# Patient Record
Sex: Female | Born: 1940
Health system: Southern US, Community
[De-identification: ages and names within clinical notes are randomized; demographics above are authoritative.]

## PROBLEM LIST (undated history)

## (undated) DIAGNOSIS — B182 Chronic viral hepatitis C: Secondary | ICD-10-CM

## (undated) DIAGNOSIS — I10 Essential (primary) hypertension: Secondary | ICD-10-CM

## (undated) DIAGNOSIS — Z9289 Personal history of other medical treatment: Secondary | ICD-10-CM

## (undated) DIAGNOSIS — J189 Pneumonia, unspecified organism: Secondary | ICD-10-CM

## (undated) DIAGNOSIS — R002 Palpitations: Secondary | ICD-10-CM

## (undated) DIAGNOSIS — E785 Hyperlipidemia, unspecified: Secondary | ICD-10-CM

## (undated) DIAGNOSIS — R519 Headache, unspecified: Secondary | ICD-10-CM

## (undated) DIAGNOSIS — R51 Headache: Secondary | ICD-10-CM

## (undated) DIAGNOSIS — M1990 Unspecified osteoarthritis, unspecified site: Secondary | ICD-10-CM

## (undated) DIAGNOSIS — E042 Nontoxic multinodular goiter: Secondary | ICD-10-CM

## (undated) HISTORY — PX: TUBAL LIGATION: SHX77

---

## 1988-03-24 DIAGNOSIS — Z9289 Personal history of other medical treatment: Secondary | ICD-10-CM

## 1988-03-24 HISTORY — DX: Personal history of other medical treatment: Z92.89

## 1997-12-06 ENCOUNTER — Encounter: Payer: Self-pay | Admitting: Gastroenterology

## 1997-12-06 ENCOUNTER — Ambulatory Visit (HOSPITAL_COMMUNITY): Admission: RE | Admit: 1997-12-06 | Discharge: 1997-12-06 | Payer: Self-pay | Admitting: Gastroenterology

## 1997-12-27 ENCOUNTER — Other Ambulatory Visit: Admission: RE | Admit: 1997-12-27 | Discharge: 1997-12-27 | Payer: Self-pay | Admitting: Obstetrics and Gynecology

## 1999-01-10 ENCOUNTER — Other Ambulatory Visit: Admission: RE | Admit: 1999-01-10 | Discharge: 1999-01-10 | Payer: Self-pay | Admitting: Obstetrics and Gynecology

## 1999-02-25 ENCOUNTER — Encounter: Admission: RE | Admit: 1999-02-25 | Discharge: 1999-02-25 | Payer: Self-pay | Admitting: Family Medicine

## 1999-04-15 ENCOUNTER — Encounter: Payer: Self-pay | Admitting: Sports Medicine

## 1999-04-15 ENCOUNTER — Encounter: Admission: RE | Admit: 1999-04-15 | Discharge: 1999-04-15 | Payer: Self-pay | Admitting: Sports Medicine

## 1999-06-14 ENCOUNTER — Encounter (INDEPENDENT_AMBULATORY_CARE_PROVIDER_SITE_OTHER): Payer: Self-pay

## 1999-06-14 ENCOUNTER — Other Ambulatory Visit: Admission: RE | Admit: 1999-06-14 | Discharge: 1999-06-14 | Payer: Self-pay | Admitting: Obstetrics and Gynecology

## 2000-02-06 ENCOUNTER — Other Ambulatory Visit: Admission: RE | Admit: 2000-02-06 | Discharge: 2000-02-06 | Payer: Self-pay | Admitting: Obstetrics and Gynecology

## 2000-10-13 ENCOUNTER — Ambulatory Visit (HOSPITAL_COMMUNITY): Admission: RE | Admit: 2000-10-13 | Discharge: 2000-10-13 | Payer: Self-pay | Admitting: Gastroenterology

## 2000-10-13 ENCOUNTER — Encounter: Payer: Self-pay | Admitting: Gastroenterology

## 2000-10-13 ENCOUNTER — Encounter (INDEPENDENT_AMBULATORY_CARE_PROVIDER_SITE_OTHER): Payer: Self-pay | Admitting: *Deleted

## 2000-10-16 ENCOUNTER — Ambulatory Visit (HOSPITAL_COMMUNITY): Admission: RE | Admit: 2000-10-16 | Discharge: 2000-10-16 | Payer: Self-pay | Admitting: Gastroenterology

## 2001-02-08 ENCOUNTER — Other Ambulatory Visit: Admission: RE | Admit: 2001-02-08 | Discharge: 2001-02-08 | Payer: Self-pay | Admitting: Obstetrics and Gynecology

## 2001-04-06 ENCOUNTER — Encounter: Payer: Self-pay | Admitting: Family Medicine

## 2001-04-06 ENCOUNTER — Encounter: Admission: RE | Admit: 2001-04-06 | Discharge: 2001-04-06 | Payer: Self-pay | Admitting: Family Medicine

## 2002-10-03 ENCOUNTER — Ambulatory Visit (HOSPITAL_BASED_OUTPATIENT_CLINIC_OR_DEPARTMENT_OTHER): Admission: RE | Admit: 2002-10-03 | Discharge: 2002-10-03 | Payer: Self-pay | Admitting: Surgery

## 2002-10-03 ENCOUNTER — Encounter (INDEPENDENT_AMBULATORY_CARE_PROVIDER_SITE_OTHER): Payer: Self-pay | Admitting: Specialist

## 2002-11-16 ENCOUNTER — Encounter (INDEPENDENT_AMBULATORY_CARE_PROVIDER_SITE_OTHER): Payer: Self-pay | Admitting: *Deleted

## 2002-11-16 ENCOUNTER — Ambulatory Visit (HOSPITAL_COMMUNITY): Admission: RE | Admit: 2002-11-16 | Discharge: 2002-11-16 | Payer: Self-pay | Admitting: Gastroenterology

## 2002-12-22 ENCOUNTER — Encounter: Payer: Self-pay | Admitting: Cardiology

## 2002-12-22 ENCOUNTER — Ambulatory Visit (HOSPITAL_COMMUNITY): Admission: RE | Admit: 2002-12-22 | Discharge: 2002-12-22 | Payer: Self-pay | Admitting: Cardiology

## 2003-07-21 ENCOUNTER — Encounter: Admission: RE | Admit: 2003-07-21 | Discharge: 2003-07-21 | Payer: Self-pay | Admitting: Infectious Diseases

## 2004-03-21 ENCOUNTER — Emergency Department (HOSPITAL_COMMUNITY): Admission: EM | Admit: 2004-03-21 | Discharge: 2004-03-21 | Payer: Self-pay | Admitting: Family Medicine

## 2005-01-28 ENCOUNTER — Ambulatory Visit: Payer: Self-pay | Admitting: Cardiology

## 2005-02-28 ENCOUNTER — Ambulatory Visit: Payer: Self-pay | Admitting: Gastroenterology

## 2005-05-12 ENCOUNTER — Ambulatory Visit (HOSPITAL_COMMUNITY): Admission: RE | Admit: 2005-05-12 | Discharge: 2005-05-12 | Payer: Self-pay | Admitting: "Endocrinology

## 2006-02-05 ENCOUNTER — Encounter: Admission: RE | Admit: 2006-02-05 | Discharge: 2006-02-05 | Payer: Self-pay | Admitting: Sports Medicine

## 2006-06-08 ENCOUNTER — Emergency Department (HOSPITAL_COMMUNITY): Admission: EM | Admit: 2006-06-08 | Discharge: 2006-06-09 | Payer: Self-pay | Admitting: Emergency Medicine

## 2006-07-27 ENCOUNTER — Ambulatory Visit: Payer: Self-pay | Admitting: Internal Medicine

## 2006-08-18 ENCOUNTER — Ambulatory Visit: Payer: Self-pay | Admitting: Internal Medicine

## 2006-08-18 ENCOUNTER — Encounter: Payer: Self-pay | Admitting: Internal Medicine

## 2006-08-18 ENCOUNTER — Ambulatory Visit: Payer: Self-pay

## 2007-02-09 ENCOUNTER — Ambulatory Visit: Payer: Self-pay | Admitting: Internal Medicine

## 2007-04-07 ENCOUNTER — Ambulatory Visit: Payer: Self-pay | Admitting: Internal Medicine

## 2007-04-07 LAB — CONVERTED CEMR LAB
ALT: 28 units/L (ref 0–35)
Alkaline Phosphatase: 75 units/L (ref 39–117)
Bilirubin, Direct: 0.2 mg/dL (ref 0.0–0.3)
CO2: 29 meq/L (ref 19–32)
Calcium: 9.1 mg/dL (ref 8.4–10.5)
Chloride: 105 meq/L (ref 96–112)
Cholesterol: 209 mg/dL (ref 0–200)
Creatinine, Ser: 0.6 mg/dL (ref 0.4–1.2)
Eosinophils Relative: 2.5 % (ref 0.0–5.0)
Free T4: 1.4 ng/dL (ref 0.6–1.6)
GFR calc Af Amer: 129 mL/min
Glucose, Bld: 95 mg/dL (ref 70–99)
HDL: 41.2 mg/dL (ref >39.0)
Hemoglobin: 12.9 g/dL (ref 12.0–15.0)
Lymphocytes Relative: 38.6 % (ref 12.0–46.0)
MCHC: 34.3 g/dL (ref 30.0–36.0)
MCV: 90.3 fL (ref 78.0–100.0)
Neutrophils Relative %: 51.6 % (ref 43.0–77.0)
Potassium: 3.9 meq/L (ref 3.5–5.1)
RBC: 4.15 M/uL (ref 3.87–5.11)
RDW: 12.3 % (ref 11.5–14.6)
Total CHOL/HDL Ratio: 5.1
VLDL: 11 mg/dL (ref 0–40)
WBC: 3.7 10*3/uL — ABNORMAL LOW (ref 4.5–10.5)

## 2008-03-02 ENCOUNTER — Ambulatory Visit: Payer: Self-pay | Admitting: Internal Medicine

## 2008-12-08 ENCOUNTER — Encounter: Admission: RE | Admit: 2008-12-08 | Discharge: 2008-12-08 | Payer: Self-pay | Admitting: Sports Medicine

## 2008-12-29 ENCOUNTER — Encounter: Admission: RE | Admit: 2008-12-29 | Discharge: 2008-12-29 | Payer: Self-pay | Admitting: Sports Medicine

## 2009-01-03 ENCOUNTER — Telehealth: Payer: Self-pay | Admitting: *Deleted

## 2009-01-03 ENCOUNTER — Encounter (INDEPENDENT_AMBULATORY_CARE_PROVIDER_SITE_OTHER): Payer: Self-pay | Admitting: *Deleted

## 2009-01-17 ENCOUNTER — Ambulatory Visit: Admission: RE | Admit: 2009-01-17 | Discharge: 2009-01-17 | Payer: Self-pay | Admitting: Family Medicine

## 2009-01-17 ENCOUNTER — Ambulatory Visit: Payer: Self-pay | Admitting: Vascular Surgery

## 2009-01-17 ENCOUNTER — Encounter (INDEPENDENT_AMBULATORY_CARE_PROVIDER_SITE_OTHER): Payer: Self-pay | Admitting: Family Medicine

## 2009-07-03 DIAGNOSIS — R059 Cough, unspecified: Secondary | ICD-10-CM | POA: Insufficient documentation

## 2009-07-03 DIAGNOSIS — R05 Cough: Secondary | ICD-10-CM | POA: Insufficient documentation

## 2009-07-09 ENCOUNTER — Ambulatory Visit: Payer: Self-pay | Admitting: Internal Medicine

## 2009-07-09 DIAGNOSIS — M5412 Radiculopathy, cervical region: Secondary | ICD-10-CM | POA: Insufficient documentation

## 2009-07-09 DIAGNOSIS — R002 Palpitations: Secondary | ICD-10-CM | POA: Insufficient documentation

## 2009-07-09 DIAGNOSIS — M1710 Unilateral primary osteoarthritis, unspecified knee: Secondary | ICD-10-CM | POA: Insufficient documentation

## 2009-07-09 DIAGNOSIS — E785 Hyperlipidemia, unspecified: Secondary | ICD-10-CM

## 2009-07-09 DIAGNOSIS — E042 Nontoxic multinodular goiter: Secondary | ICD-10-CM

## 2009-07-09 DIAGNOSIS — Z8619 Personal history of other infectious and parasitic diseases: Secondary | ICD-10-CM

## 2009-10-24 ENCOUNTER — Ambulatory Visit: Payer: Self-pay | Admitting: Internal Medicine

## 2009-12-05 ENCOUNTER — Encounter: Admission: RE | Admit: 2009-12-05 | Discharge: 2009-12-05 | Payer: Self-pay | Admitting: Sports Medicine

## 2010-04-23 NOTE — Assessment & Plan Note (Signed)
Summary: Office Visit: Acute Cough   Vital Signs:  Patient profile:   70 year old female O2 Sat:      96 % on Room air Temp:     99.0 degrees F (37.22 degrees C) oral Pulse rate:   88 / minute BP sitting:   131 / 81  (left arm) Cuff size:   large  Vitals Entered By: Cynda Familia Duncan Dull) (July 09, 2009 9:10 AM)  O2 Flow:  Room air  Referring Provider:  Ulyess Mort  CC:  Acute Cough.  History of Present Illness: [name] is a 70 y.o. woman who presents with a chief complaint of acute cough X 6 days.  The cough is generally non-productive although on occasion will produce a tannish sputum.  The cough preceeded a feeling of generalized malaise and muscle aches and has been associated with a subjective, but not objective fever.  She also denies any shakes, chills, shortness of breath, chest pain, night sweats, weight loss, significant dysnpea, wheezes, or heart burn.  She has had recent sinus headaches and does have a clear sinus drainage which she does not perceive running posteriorly.  She has had some sick contacts at work with regards to "colds", had a recent negative PPD (June 2010) and a tetenus booster with a pertussis component about 1 month ago.  She also notes a previous history of this coughing fits every few years in the past after a presumed viral infection which can take up to 6 weeks to spontaneously resolve.  This episode feels "wetter" and symptomatically more significant than the others and makes getting a good night sleep impossible.  Thus far, she has taken, sudefed, claritan, motrin, and as needed albuterol MDI with minimal relief in her symptoms.  About 1 week ago she had a significant pain in her knee which was treated with intra-articular steroids just prior to the development of the cough but has not received any perceived benefit outside of the knee from this injection.  She does not smoke, is not on an ACE inhibitor, and has not had any recent CXRs.  She is w/o  other complaints at this time.  Preventive Screening-Counseling & Management  Alcohol-Tobacco     Smoking Status: never  Problems Prior to Update: 1)  Cough  (ICD-786.2) 2)  Hepatitis C, Chronic  (ICD-070.54) 3)  Spondylosis, Cervical, With Radiculopathy  (ICD-723.4) 4)  Osteoarthritis, Knee, Left  (ICD-715.96) 5)  Palpitations, Recurrent  (ICD-785.1) 6)  Hyperlipidemia  (ICD-272.4) 7)  Nontoxic Multinodular Goiter  (ICD-241.1)  Medications Prior to Update: 1)  Detrol La 4 Mg  Cp24 (Tolterodine Tartrate) .... Take 1 Tablet By Mouth Once A Day  Allergies (verified): 1)  ! Macrobid 2)  ! Demerol 3)  ! Pcn  Past History:  Past medical history reviewed for relevance to current acute and chronic problems.  Social History: Smoking Status:  never  Physical Exam  General:  Well-developed,well-nourished,in no acute distress; alert,appropriate and cooperative throughout examination Head:  Normocephalic and atraumatic without obvious abnormalities. Mouth:  Oropharynx without lesions or exudates.  Teeth in good repair.  Uvula appears red, especially at tip. Neck:  No deformities, masses, or tenderness noted. Lungs:  No dullness to percussion.  Normal fremitus.  Right sided focal wheezing near scapula, bibasilar inspiratory crackles, no wheezes, rhonchi, or rales. Cervical Nodes:  No lymphadenopathy noted Psych:  Cognition and judgment appear intact. Alert and cooperative with normal attention span and concentration. No apparent delusions, illusions, hallucinations  Impression & Recommendations:  Problem # 1:  COUGH (ICD-786.2) Acute cough likely secondary to an upper airway cough syndrome from a viral URI.  Differential includes airway irritation from an acute viral bronchitis.  She may benefit from the anticholenergic effects of a first generation antihistamine, the anti-prostiglandin effects of an NSAID, and the bronchodilatory effects of a beta agonist.  There is no indication  for steroids, antibiotics, or a CXR at this time given the history, examination, and working diagnosis although this may change if her course becomes unusual.  She was started on chlorphenirimine 4 mg by mouth Q8H as needed and advised of the potential sleepiness that may occur with this medication.  With this in mind, she may want to take the first dose tonight before bed (which may help her sleep).  She was also asked to purchase Naproxen over the counter and take 500 mg by mouth Q12H as needed with food for both her cough and aches and pains as this NSAID has been found to benefit the cough associated with the common cold.  She was also advised to take Albuterol 2 puffs Q6H as needed for dysnpnea and cough.  Inhaler technique was reviewed and the use of a spacer was stressed.  If there is deterioration in her symptoms, the development of other symptoms including fevers, increased purulence, or no improvement in symptoms she was advised to contact me for re-review of her symptoms and possible additional evaluation.  Otherwise, if her symptoms improved she could follow-up with her primary care provider.  Complete Medication List: 1)  Detrol La 4 Mg Cp24 (Tolterodine tartrate) .... Take 1 tablet by mouth once a day 2)  Metoprolol Tartrate 50 Mg Tabs (Metoprolol tartrate) .... Take 1 tablet by mouth once a day 3)  Synthroid 75 Mcg Tabs (Levothyroxine sodium) 4)  Simvastatin 20 Mg Tabs (Simvastatin) .... Take 1 tablet by mouth once a day 5)  Chlorpheniramine Maleate 4 Mg Tabs (Chlorpheniramine maleate) .Marland Kitchen.. 1 tablet by mouth every eight hours as needed for cough 6)  Ventolin Hfa 108 (90 Base) Mcg/act Aers (Albuterol sulfate) .... Inhale 2 puffs four times a day as needed for cough 7)  Aerochamber W/flowsignal Misc (Spacer/aero-holding chambers) .... Use with inhaler  Patient Instructions: 1)  Get plenty of rest, drink lots of clear liquids, and use Naproxen for fever and comfort. Return in 7-10 days if  you're not better:sooner if you're feeling worse. 2)  Take over the counter cough and cold medications only as directed on the package insert. DO NOT take more than recommended . 3)  Acute sinusitis symptoms for less than 10 days are not helped by antibiotics.Use warm moist compresses, and over the counter decongestants ( only as directed). Call if no improvement in 5-7 days, sooner if increasing pain, fever, or new symptoms. 4)  Please schedule a follow-up appointment with me as needed, otherwise, schedule an appointment with your primary doctor.   Prescriptions: AEROCHAMBER W/FLOWSIGNAL  MISC (SPACER/AERO-HOLDING CHAMBERS) use with inhaler  #1 x 0   Entered and Authorized by:   Doneen Poisson MD   Signed by:   Doneen Poisson MD on 07/09/2009   Method used:   Electronically to        [facility]. [phone]* (retail)       [phone]       [place], [place]  [address]       Ph: [phone]  Fax: [phone]   RxID:   [id number] VENTOLIN HFA 108 (90 BASE) MCG/ACT AERS (ALBUTEROL SULFATE) Inhale 2 puffs four times a day as needed for cough  #1 x 1   Entered and Authorized by:   Doneen Poisson MD   Signed by:   Doneen Poisson MD on 07/09/2009   Method used:   Electronically to        [facility]. [phone]* (retail)       [phone]       [place], [place]  [address]       Ph: [phone]       Fax: [phone]   RxID:   [id number] CHLORPHENIRAMINE MALEATE 4 MG TABS (CHLORPHENIRAMINE MALEATE) 1 tablet by mouth every eight hours as needed for cough  #60 x 0   Entered and Authorized by:   Doneen Poisson MD   Signed by:   Doneen Poisson MD on 07/09/2009   Method used:   Electronically to        [facility]. [phone]* (retail)       [phone]       [place], [place]  [address]       Ph: [phone]       Fax: [phone]   RxID:   [id number]

## 2010-04-23 NOTE — Assessment & Plan Note (Signed)
Summary: f2y/jss  Medications Added IBUPROFEN 200 MG TABS (IBUPROFEN) 3 tabs once daily      Allergies Added:   Visit Type:  Follow-up Referring Provider:  Ulyess Mort Primary Provider:  Dr Hall Busing  CC:  no complaints.  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  She also has multinodular goiter and chronic hepatitis C as well as hyperlipidemia.  She returns today for routine followup.   Doing well from cardiac standpoint. No signifcant palpitations. Have problems with arthritis and making it very difficult to walk her dogs. No CP or SOB.   Current Medications (verified): 1)  Detrol La 4 Mg  Cp24 (Tolterodine Tartrate) .... Take 1 Tablet By Mouth Once A Day 2)  Metoprolol Tartrate 50 Mg Tabs (Metoprolol Tartrate) .... Take 1 Tablet By Mouth Once A Day 3)  Synthroid 75 Mcg Tabs (Levothyroxine Sodium) 4)  Simvastatin 20 Mg Tabs (Simvastatin) .... Take 1 Tablet By Mouth Once A Day 5)  Ventolin Hfa 108 (90 Base) Mcg/act Aers (Albuterol Sulfate) .... Inhale 2 Puffs Four Times A Day As Needed For Cough 6)  Ibuprofen 200 Mg Tabs (Ibuprofen) .... 3 Tabs Once Daily  Allergies (verified): 1)  ! Macrobid 2)  ! Demerol 3)  ! Pcn  Past History:  Past Medical History: 1. Hyperlipidemia 2. Palpatations -PACs & PVCs 3. Chronic Hep C 4. Osteoarthritis, severe  Review of Systems       As per HPI and past medical history; otherwise all systems negative.   Vital Signs:  Patient profile:   70 year old female Height:      66 inches Weight:      171 pounds BMI:     27.70 Pulse rate:   60 / minute BP sitting:   118 / 82  (right arm) Cuff size:   regular  Vitals Entered By: Hardin Negus, RMA (October 24, 2009 4:26 PM)  Physical Exam  General:  Well appearing. no resp difficulty HEENT: normal Neck: supple. no JVD. Carotids 2+ bilat; no bruits. No lymphadenopathy or  thryomegaly appreciated. Cor: PMI nondisplaced. Regular rate & rhythm. No rubs, gallops, murmur. Lungs: clear Abdomen: soft, nontender, nondistended.  Extremities: no cyanosis, clubbing, rash, edema Neuro: alert & orientedx3, cranial nerves grossly intact. moves all 4 extremities w/o difficulty. affect pleasant    Impression & Recommendations:  Problem # 1:  PALPITATIONS, RECURRENT (ICD-785.1) Quiescent. Continue current regimen.   Other Orders: EKG w/ Interpretation (93000)

## 2010-08-06 NOTE — Assessment & Plan Note (Signed)
[facility]                            CARDIOLOGY OFFICE NOTE   Doris, Zimmerman                       MRN:          [id number]  DATE:02/09/2007                            DOB:          03/16/1941    PRIMARY CARE PHYSICIAN:  Carola J. Gerri Spore, M.D. at [facility] at  [place].   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  She returns today for routine followup.   Overall, she is doing very well.  She is staying very active walking,  mostly with walking her dogs up to 5-6 miles at a time. She has not had  any chest pain or shortness of breath.  She does continue to have very  short runs of SVT and PVCs.  These are not a significant annoyance to  her.  When she is active, she feels that these are much less prominent.  She has been doing very well to watch her caffeine and only drinks one  cup of tea a day.  She has been taking 25 mg of Toprol every day.  She  has not had syncope or presyncope.  She did have a recent stress  echocardiogram, which was normal.   CURRENT MEDICATIONS:  1. Toprol 25 mg a day.  2. Synthroid 88 mcg a day.  3. Detrol LA.   PHYSICAL EXAMINATION:  She is well-appearing in no acute distress.  She  ambulates around the clinic without any respiratory difficulty.  Blood pressure 122/81, heart rate 64, weight 169.  HEENT:  Normal.  NECK:  Supple.  There is no JVD.  Carotids are 2+ bilaterally without  any bruits.  There is no lymphadenopathy or thyromegaly.  CARDIAC:  PMI is nondisplaced.  She has a regular rate and rhythm with  no murmurs, rubs or gallops.  LUNGS:  Clear.  ABDOMEN:  Soft, nontender, nondistended.  Good bowel sounds.  EXTREMITIES:  Warm with no clubbing, cyanosis or edema.  No rash.  NEURO:  She is alert and oriented x3.  Cranial nerves II-XII are intact.  Moves all four  extremities without difficulty.  Affect is very pleasant.   EKG shows sinus rhythm with occasional PVCs at a rate of 64.  She has a  nonspecific ST-T wave abnormalities laterally.  These are unchanged from  previous.   ASSESSMENT/PLAN:  1. Premature atrial contractions and premature ventricular      contractions.  These are chronic and benign, fairly well      controlled.  Told her that if she continues to have symptoms, she      can increase her Toprol to 50 mg a day and see if this helps.  I      will also check her thyroid panel to make sure these numbers are      good.  2. Routine cardiac screening.  She is overdue for a lipid panel.  We      will check this and forward the results to  Dr. Gerri Spore.   DISPOSITION:  She will return to the clinic in nine months for routine  followup.     Bevelyn Buckles. Bensimhon, MD  Electronically Signed    DRB/MedQ  DD: 02/09/2007  DT: 02/09/2007  Job #: [id number]

## 2010-08-06 NOTE — Assessment & Plan Note (Signed)
[facility]                            CARDIOLOGY OFFICE NOTE   Doris, Zimmerman                       MRN:          [id number]  DATE:03/02/2008                            DOB:          11/08/1940    PRIMARY CARE PHYSICIAN:  Carola J. Gerri Spore, MD at [facility] at  [place].   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  She  also has multinodular goiter and chronic hepatitis C as well as  hyperlipidemia.  She returns today for routine followup.   Overall, she is doing very well, although she has had a stressful year.  She stays active with walking.  She has occasional ectopy, but no  sustained arrhythmias.  No chest pain or shortness of breath.  She has  not had any syncope or presyncope.  She had a recent stress  echocardiogram, which was normal.  She has been somewhat concerned about  taking statin due to her hepatitis C, but had communications.  She did  talk to people over at [facility], and they felt this is safe as long as her  LFTs are monitored very closely.  I think, this is important given her  LDL of 150.   CURRENT MEDICATIONS:  1. Detrol LA 4 mg a day.  2. Synthroid 88 mcg a day.  3. Toprol-XL 50 mg a day.   PHYSICAL EXAMINATION:  GENERAL:  She is well appearing in no acute  distress, ambulatory around the clinic, without any respiratory  difficulty.  VITAL SIGNS:  Blood pressure is 126/84, heart rate is 52, weight is 170.  HEENT:  Normal.  NECK:  Supple.  No JVD.  Carotids are 2+ bilaterally without any bruits.  There is no lymphadenopathy or thyromegaly.  CARDIAC:  PMI is nondisplaced.  She has regular rate and rhythm.  No  murmurs, rubs, or gallops.  LUNGS:  Clear.  ABDOMEN:  Soft, nontender, nondistended.  Good bowel sounds.  EXTREMITIES:  Warm with no cyanosis, clubbing, or edema.  No rash.  NEURO:  Alert and oriented x3.  Cranial  nerves II through XII are  grossly intact.  Moves all 4 extremities without difficulty.  Affect is  pleasant.   EKG shows sinus bradycardia at a rate of 52 with nonspecific ST  depression throughout, this is chronic.   ASSESSMENT AND PLAN:  1. Palpitations.  She does have history of premature atrial      contractions and premature ventricular contractions.  These have      been well controlled.  Continue current therapy.  2. Hyperlipidemia.  Given her markedly elevated cholesterol, I do      think she would benefit from a statin.  She will discuss with Dr.      Gerri Spore whether or not she would like to manage her cholesterol      or whether she would like [place] to.  She will get back to [place] with      that.  I have also asked her to check  CRP.   DISPOSITION:  We will see her back in 1 year for routine followup.     Bevelyn Buckles. Bensimhon, MD  Electronically Signed    DRB/MedQ  DD: 03/02/2008  DT: 03/03/2008  Job #: [id number]

## 2010-08-09 NOTE — Assessment & Plan Note (Signed)
[facility]                            CARDIOLOGY OFFICE NOTE   [name], [name]                       MRN:          [id number]  DATE:07/27/2006                            DOB:          August 25, 1940    CARDIOLOGY NEW PATIENT EVALUATION   REASON FOR EVALUATION:  PACs and PVCs.   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  She was previously followed in our practice by [name] for history of  PACs PVCs, trigeminy and wandering atrial pacemaker.  She presents today  to establish long term care.   Overall, she says she is doing pretty well.  She continues to have  intermittent palpitations every week or so.  These are usually short  runs of about 6 to 8 beats and then resolved.  She denies any associated  chest pain or shortness of breath, she denies syncope or presyncope.  Nevertheless, these remain a nuisance for her.  She remains quite  active.  Many weekends she walks 6 to 8 miles a day on the trails with  her dogs and her husband at a fairly brisk pace without any problems  She does her best to limit caffeine.   In March she experienced a febrile illness, associated nausea and  weakness.  She went to the ER, her temperature was 101.5.  Her systolic  blood pressure was in the 80s.  This was associated with some chest pain  which lasted for an hour to an hour and a half.  She thought this might  be related to esophageal spasm.  She ended up vomiting with a great  relief of her chest pain.  She has not had a further followup of that.  She denies any further GI problems.  She is followed by [name]  for a history of chronic Hepatitis C.   PAST MEDICAL HISTORY:  1. Frequent PACs, PVCs as well as trigeminy and wandering atrial      pacemaker.      a.     Previous echocardiogram September 2004 showing ejection       fraction 60% with no  significant valvular abnormalities.  2. Multinodular goiter.  3. Chronic Hepatitis C.   CURRENT MEDICATIONS:  1. Atenolol 50 mg daily.  2. Detrol LA 4 mg daily.  3. Synthroid 88 mcg daily.   ALLERGIES:  To PENICILLIN, MACRODANTIN, DEMEROL and __________   REVIEW OF SYSTEMS:  Generally negative except for HPI and problem list.   SOCIAL HISTORY:  She lives with her husband.  She is a TB control nurse.  She does not smoke.   FAMILY HISTORY:  Her father had COPD.  There was heart disease in both  her mother and her father.  I do not have the full details.   PHYSICAL EXAMINATION:  She is well-appearing, she ambulates around the  clinic without any respiratory difficulty.  Blood pressure is 104/78, heart rate is __________  weight is 167.  HEENT:  Is normal.  NECK:  No JVD.  Carotids are 2+ bilaterally without any bruits.  There  is no lymphadenopathy or thyromegaly appreciated.  CARDIAC:  She is bradycardic and regular.  PMI is nondisplaced.  There  is no murmur, rub or gallop. No click.  LUNGS:  Are clear.  ABDOMEN:  Is soft, nontender, nondistended, there is no  hepatosplenomegaly, no bruits, no masses, good bowel sounds.  EXTREMITIES:  Are warm with no cyanosis, clubbing or edema.  No rash.  Good distal pulses.  NEURO:  Alert and oriented x3.  Cranial nerves II through XII are intact  and moves all 4 extremities without difficulty.  Affect is pleasant.   EKG shows marked sinus bradycardia at a rate of __________  with non-  specific ST-T wave scooping inferior laterally which is essentially  unchanged from previous.  QRS restoration is 88 milliseconds.  Axis is  normal.   ASSESSMENT AND PLAN:  1. Palpitations.  These appear quite stable; however, given they      remain a nuisance to her, we will go ahead and check a 48 hour      Holter monitor to clearly assess her arrhythmia burden.  She will      continue to limit caffeine.  Her Synthroid  was recently adjusted      as  her TSH was quite suppressed.  Given her current bradycardia,      and my distaste for atenolol we will go ahead and switch her over      to Toprol XL 25 mg a day.  2. Chest pain.  This sounds non-cardiac.  However, we will go ahead      and set her up for a stress echocardiogram to further evaluate.   DISPOSITION:  We will see her back in 6 months for routine followup.     Doris Buckles. Bensimhon, MD     DRB/MedQ  DD: 07/27/2006  DT: 07/27/2006  Job #: [id number]

## 2010-08-09 NOTE — Op Note (Signed)
   NAME:  [name], [name]                          ACCOUNT NO.:  [id number]   MEDICAL RECORD NO.:  [id number]                   PATIENT TYPE:  AMB   LOCATION:  [facility]                                  FACILITY:  [facility]   PHYSICIAN:  Currie Paris, M.D.           DATE OF BIRTH:  [birth year]   DATE OF PROCEDURE:  10/03/2002  DATE OF DISCHARGE:                                 OPERATIVE REPORT   PREOPERATIVE DIAGNOSIS:  Subareolar right breast mass.   POSTOPERATIVE DIAGNOSIS:  Subareolar right breast mass.   OPERATION:  Excision subareolar breast mass.   SURGEON:  Currie Paris, M.D.   ANESTHESIA:  Local.   CLINICAL HISTORY:  The patient is a 70 year old with a tiny mass in the  areolar area just near the areolar border at about the 3 to 4 o'clock  position. It was BB sized.   DESCRIPTION OF PROCEDURE:  In the minor procedure room the area itself was  identified  and marked. It was anesthetized with about 3 mL of 1% Xylocaine  with epinephrine and then I waited  several minutes and then prepped it.   I made a small incision and the mass appeared  to be subcutaneous and I  think was just some fibrous breast tissue. We dissected down to this mainly  fatty tissue and the area in question continued to try to move away but I  eventually was able to get a hemostat fixation to pull it up and excise what  I believed to be the mass, although this was never really a clearly well  circumscribed mass as I had anticipated.   Once I had this area out I carefully palpated both the skin and subcutaneous  tissue and breast tissue to make sure that the previously felt mass which I  was able to palpate once I made the incision was gone and there was no  evidence that it had moved away from [place]. I then closed with some 3-0  Monocryl and Dermabond. The patient tolerated the procedure well.                                               Currie Paris, M.D.    CJS/MEDQ  D:  10/03/2002  T:   10/03/2002  Job:  [id number]

## 2010-08-09 NOTE — Op Note (Signed)
   NAME:  [name], [name]                          ACCOUNT NO.:  [id number]   MEDICAL RECORD NO.:  [id number]                   PATIENT TYPE:  AMB   LOCATION:  ENDO                                 FACILITY:  [facility]   PHYSICIAN:  Bernette Redbird, M.D.                DATE OF BIRTH:  27-Feb-1941   DATE OF PROCEDURE:  11/16/2002  DATE OF DISCHARGE:                                 OPERATIVE REPORT   PROCEDURE:  Colonoscopy with polypectomy.   ENDOSCOPIST:  Florencia Reasons, M.D.   INDICATIONS:  A 70 year old female for colon cancer screening.   FINDINGS:  Two diminutive polyps.  Minimal sigmoid diverticulosis.   INFORMED CONSENT:  The nature, purpose and risks of the procedure had been  discussed with the patient who provided written consent.   SEDATION:  Fentanyl 50 mcg and Versed 5 mg IV without arrhythmias or  desaturation.   DESCRIPTION OF PROCEDURE:  The Olympus adjustable tension pediatric video  colonoscope was readily advanced to the terminal ileum, using just a little  bit of external abdominal compression to control looping.  Pull back was  then performed.  The quality of the prep very good, and it was felt that all  areas were well seen.  There was a tiny 2 mm sessile polyp at the base of  the cecum removed by a single cold biopsy, and there was a 3-4 mm semi-  pedunculated polyp at about 15 cm from the proximal rectal region removed by  snare technique with complete hemostasis.  The polyp fragment was retrieved  by suctioning through the scope.   No large polyps, cancer, colitis, or vascular malformations were observed.   There was minimal sigmoid diverticulosis.   Retroflexion in the rectum and reinspection of the rectosigmoid disclosed no  additional lesions.   The patient tolerated the procedure well and there were no apparent  complications.   IMPRESSION:  1. Diminutive colon polyps removed as described above.  2. Minimal sigmoid diverticulosis.    PLAN:   Await pathology results.  Follow up colonoscopy in five years if  either polyp is adenomatous in character, otherwise consider possible  screening flexible sigmoidoscopy in five years.                                               Bernette Redbird, M.D.    RB/MEDQ  D:  11/16/2002  T:  11/16/2002  Job:  [id number]   cc:   Otilio Connors. Gerri Spore, M.D.  [address]  [place]  [place] [address]  Fax: [phone]

## 2011-09-15 ENCOUNTER — Encounter: Payer: Self-pay | Admitting: Internal Medicine

## 2011-09-15 ENCOUNTER — Ambulatory Visit (HOSPITAL_COMMUNITY)
Admission: RE | Admit: 2011-09-15 | Discharge: 2011-09-15 | Disposition: A | Discharge status: Home / Self Care | Payer: BC Managed Care – PPO | Source: Ambulatory Visit | Attending: Internal Medicine | Admitting: Internal Medicine

## 2011-09-15 ENCOUNTER — Ambulatory Visit: Payer: BC Managed Care – PPO | Admitting: *Deleted

## 2011-09-15 ENCOUNTER — Other Ambulatory Visit: Payer: Self-pay | Admitting: Internal Medicine

## 2011-09-15 DIAGNOSIS — R002 Palpitations: Secondary | ICD-10-CM

## 2011-09-15 DIAGNOSIS — B182 Chronic viral hepatitis C: Secondary | ICD-10-CM

## 2011-09-15 DIAGNOSIS — R9431 Abnormal electrocardiogram [ECG] [EKG]: Secondary | ICD-10-CM | POA: Insufficient documentation

## 2011-09-15 NOTE — Progress Notes (Signed)
Patient ID: [name], female   DOB: Jul 24, 1940, 71 y.o.   MRN: [id number]

## 2011-09-23 ENCOUNTER — Telehealth: Payer: Self-pay | Admitting: Internal Medicine

## 2011-09-23 NOTE — Telephone Encounter (Signed)
New Problem:    Patient called in wanting to know if she was still eligible to see Dr. Gala Romney.  Please let me know.

## 2011-09-23 NOTE — Telephone Encounter (Signed)
Spoke with pt, aware dr bensimhon does want to see the pt at the hosp. Number given for pt to call and make appt.

## 2011-09-23 NOTE — Telephone Encounter (Signed)
Spoke with pt, aware have sent a message to find out form dr bensimhon.

## 2011-11-03 ENCOUNTER — Ambulatory Visit (HOSPITAL_COMMUNITY)
Admission: RE | Admit: 2011-11-03 | Discharge: 2011-11-03 | Disposition: A | Discharge status: Home / Self Care | Payer: BC Managed Care – PPO | Source: Ambulatory Visit | Attending: Internal Medicine | Admitting: Internal Medicine

## 2011-11-03 ENCOUNTER — Encounter (HOSPITAL_COMMUNITY): Payer: Self-pay

## 2011-11-03 VITALS — BP 122/80 | HR 66 | Resp 18 | Ht 66.0 in | Wt 164.0 lb

## 2011-11-03 DIAGNOSIS — E785 Hyperlipidemia, unspecified: Secondary | ICD-10-CM | POA: Insufficient documentation

## 2011-11-03 DIAGNOSIS — R002 Palpitations: Secondary | ICD-10-CM | POA: Insufficient documentation

## 2011-11-03 DIAGNOSIS — I951 Orthostatic hypotension: Secondary | ICD-10-CM

## 2011-11-03 HISTORY — DX: Hyperlipidemia, unspecified: E78.5

## 2011-11-03 HISTORY — DX: Unspecified osteoarthritis, unspecified site: M19.90

## 2011-11-03 HISTORY — DX: Chronic viral hepatitis C: B18.2

## 2011-11-03 HISTORY — DX: Palpitations: R00.2

## 2011-11-03 HISTORY — DX: Nontoxic multinodular goiter: E04.2

## 2011-11-03 NOTE — Assessment & Plan Note (Signed)
--   Stable.  Continue metoprolol. 

## 2011-11-03 NOTE — Patient Instructions (Addendum)
We will contact you in 12 months to schedule your next appointment.

## 2011-11-03 NOTE — Assessment & Plan Note (Signed)
Lipid look fine off simva. Would continue to hold statin while getting treatment for HCV.

## 2011-11-03 NOTE — Assessment & Plan Note (Signed)
Advised to avoid the heat and keep well hydrated. If persists can wear compression hose.

## 2011-11-03 NOTE — Addendum Note (Signed)
Encounter addended by: Noralee Space, RN on: 11/03/2011  9:52 AM<BR>     Documentation filed: Patient Instructions Section

## 2011-11-03 NOTE — Progress Notes (Signed)
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 She also has multinodular goiter and chronic hepatitis C as well as hyperlipidemia. She returns today for routine followup.   Stress echo 5/08: normal  Just retired from [employer]. Currently undergoing experimental treatment for her Hep C at [facility] with apparent good result.   Doing well from cardiac standpoint. Occasional palpitations mostly at night. Brief. Can terminate with cough. Nothing sustained. Have problems with arthritis and making it very difficult to walk her dogs. Still walking 2.5 miles in 45-50 minutes. No CP or SOB.   In heat and humidity notices she gets dizzy when standing up.  Off simva now due to increased LFTs. Most recent lipids. 3/13: TC 172 TRIG 77 HDL 37 LDL 124  ROS: All systems negative except as listed in HPI, PMH and Problem List.  Past Medical History  Diagnosis Date  . Hyperlipidemia   . Palpitations     PACs & PVCs  . Chronic hepatitis C   . Osteoarthritis   . Multinodular goiter     Current Outpatient Prescriptions  Medication Sig Dispense Refill  . DETROL LA 4 MG 24 hr capsule       . levothyroxine (SYNTHROID, LEVOTHROID) 75 MCG tablet       . metoprolol succinate (TOPROL-XL) 50 MG 24 hr tablet          PHYSICAL EXAM: Filed Vitals:   11/03/11 0910  BP: 122/80  Pulse: 66  Resp: 18   Weight change:  General:  Thin. Well appearing. No resp difficulty HEENT: normal Neck: supple. JVP flat. Carotids 2+ bilaterally; no bruits. No lymphadenopathy or thryomegaly appreciated. Cor: PMI normal. Regular rate & rhythm. No rubs, gallops or murmurs. Lungs: clear Abdomen: soft, nontender, nondistended. No hepatosplenomegaly. No bruits or masses. Good bowel sounds. Extremities: no cyanosis, clubbing, rash, edema Neuro: alert & oriented x 3, cranial nerves grossly intact. Moves all 4 extremities w/o difficulty. Affect  pleasant.    ECG: SR 65. Diffuse mild; non-specific ST depression (no change) QTc 399   ASSESSMENT & PLAN:

## 2011-11-04 NOTE — Addendum Note (Signed)
Encounter addended by: Cresenciano Genre on: 11/04/2011  8:44 AM<BR>     Documentation filed: Charges VN

## 2012-03-24 DIAGNOSIS — B182 Chronic viral hepatitis C: Secondary | ICD-10-CM

## 2012-03-24 HISTORY — DX: Chronic viral hepatitis C: B18.2

## 2012-04-07 ENCOUNTER — Ambulatory Visit (INDEPENDENT_AMBULATORY_CARE_PROVIDER_SITE_OTHER): Payer: Medicare PPO | Admitting: Sports Medicine

## 2012-04-07 ENCOUNTER — Encounter: Payer: Self-pay | Admitting: Sports Medicine

## 2012-04-07 ENCOUNTER — Ambulatory Visit
Admission: RE | Admit: 2012-04-07 | Discharge: 2012-04-07 | Disposition: A | Discharge status: Home / Self Care | Payer: Medicare PPO | Source: Ambulatory Visit | Attending: Sports Medicine | Admitting: Sports Medicine

## 2012-04-07 VITALS — Ht 66.0 in | Wt 165.0 lb

## 2012-04-07 DIAGNOSIS — M25569 Pain in unspecified knee: Secondary | ICD-10-CM

## 2012-04-07 DIAGNOSIS — M25469 Effusion, unspecified knee: Secondary | ICD-10-CM

## 2012-04-07 NOTE — Progress Notes (Signed)
  Subjective:    Patient ID: [name], female    DOB: 1940-05-26, 72 y.o.   MRN: [id number]  HPI chief complaint: Left knee pain  Very pleasant 72 year old female comes in today complaining of several months of left knee pain. She has a history of knee pain in the past. She had a Baker's cyst in the left knee aspirated and injected in 2011. She has also had intra-articular cortisone injections in the left knee in the past. Her current pain is mainly in the posterior aspect of her knee. It is intermittent. Throbbing in quality. Pain is most noticeable with going downstairs but she also gets pain with prolonged walking. No real mechanical symptoms. She had a Baker's cyst in the right knee which ruptured a few years ago and she's not had any further problem in the right knee since. I've treated her in the past at [facility]. She is currently using ibuprofen with some relief. He denies any recent trauma. She's noticed some intermittent swelling.  Past medical history and current medications are reviewed. Drug allergies are reviewed Socially, does not smoke, is one glass of alcohol 4-5 times a week, and is retired    Review of Systems     Objective:   Physical Exam Well-developed, well-nourished. No acute distress. Awake alert and oriented x3. Vital signs are reviewed.  Left knee: Range of motion shows about a 3 extension lag with 120 of flexion. Trace effusion. There is a palpable Baker's cyst in the popliteal fossa and it is tender to palpation. 1+ patellofemoral crepitus. Slight tenderness to palpation along the medial joint line. No tenderness along the lateral joint line. Negative McMurray's. Knee is stable to ligamentous exam. Neurovascular intact distally. Walking with a slight limp.  Right knee: Full range of motion. No effusion. No tenderness along the medial or lateral joint lines. Good ligamentous stability. Neurovascularly intact distally,  MSK ultrasound of the  left knee: Images were obtained in both long and short views. There is a moderate size joint effusion. There also appears to be some calcification along the periphery of the medial meniscus which may be consistent with chondrocalcinosis. There is a rather large a Baker's cyst in the popliteal fossa.       Assessment & Plan:  1. Left knee pain and swelling likely secondary to DJD with concomitant Baker's cyst  I recommended that we try an intra-articular cortisone injection today. She has had better results with that in the past than with the Baker's cyst aspiration and injection. I want to order standing x-rays of her left knee to evaluate the degree of degenerative changes present and I've given her a body heel is compression sleeve to wear with activity. I will call her early next week ([phone]). We will discuss her x-ray and see how she is responding to today's injection. If symptoms persist, we will consider a repeat aspiration/injection of her Baker's cyst.  Consent obtained and verified. Time-out conducted. Noted no overlying erythema, induration, or other signs of local infection. Skin prepped in a sterile fashion. Topical analgesic spray: Ethyl chloride. Joint: left knee Needle: 25g 1 1/2 inch Completed without difficulty. Meds: 1cc(40mg ) depomedrol, 3cc 0.5% marcaine  Advised to call if fevers/chills, erythema, induration, drainage, or persistent bleeding.

## 2012-04-12 ENCOUNTER — Telehealth: Payer: Self-pay | Admitting: Sports Medicine

## 2012-04-12 NOTE — Telephone Encounter (Signed)
I spoke with the patient on the phone today regarding x-rays of her left knee. She has bone-on-bone medial compartmental DJD. She is feeling better after a recent intra-articular cortisone injection. Still some discomfort in the posterior knee with some radiating discomfort down the lateral lower leg due to a Baker's cyst. Since she is feeling better overall we will hold on scheduling an aspiration/injection of her Baker's cyst but if she continues to have persistent discomfort in the popliteal fossa we could reconsider that at a later date. I explained to her that the definitive treatment for her problem is a total knee arthroplasty but her symptoms are not severe enough at this point in time to warrant surgical intervention. Followup when necessary.

## 2012-07-15 ENCOUNTER — Ambulatory Visit (INDEPENDENT_AMBULATORY_CARE_PROVIDER_SITE_OTHER): Payer: Medicare PPO | Admitting: Sports Medicine

## 2012-07-15 VITALS — BP 130/80 | Ht 66.0 in | Wt 163.0 lb

## 2012-07-15 DIAGNOSIS — M1710 Unilateral primary osteoarthritis, unspecified knee: Secondary | ICD-10-CM

## 2012-07-15 DIAGNOSIS — M25562 Pain in left knee: Secondary | ICD-10-CM

## 2012-07-15 DIAGNOSIS — M25569 Pain in unspecified knee: Secondary | ICD-10-CM

## 2012-07-15 DIAGNOSIS — M1712 Unilateral primary osteoarthritis, left knee: Secondary | ICD-10-CM

## 2012-07-15 MED ORDER — METHYLPREDNISOLONE ACETATE 40 MG/ML IJ SUSP
40.0000 mg | Freq: Once | INTRAMUSCULAR | Status: AC
  Administered 2012-07-15: 40 mg via INTRA_ARTICULAR

## 2012-07-15 NOTE — Progress Notes (Signed)
  Subjective:    Patient ID: [name], female    DOB: Jul 26, 1940, 72 y.o.   MRN: [id number]  HPI Patient comes in today with returning left knee pain. Recent x-rays demonstrate bone-on-bone medial compartmental DJD. I injected her left knee back in January and this provided her with several good weeks of symptom relief. Unfortunately, her pain has returned as she has become more active in her garden. Her pain is identical to what she experienced previously. Most of her discomfort is in the posterior knee and in the proximal calf. It is intermittent but at times it really limits her ability to walk, particularly with coming down stairs. She is also experiencing intermittent posterior left hip pain which is worse with lying down. Pain at times were radiate down the left leg diffusely. She denies any groin pain. She has not noticed any weakness. She is here today with her husband.  Interim medical history is unchanged. Medications are reviewed.    Review of Systems     Objective:   Physical Exam Well-developed, well-nourished. No acute distress. Awake alert and oriented x3. Vital signs are reviewed.  Left hip: Smooth painless hip range of motion with a negative log roll. No tenderness over the greater trochanteric bursa. Negative straight leg raise. Reflexes are trace but equal at the Achilles and patellar tendons bilaterally. Strength is 5/5. Left knee: Range of motion 0-120 degrees. No effusion but there is some fullness in the popliteal fossa consistent with a returning Baker's cyst here. No joint line tenderness. Walking without a significant limp.  X-rays are as above       Assessment & Plan:  1. Left knee pain secondary to end-stage bone-on-bone medial compartmental DJD 2. Diffuse left leg pain-question possible lumbar radiculopathy  Definitive treatment for the left knee is a total knee replacement. I provided her with the name of both Dr. Despina Hick and Dr. Turner Daniels. I will reinject  her left knee today but I've explained to her that this may only be temporary. I also discussed the possibility of a custom fitted medial unloader brace. I will contact our [employer] representative and have him call her directly. She will call me when she is ready for referral to orthopedics. In regards to her diffuse left leg pain, may in fact be originating from her lumbar spine. However, her symptoms seem to be improving on her own so we will take a simple watchful waiting approach for now. She will let me know if symptoms worsen. Followup when necessary.  Consent obtained and verified. Time-out conducted. Noted no overlying erythema, induration, or other signs of local infection. Skin prepped in a sterile fashion. Topical analgesic spray: Ethyl chloride. Joint: left knee, anterior medial approach Needle: 25g 1 1/2 inch Completed without difficulty. Meds: 3cc depomedrol (40mg ), 3cc 1% lidocaine  Advised to call if fevers/chills, erythema, induration, drainage, or persistent bleeding.

## 2012-07-15 NOTE — Patient Instructions (Addendum)
DR. Despina Hick DR. Seleta Rhymes

## 2012-07-19 ENCOUNTER — Ambulatory Visit: Payer: Medicare PPO | Admitting: Sports Medicine

## 2012-09-23 ENCOUNTER — Ambulatory Visit (HOSPITAL_COMMUNITY)
Admission: RE | Admit: 2012-09-23 | Discharge: 2012-09-23 | Disposition: A | Discharge status: Home / Self Care | Payer: Medicare PPO | Source: Ambulatory Visit | Attending: Internal Medicine | Admitting: Internal Medicine

## 2012-09-23 ENCOUNTER — Encounter (HOSPITAL_COMMUNITY): Payer: Self-pay

## 2012-09-23 VITALS — BP 110/72 | HR 77 | Wt 166.0 lb

## 2012-09-23 DIAGNOSIS — M1990 Unspecified osteoarthritis, unspecified site: Secondary | ICD-10-CM | POA: Insufficient documentation

## 2012-09-23 DIAGNOSIS — I4949 Other premature depolarization: Secondary | ICD-10-CM | POA: Insufficient documentation

## 2012-09-23 DIAGNOSIS — R002 Palpitations: Secondary | ICD-10-CM

## 2012-09-23 DIAGNOSIS — B182 Chronic viral hepatitis C: Secondary | ICD-10-CM | POA: Insufficient documentation

## 2012-09-23 DIAGNOSIS — E042 Nontoxic multinodular goiter: Secondary | ICD-10-CM | POA: Insufficient documentation

## 2012-09-23 DIAGNOSIS — E785 Hyperlipidemia, unspecified: Secondary | ICD-10-CM

## 2012-09-23 DIAGNOSIS — Z79899 Other long term (current) drug therapy: Secondary | ICD-10-CM | POA: Insufficient documentation

## 2012-09-23 NOTE — Progress Notes (Signed)
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 She also has multinodular goiter and chronic hepatitis C (s/p treatment - virus free at 6 months) as well as hyperlipidemia. She returns today for routine followup.   Stress echo 5/08: normal  Doing well from cardiac standpoint. Still having problems with arthritis particularly in her L knee. Walks her dogs daily. Still walking 2.5 miles in 45-50 minutes. No CP or SOB. No palpitations. Remains on Toprol.   Off simva now due to increased LFTs in setting of HCV. Most recent lipids. 3/13: TC 172 TRIG 77 HDL 37 LDL 124. Not checked since.   ROS: All systems negative except as listed in HPI, PMH and Problem List.  Past Medical History  Diagnosis Date  . Hyperlipidemia   . Palpitations     PACs & PVCs  . Chronic hepatitis C   . Osteoarthritis   . Multinodular goiter     Current Outpatient Prescriptions  Medication Sig Dispense Refill  . DETROL LA 4 MG 24 hr capsule       . levothyroxine (SYNTHROID, LEVOTHROID) 75 MCG tablet       . metoprolol succinate (TOPROL-XL) 50 MG 24 hr tablet       . PROAIR HFA 108 (90 BASE) MCG/ACT inhaler as needed.       No current facility-administered medications for this encounter.     PHYSICAL EXAM: Filed Vitals:   09/23/12 1330  BP: 110/72  Pulse: 77   Weight change:  General:  Thin. Well appearing. No resp difficulty HEENT: normal Neck: supple. JVP flat. Carotids 2+ bilaterally; no bruits. No lymphadenopathy or thryomegaly appreciated. Cor: PMI normal. Regular rate & rhythm. No rubs, gallops or murmurs. Lungs: clear Abdomen: soft, nontender, nondistended. No hepatosplenomegaly. No bruits or masses. Good bowel sounds. Extremities: no cyanosis, clubbing, rash, tr edema L>R . L knee brace  Neuro: alert & oriented x 3, cranial nerves grossly intact. Moves all 4 extremities w/o difficulty. Affect pleasant.   ASSESSMENT &  PLAN:

## 2012-09-23 NOTE — Addendum Note (Signed)
Encounter addended by: Noralee Space, RN on: 09/23/2012  1:56 PM<BR>     Documentation filed: Patient Instructions Section

## 2012-09-23 NOTE — Assessment & Plan Note (Signed)
Quiescent. Continue b-blocker.

## 2012-09-23 NOTE — Addendum Note (Signed)
Encounter addended by: Noralee Space, RN on: 09/23/2012  1:57 PM<BR>     Documentation filed: Orders

## 2012-09-23 NOTE — Assessment & Plan Note (Signed)
Due for recheck. Will have her go to the lab next week.

## 2012-09-23 NOTE — Patient Instructions (Addendum)
Labs next week [facility], these are fasting labs  We will contact you in 2 years to schedule your next appointment.

## 2012-10-01 ENCOUNTER — Other Ambulatory Visit (INDEPENDENT_AMBULATORY_CARE_PROVIDER_SITE_OTHER): Payer: Medicare PPO

## 2012-10-01 DIAGNOSIS — E785 Hyperlipidemia, unspecified: Secondary | ICD-10-CM

## 2012-10-01 LAB — COMPLETE METABOLIC PANEL WITH GFR
ALT: 11 U/L (ref 0–35)
AST: 15 U/L (ref 0–37)
Albumin: 4.1 g/dL (ref 3.5–5.2)
Alkaline Phosphatase: 76 U/L (ref 39–117)
BUN: 12 mg/dL (ref 6–23)
CO2: 26 mEq/L (ref 19–32)
Calcium: 9.2 mg/dL (ref 8.4–10.5)
Chloride: 107 mEq/L (ref 96–112)
Creat: 0.68 mg/dL (ref 0.50–1.10)
GFR, Est African American: >89 mL/min
GFR, Est Non African American: >89 mL/min
Sodium: 140 mEq/L (ref 135–145)
Total Protein: 6.3 g/dL (ref 6.0–8.3)

## 2012-10-01 LAB — LIPID PANEL
Cholesterol: 225 mg/dL — ABNORMAL HIGH (ref 0–200)
HDL: 48 mg/dL (ref >39)
Total CHOL/HDL Ratio: 4.7 Ratio
VLDL: 15 mg/dL (ref 0–40)

## 2012-10-07 ENCOUNTER — Encounter: Payer: Self-pay | Admitting: Sports Medicine

## 2012-10-07 ENCOUNTER — Ambulatory Visit (INDEPENDENT_AMBULATORY_CARE_PROVIDER_SITE_OTHER): Payer: Medicare PPO | Admitting: Sports Medicine

## 2012-10-07 VITALS — BP 120/81 | HR 75 | Ht 66.0 in | Wt 166.0 lb

## 2012-10-07 DIAGNOSIS — M1712 Unilateral primary osteoarthritis, left knee: Secondary | ICD-10-CM

## 2012-10-07 DIAGNOSIS — IMO0002 Reserved for concepts with insufficient information to code with codable children: Secondary | ICD-10-CM

## 2012-10-07 DIAGNOSIS — M25569 Pain in unspecified knee: Secondary | ICD-10-CM

## 2012-10-07 DIAGNOSIS — M1710 Unilateral primary osteoarthritis, unspecified knee: Secondary | ICD-10-CM

## 2012-10-07 DIAGNOSIS — M25562 Pain in left knee: Secondary | ICD-10-CM

## 2012-10-07 MED ORDER — METHYLPREDNISOLONE ACETATE 40 MG/ML IJ SUSP
40.0000 mg | Freq: Once | INTRAMUSCULAR | Status: AC
  Administered 2012-10-07: 40 mg via INTRA_ARTICULAR

## 2012-10-07 NOTE — Patient Instructions (Addendum)
You have been scheduled for an appointment with Dr. Despina Hick to discuss total knee replacement

## 2012-10-07 NOTE — Progress Notes (Signed)
  Subjective:    Patient ID: [name], female    DOB: 28-Sep-1940, 72 y.o.   MRN: [id number]  HPI chief complaint: Left knee pain  Patient comes in today requesting a repeat cortisone injection in her left knee. Recent x-rays have shown bone-on-bone medial compartmental DJD. Last cortisone injection was administered back in April and did provide her with some good symptom relief. Pain recently began to return and is identical in nature to what she experienced previously. She is leaving for a trip to [place] next week. She has been fitted with a medial unloading brace and although she admits that it does help provide stability to the knee she does not think is helping much with her pain. She is ready to go ahead and meet with one of the orthopedist regarding total knee arthroplasty which is in fact her definitive treatment.    Review of Systems     Objective:   Physical Exam Well-developed, well-nourished. No acute distress  Left knee: Range of motion 0-120. No effusion. No obvious Baker's cyst. She is tender to palpation along the medial joint line but a negative McMurray's. No tenderness along the lateral joint line. Good ligamentous stability. Neurovascularly intact distally. Walking with a slight limp.  X-rays are as above       Assessment & Plan:  1. Returning left knee pain secondary to end-stage medial compartmental DJD  Patient's left knee is reinjected today utilizing an anterior medial approach. She is ready to discuss total knee arthroplasty and she would like to be referred to Dr. Berton Lan. However, [facility] is not currently accepting her insurance. It is my understanding that she may in fact be changing her insurance soon and if so we can make the referral at that time. Followup when necessary.  Consent obtained and verified. Time-out conducted. Noted no overlying erythema, induration, or other signs of local infection. Skin prepped in a sterile  fashion. Topical analgesic spray: Ethyl chloride. Joint: left knee Needle: 25g 1 1/2 inch Completed without difficulty. Meds: 4cc 1% xylocaine, 1cc (40mg ) depomedrol  Advised to call if fevers/chills, erythema, induration, drainage, or persistent bleeding.

## 2012-10-08 ENCOUNTER — Ambulatory Visit: Payer: Medicare PPO | Admitting: Sports Medicine

## 2012-10-27 ENCOUNTER — Other Ambulatory Visit: Payer: Self-pay

## 2012-10-28 ENCOUNTER — Telehealth (HOSPITAL_COMMUNITY): Payer: Self-pay | Admitting: Anesthesiology

## 2012-10-28 DIAGNOSIS — E785 Hyperlipidemia, unspecified: Secondary | ICD-10-CM

## 2012-10-28 NOTE — Telephone Encounter (Signed)
Dr. Gala Romney called patient to discuss lipid profile, will hold off for 3 months and adjust diet to see if this will help. Will recheck lipid profile November.

## 2013-01-10 ENCOUNTER — Encounter: Payer: Self-pay | Admitting: Sports Medicine

## 2013-01-10 ENCOUNTER — Ambulatory Visit (INDEPENDENT_AMBULATORY_CARE_PROVIDER_SITE_OTHER): Payer: Medicare PPO | Admitting: Sports Medicine

## 2013-01-10 VITALS — BP 122/87 | HR 63 | Ht 67.0 in | Wt 166.0 lb

## 2013-01-10 DIAGNOSIS — M1710 Unilateral primary osteoarthritis, unspecified knee: Secondary | ICD-10-CM

## 2013-01-10 DIAGNOSIS — M1712 Unilateral primary osteoarthritis, left knee: Secondary | ICD-10-CM

## 2013-01-10 MED ORDER — METHYLPREDNISOLONE ACETATE 40 MG/ML IJ SUSP
40.0000 mg | Freq: Once | INTRAMUSCULAR | Status: AC
  Administered 2013-01-10: 40 mg via INTRA_ARTICULAR

## 2013-01-10 NOTE — Progress Notes (Signed)
  Subjective:    Patient ID: [name], female    DOB: 24-Mar-1941, 72 y.o.   MRN: [id number]  HPI Patient comes in today requesting a cortisone injection into her left knee. She has a documented history of bone-on-bone medial compartmental DJD in this knee. We did discuss the possibility of total knee replacement at her last office visit. Plan at this point in time is for her to see Dr.Aluisio sometime in January. Her knee was last injected back in July and this provided her with about 6 weeks of symptom relief. Symptoms are identical to what she's experienced previously. She's getting pain and swelling primarily in the posterior aspect of her knee. She has a custom made a medial unloader brace which is somewhat helpful as well. She denies any recent trauma.  Interim medical history is unchanged Medications and allergies are reviewed    Review of Systems     Objective:   Physical Exam Well-developed, well-nourished. No acute distress.  Left knee: Range of motion 0-120. No effusion. There is some fullness in the popliteal fossa consistent with a Baker's cyst here. Tender to palpation along the medial joint line but a negative McMurray's. Good ligamentous stability. Fairly good alignment. Neurovascularly intact distally. Walking with a slight limp.       Assessment & Plan:  Returning left knee pain secondary to end-stage medial compartmental DJD  Patient's left knee is reinjected today utilizing an anterior medial approach. She understands that definitive treatment is a total knee arthroplasty. In the meantime, continue with her medial unloader brace and followup with me when necessary.  Consent obtained and verified. Time-out conducted. Noted no overlying erythema, induration, or other signs of local infection. Skin prepped in a sterile fashion. Topical analgesic spray: Ethyl chloride. Joint: left knee Needle: 25g 1.5 inch Completed without difficulty. Meds: 3cc 1%xylocaine,  1cc (40mg ) depomedrol  Advised to call if fevers/chills, erythema, induration, drainage, or persistent bleeding.

## 2013-01-27 ENCOUNTER — Other Ambulatory Visit: Payer: Self-pay

## 2013-07-01 ENCOUNTER — Ambulatory Visit (HOSPITAL_COMMUNITY)
Admission: RE | Admit: 2013-07-01 | Discharge: 2013-07-01 | Disposition: A | Discharge status: Home / Self Care | Payer: Medicare Other | Source: Ambulatory Visit | Attending: Cardiology | Admitting: Cardiology

## 2013-07-01 ENCOUNTER — Encounter (HOSPITAL_COMMUNITY): Payer: Self-pay

## 2013-07-01 VITALS — BP 120/78 | HR 60 | Ht 66.0 in | Wt 162.8 lb

## 2013-07-01 DIAGNOSIS — E785 Hyperlipidemia, unspecified: Secondary | ICD-10-CM

## 2013-07-01 DIAGNOSIS — M1990 Unspecified osteoarthritis, unspecified site: Secondary | ICD-10-CM | POA: Insufficient documentation

## 2013-07-01 DIAGNOSIS — B182 Chronic viral hepatitis C: Secondary | ICD-10-CM | POA: Insufficient documentation

## 2013-07-01 DIAGNOSIS — R002 Palpitations: Secondary | ICD-10-CM | POA: Insufficient documentation

## 2013-07-01 DIAGNOSIS — R55 Syncope and collapse: Secondary | ICD-10-CM | POA: Insufficient documentation

## 2013-07-01 DIAGNOSIS — I10 Essential (primary) hypertension: Secondary | ICD-10-CM | POA: Insufficient documentation

## 2013-07-01 DIAGNOSIS — I495 Sick sinus syndrome: Secondary | ICD-10-CM | POA: Insufficient documentation

## 2013-07-01 DIAGNOSIS — G43909 Migraine, unspecified, not intractable, without status migrainosus: Secondary | ICD-10-CM | POA: Insufficient documentation

## 2013-07-01 DIAGNOSIS — G459 Transient cerebral ischemic attack, unspecified: Secondary | ICD-10-CM

## 2013-07-01 DIAGNOSIS — R001 Bradycardia, unspecified: Secondary | ICD-10-CM

## 2013-07-01 DIAGNOSIS — E042 Nontoxic multinodular goiter: Secondary | ICD-10-CM | POA: Insufficient documentation

## 2013-07-01 DIAGNOSIS — I498 Other specified cardiac arrhythmias: Secondary | ICD-10-CM

## 2013-07-01 NOTE — Patient Instructions (Addendum)
Your physician has requested that you have a carotid duplex. This test is an ultrasound of the carotid arteries in your neck. It looks at blood flow through these arteries that supply the brain with blood. Allow one hour for this exam. There are no restrictions or special instructions. Monday 07/04/13 @ 11:00 AM  Your physician has recommended that you wear an event monitor. Event monitors are medical devices that record the heart's electrical activity. Doctors most often us these monitors to diagnose arrhythmias. Arrhythmias are problems with the speed or rhythm of the heartbeat. The monitor is a small, portable device. You can wear one while you do your normal daily activities. This is usually used to diagnose what is causing palpitations/syncope (passing out).   Your physician recommends that you schedule a follow-up appointment in: 5 WEEKS AFTER EVENT MONITOR

## 2013-07-03 DIAGNOSIS — R55 Syncope and collapse: Secondary | ICD-10-CM | POA: Insufficient documentation

## 2013-07-03 NOTE — Progress Notes (Signed)
Patient ID: [name], female   DOB: 1940/12/11, 73 y.o.   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 She also has multinodular goiter and chronic hepatitis C (s/p treatment - virus free at 6 months) as well as hyperlipidemia. She returns today for routine followup.   Stress echo 5/08: normal  Good exercise tolerance. Still having problems with arthritis particularly in her L knee. Walks her dogs daily. Still walking 2.5 miles in 45-50 minutes. No CP or SOB. No palpitations. Remains on Toprol.   She was recently started on pravastatin 40 mg daily by PCP.   Over the last month, she has had 3 episodes of presyncope.  The two initial episodes occurred while standing at the kitchen sink.  Both times, she became lightheaded and grabbed onto the counter to avoid falling.  Both lasted < 1 minute.  The third episode occurred on Monday. She was sitting in a chair, having just eaten a meal.  She felt lightheaded but did not lose consciousness.  She did not feel her heart racing.  No chest pain.  These episodes were not associated with warmth, diaphoresis, or nausea.    Patient has had episodes of atypical migraines in the past.  With these, she will get an aura with visual blurring, peripheral vision loss, and a scotoma.  The three episodes described above were not consistent with her usual aura symptoms.    ROS: All systems negative except as listed in HPI, PMH and Problem List.  Labs (7/14): LDL 162, K 4.2, creatinine 0.68 Labs (3/15): LDL 130, HDL 48  Past Medical History  Diagnosis Date  . Hyperlipidemia   . Palpitations     PACs & PVCs  . Chronic hepatitis C   . Osteoarthritis   . Multinodular goiter     Current Outpatient Prescriptions  Medication Sig Dispense Refill  . amLODipine (NORVASC) 2.5 MG tablet Take 2.5 mg by mouth daily.      Marland Kitchen DETROL LA 4 MG 24 hr capsule Take 4 mg by mouth  daily.       Marland Kitchen levothyroxine (SYNTHROID, LEVOTHROID) 75 MCG tablet Take 75 mcg by mouth daily.       . metoprolol succinate (TOPROL-XL) 50 MG 24 hr tablet Take 50 mg by mouth daily.       Marland Kitchen PROAIR HFA 108 (90 BASE) MCG/ACT inhaler as needed.       No current facility-administered medications for this encounter.     PHYSICAL EXAM: Filed Vitals:   07/01/13 1001  BP: 120/78  Pulse: 60   Weight change:  General:  Thin. Well appearing. No resp difficulty HEENT: normal Neck: supple. JVP flat. Carotids 2+ bilaterally; no bruits. No lymphadenopathy or thryomegaly appreciated. Cor: PMI normal. Regular rate & rhythm. No rubs, gallops or murmurs. Lungs: clear Abdomen: soft, nontender, nondistended. No hepatosplenomegaly. No bruits or masses. Good bowel sounds. Extremities: no cyanosis, clubbing, rash, tr edema L>R . L knee brace  Neuro: alert & oriented x 3, cranial nerves grossly intact. Moves all 4 extremities w/o difficulty. Affect pleasant.   ASSESSMENT & PLAN: 1. PAC/PVCs:  Rare palpitations now that she is on Toprol XL.  2. Hyperlipidemia: Recently started on pravastatin by PCP.  3. HTN: BP controlled on amlodipine and Toprol XL.  4. Presyncope: 3 recent episodes.  No tachypalpitations.  Symptoms do not seem orthostatic as most recent episode occurred while seated.  She has a history of atypical migraines but these episodes did not feel like her usual migraine aura.  From a cardiac perspective, it is possible that her symptoms were due to a bradyarrhythmia.  Less likely migraine aura or partial seizure.  - I will  arrange for 30 day event monitor to assess for arrhythmias.   - Carotid dopplers.   - Followup 5 weeks. If monitor is unrevealing, would consider neuro evaluation.   Laurey MoraleDalton S Irish Breisch 07/03/2013

## 2013-07-04 ENCOUNTER — Ambulatory Visit (HOSPITAL_COMMUNITY)
Admission: RE | Admit: 2013-07-04 | Discharge: 2013-07-04 | Disposition: A | Discharge status: Home / Self Care | Payer: Medicare Other | Source: Ambulatory Visit | Attending: Cardiology | Admitting: Cardiology

## 2013-07-04 DIAGNOSIS — R55 Syncope and collapse: Secondary | ICD-10-CM

## 2013-07-04 DIAGNOSIS — G459 Transient cerebral ischemic attack, unspecified: Secondary | ICD-10-CM | POA: Insufficient documentation

## 2013-07-04 NOTE — Progress Notes (Signed)
Bilateral carotid artery duplex:  1-39% ICA stenosis.  Vertebral artery flow is antegrade.     

## 2013-07-05 ENCOUNTER — Other Ambulatory Visit: Payer: Self-pay

## 2013-07-05 ENCOUNTER — Encounter: Payer: Self-pay | Admitting: *Deleted

## 2013-07-05 ENCOUNTER — Encounter (INDEPENDENT_AMBULATORY_CARE_PROVIDER_SITE_OTHER): Payer: Medicare Other

## 2013-07-05 DIAGNOSIS — I495 Sick sinus syndrome: Secondary | ICD-10-CM

## 2013-07-05 DIAGNOSIS — R55 Syncope and collapse: Secondary | ICD-10-CM

## 2013-07-05 DIAGNOSIS — R001 Bradycardia, unspecified: Secondary | ICD-10-CM

## 2013-07-05 DIAGNOSIS — R5601 Complex febrile convulsions: Secondary | ICD-10-CM

## 2013-07-05 NOTE — Progress Notes (Signed)
Patient ID: Doris RushingBetty R Zimmerman, female   DOB: 08/17/1940, 73 y.o.   MRN: [id number] [employer] 30 day cardiac event monitor applied to patient.

## 2013-07-27 ENCOUNTER — Ambulatory Visit (INDEPENDENT_AMBULATORY_CARE_PROVIDER_SITE_OTHER): Payer: 59 | Admitting: Sports Medicine

## 2013-07-27 ENCOUNTER — Encounter: Payer: Self-pay | Admitting: Sports Medicine

## 2013-07-27 VITALS — BP 135/85 | Ht 66.0 in | Wt 158.0 lb

## 2013-07-27 DIAGNOSIS — M1712 Unilateral primary osteoarthritis, left knee: Secondary | ICD-10-CM

## 2013-07-27 DIAGNOSIS — IMO0002 Reserved for concepts with insufficient information to code with codable children: Secondary | ICD-10-CM

## 2013-07-27 DIAGNOSIS — M1710 Unilateral primary osteoarthritis, unspecified knee: Secondary | ICD-10-CM

## 2013-07-27 MED ORDER — METHYLPREDNISOLONE ACETATE 40 MG/ML IJ SUSP
40.0000 mg | Freq: Once | INTRAMUSCULAR | Status: AC
Start: 2013-07-27 — End: 2013-07-27
  Administered 2013-07-27: 40 mg via INTRA_ARTICULAR

## 2013-07-27 NOTE — Progress Notes (Signed)
   Subjective:    Patient ID: [name], female    DOB: 07/07/1940, 73 y.o.   MRN: [id number]  HPI chief complaint: Left knee pain  Patient comes in today with returning left knee pain. She has a documented history of end-stage medial compartmental DJD of the same knee. She was last seen in the office in October and a cortisone injection was administered which provide her with significant symptom relief up until recently. Pain has now returned and is identical in nature to what she has experienced previously no recent trauma. Intermittent swelling.  Interim medical history reviewed Current medications reviewed Allergies reviewed    Review of Systems As above    Objective:   Physical Exam Well-developed, well-nourished. No acute distress. Awake alert and oriented x3. Vital signs are reviewed  Left knee: Range of motion 0-120. 1+ boggy synovitis. Trace effusion. Tenderness to palpation diffusely along the medial joint line. Good ligamentous stability. Fairly good alignment. Neurovascularly intact distally. Walking with a slight limp.       Assessment & Plan:  Returning left knee pain secondary to end-stage medial compartmental DJD  Patient's left knee was reinjected today using an anterior medial approach. She tolerated this without difficulty. Definitive treatment is a total knee arthroplasty but she is not yet ready to consider surgery. She has a medial unloading brace which she can continue to wear prn as well. Followup with me as needed.  Consent obtained and verified. Time-out conducted. Noted no overlying erythema, induration, or other signs of local infection. Skin prepped in a sterile fashion. Topical analgesic spray: Ethyl chloride. Joint: left knee Needle: 22g 1.5 inch Completed without difficulty. Meds: 3cc 1% xylocaine, 1cc (40mg ) depomedrol  Advised to call if fevers/chills, erythema, induration, drainage, or persistent bleeding.

## 2013-08-16 ENCOUNTER — Encounter (HOSPITAL_COMMUNITY): Payer: 59

## 2013-08-18 ENCOUNTER — Encounter (HOSPITAL_COMMUNITY): Payer: Self-pay

## 2013-08-18 ENCOUNTER — Ambulatory Visit (HOSPITAL_COMMUNITY)
Admission: RE | Admit: 2013-08-18 | Discharge: 2013-08-18 | Disposition: A | Discharge status: Home / Self Care | Payer: Medicare Other | Source: Ambulatory Visit | Attending: Internal Medicine | Admitting: Internal Medicine

## 2013-08-18 VITALS — BP 118/74 | HR 62 | Wt 163.4 lb

## 2013-08-18 DIAGNOSIS — M1990 Unspecified osteoarthritis, unspecified site: Secondary | ICD-10-CM | POA: Insufficient documentation

## 2013-08-18 DIAGNOSIS — E042 Nontoxic multinodular goiter: Secondary | ICD-10-CM | POA: Insufficient documentation

## 2013-08-18 DIAGNOSIS — B182 Chronic viral hepatitis C: Secondary | ICD-10-CM | POA: Insufficient documentation

## 2013-08-18 DIAGNOSIS — R002 Palpitations: Secondary | ICD-10-CM | POA: Insufficient documentation

## 2013-08-18 DIAGNOSIS — E785 Hyperlipidemia, unspecified: Secondary | ICD-10-CM | POA: Insufficient documentation

## 2013-08-18 DIAGNOSIS — R55 Syncope and collapse: Secondary | ICD-10-CM | POA: Insufficient documentation

## 2013-08-18 DIAGNOSIS — I10 Essential (primary) hypertension: Secondary | ICD-10-CM | POA: Insufficient documentation

## 2013-08-18 NOTE — Patient Instructions (Signed)
Your physician recommends that you schedule a follow-up appointment in: 1 year  

## 2013-08-18 NOTE — Progress Notes (Signed)
Patient ID: [name], female   DOB: 05-Jan-1941, 73 y.o.   MRN: [id number] PCP: Dr. 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 She also has multinodular goiter and chronic hepatitis C (s/p treatment - virus free at 6 months) as well as hyperlipidemia. She returns today for routine followup.   Stress echo 5/08: normal  Good exercise tolerance. Still having problems with arthritis particularly in her L knee. Walks her dogs daily. Still walking 2.5 miles in 45-50 minutes. No CP or SOB. No palpitations. Remains on Toprol.   She was started on pravastatin 40 mg daily by PCP.   At the prior appointment, she reported 3 episodes of presyncope.  The two initial episodes occurred while standing at the kitchen sink.  Both times, she became lightheaded and grabbed onto the counter to avoid falling.  Both lasted < 1 minute.  The third episode occurred on Monday. She was sitting in a chair, having just eaten a meal.  She felt lightheaded but did not lose consciousness.  She did not feel her heart racing.  No chest pain.  These episodes were not associated with warmth, diaphoresis, or nausea.    Patient has had episodes of atypical migraines in the past.  With these, she will get an aura with visual blurring, peripheral vision loss, and a scotoma.  The three episodes described above were not consistent with her usual aura symptoms.   I had her wear a 30 day event monitor.  She did not have any symptoms during that time and there were no significant arrhythmias on the monitor.  She had carotid dopplers showing mild bilateral ICA stenosis.  She has had no syncope or presyncope since last appointment.  Very rare palpitations now.    ROS: All systems negative except as listed in HPI, PMH and Problem List.  Labs (7/14): LDL 162, K 4.2, creatinine 0.68 Labs (3/15): LDL 130, HDL 48  Past Medical History  Diagnosis Date   . Hyperlipidemia   . Palpitations     PACs & PVCs  . Chronic hepatitis C   . Osteoarthritis   . Multinodular goiter     Current Outpatient Prescriptions  Medication Sig Dispense Refill  . amLODipine (NORVASC) 2.5 MG tablet Take 2.5 mg by mouth daily.      Marland Kitchen DETROL LA 4 MG 24 hr capsule Take 4 mg by mouth daily.       Marland Kitchen levothyroxine (SYNTHROID, LEVOTHROID) 75 MCG tablet Take 75 mcg by mouth daily.       . metoprolol succinate (TOPROL-XL) 50 MG 24 hr tablet Take 50 mg by mouth daily.       . pravastatin (PRAVACHOL) 10 MG tablet       . PROAIR HFA 108 (90 BASE) MCG/ACT inhaler as needed.       No current facility-administered medications for this encounter.     PHYSICAL EXAM: Filed Vitals:   08/18/13 0922  BP: 118/74  Pulse: 62   Weight change:  General:  Thin. Well appearing. No resp difficulty HEENT: normal Neck: supple. JVP flat. Carotids 2+ bilaterally; no bruits. No lymphadenopathy or thryomegaly appreciated. Cor: PMI normal. Regular rate & rhythm. No rubs, gallops or murmurs. Lungs: clear Abdomen: soft, nontender, nondistended. No hepatosplenomegaly. No bruits or masses. Good bowel sounds. Extremities: no cyanosis, clubbing, rash, tr edema L>R . L knee brace  Neuro: alert & oriented x 3, cranial  nerves grossly intact. Moves all 4 extremities w/o difficulty. Affect pleasant.   ASSESSMENT & PLAN: 1. PAC/PVCs:  Rare palpitations now that she is on Toprol XL.  2. Hyperlipidemia: Recently started on pravastatin by PCP.  3. HTN: BP controlled on amlodipine and Toprol XL.  4. Presyncope: 3 recent episodes.  No tachypalpitations.  Symptoms do not seem orthostatic.  She has a history of atypical migraines but these episodes did not feel like her usual migraine aura.  From a cardiac perspective, it is possible that her symptoms were due to a bradyarrhythmia.  Less likely migraine aura or partial seizure.  She wore a 30 day monitor that showed no significant arrhythmias.   However, she did not have symptoms while wearing the monitor.  She has not had presyncope or syncope since last appointment.  If she has another episode, would need to consider implanted loop recorder.  She will call [name] if another episode occurs.  Otherwise, we will plan on seeing her in a year.   [name] 08/18/2013

## 2014-01-06 ENCOUNTER — Other Ambulatory Visit: Payer: Self-pay

## 2014-09-01 ENCOUNTER — Ambulatory Visit (INDEPENDENT_AMBULATORY_CARE_PROVIDER_SITE_OTHER): Payer: Medicare Other | Admitting: Sports Medicine

## 2014-09-01 ENCOUNTER — Encounter: Payer: Self-pay | Admitting: Sports Medicine

## 2014-09-01 VITALS — BP 125/71 | HR 57 | Ht 66.0 in | Wt 165.0 lb

## 2014-09-01 DIAGNOSIS — IMO0002 Reserved for concepts with insufficient information to code with codable children: Secondary | ICD-10-CM

## 2014-09-01 DIAGNOSIS — M179 Osteoarthritis of knee, unspecified: Secondary | ICD-10-CM | POA: Diagnosis not present

## 2014-09-01 DIAGNOSIS — M1712 Unilateral primary osteoarthritis, left knee: Secondary | ICD-10-CM

## 2014-09-01 DIAGNOSIS — M1710 Unilateral primary osteoarthritis, unspecified knee: Secondary | ICD-10-CM

## 2014-09-01 MED ORDER — METHYLPREDNISOLONE ACETATE 40 MG/ML IJ SUSP
40.0000 mg | Freq: Once | INTRAMUSCULAR | Status: AC
Start: 2014-09-01 — End: 2014-09-01
  Administered 2014-09-01: 40 mg via INTRA_ARTICULAR

## 2014-09-01 NOTE — Progress Notes (Signed)
   Subjective:    Patient ID: [name], female    DOB: [birth year], 73 y.o.   MRN: [id number]  HPI  Patient comes in today with returning left knee pain. She has a documented history of end-stage medial compartmental DJD of the same knee. She was last seen in the office back in May 2015. Cortisone injection was administered and provided her with good symptom relief up until recently. She's getting ready to travel to [facility]. Pain has returned. It is present primarily first thing in the morning as well as at the end of the day. She gets stiffness as well. She has noticed an inability to completely flex the left knee. She is wearing a [employer] medial unloader brace and finds it helpful.  Past medical history reviewed Medications reviewed Allergies reviewed    Review of Systems     Objective:   Physical Exam Well-developed, well-nourished. No acute distress. Awake alert and oriented 3. Vital signs reviewed  Left knee: Range of motion is 0-90. 1+ boggy synovitis. No effusion. No erythema. Slight tenderness to palpation along the medial joint line. No palpable Baker's cyst. Good ligament stability. Neurovascularly intact distally. Walking with a slight limp.       Assessment & Plan:  Returning left knee pain secondary to end-stage medial compartment DJD  Patient's left knee was reinjected today using an anterior medial approach. She tolerates this without difficulty. She can continue with her medial unloader brace. She understands that definitive treatment is a total knee arthroplasty. Follow-up as needed.  Consent obtained and verified. Time-out conducted. Noted no overlying erythema, induration, or other signs of local infection. Skin prepped in a sterile fashion. Topical analgesic spray: Ethyl chloride. Joint: left knee Needle: 22g 1.5 inch Completed without difficulty. Meds: 3cc 1% xylocaine, 1cc (40mg ) depomedrol  Advised to call if fevers/chills, erythema,  induration, drainage, or persistent bleeding.

## 2014-09-18 ENCOUNTER — Other Ambulatory Visit: Payer: Self-pay

## 2015-05-09 ENCOUNTER — Ambulatory Visit (INDEPENDENT_AMBULATORY_CARE_PROVIDER_SITE_OTHER): Payer: Medicare Other | Admitting: Sports Medicine

## 2015-05-09 ENCOUNTER — Encounter: Payer: Self-pay | Admitting: Sports Medicine

## 2015-05-09 VITALS — BP 124/66 | Ht 66.0 in | Wt 165.0 lb

## 2015-05-09 DIAGNOSIS — M25562 Pain in left knee: Secondary | ICD-10-CM | POA: Diagnosis not present

## 2015-05-09 DIAGNOSIS — M1712 Unilateral primary osteoarthritis, left knee: Secondary | ICD-10-CM

## 2015-05-09 MED ORDER — METHYLPREDNISOLONE ACETATE 40 MG/ML IJ SUSP
40.0000 mg | Freq: Once | INTRAMUSCULAR | Status: AC
  Administered 2015-05-09: 40 mg via INTRA_ARTICULAR

## 2015-05-09 NOTE — Progress Notes (Signed)
   Subjective:    Patient ID: [name], female    DOB: 04/13/1940, 75 y.o.   MRN: [id number]  HPI   Patient comes in today with returning left knee pain. She has a well-documented history of end-stage medial compartmental DJD in this knee. Last set of x-rays were in 2014. She gets periodic cortisone injections which do help. Last injection was in June of last year. Pain has returned. It is identical in nature to what she has experienced previously. She denies any trauma. Pain is primarily along the medial knee. She has not noticed any significant swelling. Pain is worse with walking and improves at rest. No mechanical symptoms. She is wearing a medial unloader brace which does help.  Interim medical history reviewed Medications reviewed Allergies reviewed    Review of Systems    as above Objective:   Physical Exam  Well-developed, well-nourished. No acute distress. Awake alert and oriented 3. Vital signs reviewed  Left knee: Patient has range of motion from 0-130. 1+ boggy synovitis. There is palpable hypertrophy along the medial joint line consistent with DJD here. She is tender to palpation along the medial joint line but a negative McMurray's. Good joint stability. Neurovascularly intact distally. Walking with a slight limp.      Assessment & Plan:  Returning left knee pain secondary to end-stage medial compartmental DJD  Patient's left knee is reinjected with cortisone today. An anterior medial approach is utilized. She tolerates this without difficulty. Patient is now ready to discuss definitive treatment with an orthopedist. Therefore, I will refer her to Dr. Lequita Halt to discussed the risks and benefits of total knee arthroplasty. She will continue with her medial unloader brace with activity and will follow-up with me as necessary.  Consent obtained and verified. Time-out conducted. Noted no overlying erythema, induration, or other signs of local infection. Skin prepped  in a sterile fashion. Topical analgesic spray: Ethyl chloride. Joint: left knee Needle: 22g 1.5 inch Completed without difficulty. Meds: 3cc 1% xylocaine, 1cc ( ) depomedrol  Advised to call if fevers/chills, erythema, induration, drainage, or persistent bleeding.

## 2015-05-09 NOTE — Patient Instructions (Signed)
[facility] Orthoped[facility] Dr. Despina Hick They will call you with an appointment [address] [phone]

## 2015-05-14 ENCOUNTER — Ambulatory Visit: Payer: Medicare Other | Admitting: Sports Medicine

## 2015-09-04 ENCOUNTER — Encounter: Payer: Self-pay | Admitting: Sports Medicine

## 2015-09-04 ENCOUNTER — Ambulatory Visit (INDEPENDENT_AMBULATORY_CARE_PROVIDER_SITE_OTHER): Payer: Medicare Other | Admitting: Sports Medicine

## 2015-09-04 VITALS — BP 121/78 | HR 67 | Ht 66.0 in | Wt 165.0 lb

## 2015-09-04 DIAGNOSIS — M1712 Unilateral primary osteoarthritis, left knee: Secondary | ICD-10-CM | POA: Diagnosis not present

## 2015-09-04 MED ORDER — METHYLPREDNISOLONE ACETATE 40 MG/ML IJ SUSP
40.0000 mg | Freq: Once | INTRAMUSCULAR | Status: AC
  Administered 2015-09-04: 40 mg via INTRA_ARTICULAR

## 2015-09-04 NOTE — Progress Notes (Signed)
   Subjective:    Patient ID: [name], female    DOB: 02/23/1941, 75 y.o.   MRN: [id number]  HPI chief complaint: Left knee pain  Patient comes in today with returning left knee pain. She has a well-documented history of bone-on-bone end-stage medial compartmental DJD in this knee. She has had multiple cortisone injections in the past. Last injection was 4 months ago. Each injection provided her with about 2 months of symptom relief. She saw Dr.Aluisio and he explained to her that total knee arthroplasty was the definitive treatment. She is hesitant about proceeding with surgery. She has several questions regarding Visco supplementation. She denies any recent trauma. She takes 800 mg of ibuprofen daily and this does seem to help some.    Review of Systems     Objective:   Physical Exam  Well-developed, well-nourished. No acute distress  Left knee: Range of motion is 0-100. 1+ boggy synovitis. Bony hypertrophy consistent with end-stage DJD. Mild varus deformity. She is mildly tender to palpation along the medial joint line. Some tenderness along the lateral joint line as well. 2+ patellofemoral crepitus. Neurovascularly intact distally. Walking with a limp.       Assessment & Plan:   Left knee pain secondary to end-stage medial compartmental DJD  I once again explained to the patient that definitive treatment is a total knee arthroplasty. I certainly understand her hesitation with proceeding with surgery but her nonsurgical options are quite limited. We discussed Visco supplementation and I explained to her that the chances of it being helpful are rather limited given the fact that she is bone-on-bone. I did agree to reinject her knee with cortisone today. An anterior medial approach was utilized and she tolerated this without difficulty. She will follow-up with Dr. Lequita HaltAluisio she is ready to proceed with surgery. Follow-up with me as needed.  Consent obtained and verified. Time-out  conducted. Noted no overlying erythema, induration, or other signs of local infection. Skin prepped in a sterile fashion. Topical analgesic spray: Ethyl chloride. Joint: left knee Needle: 22g 1.5 inch Completed without difficulty. Meds: 3cc 1% xylocaine, 1cc (40mg ) depomedrol  Advised to call if fevers/chills, erythema, induration, drainage, or persistent bleeding.

## 2016-01-07 ENCOUNTER — Encounter: Payer: Self-pay | Admitting: Sports Medicine

## 2016-01-07 ENCOUNTER — Ambulatory Visit (INDEPENDENT_AMBULATORY_CARE_PROVIDER_SITE_OTHER): Payer: Medicare Other | Admitting: Sports Medicine

## 2016-01-07 VITALS — BP 112/70 | Ht 66.0 in | Wt 165.0 lb

## 2016-01-07 DIAGNOSIS — M25551 Pain in right hip: Secondary | ICD-10-CM | POA: Diagnosis not present

## 2016-01-07 DIAGNOSIS — M25561 Pain in right knee: Secondary | ICD-10-CM | POA: Diagnosis not present

## 2016-01-07 MED ORDER — METHYLPREDNISOLONE ACETATE 80 MG/ML IJ SUSP
80.0000 mg | Freq: Once | INTRAMUSCULAR | Status: AC
  Administered 2016-01-07: 80 mg via INTRAMUSCULAR

## 2016-01-07 NOTE — Progress Notes (Signed)
Subjective:  Patient ID: [name], female    DOB: 04-Aug-1940  Age: 75 y.o. MRN: [id number]  CC: Right Knee Pain  HPI [name] is a 75 year old female with PMH significant for prior Baker's cyst of the right knee and candidate for left knee replacement, presents today for right knee pain. Patient reports about 2-3 weeks ago while bending down gardening, she started to feel pain in her right knee. She described the pain as "pressure on a band" and reported stiffness as she could not flex her knee. Patient feels that pain was diffusely throughout her knee. The knee has gotten better in the past weeks, only giving her pain with flexion. Patient reports taking 800 mg ibuprofen every day and Tylenol at night if pain prevented her from sleeping. Patient reports that both medications are effective at alleviating the pain. Patient denies any numbness, swelling, or tingling. Patient has a history of Baker's cyst 8 years ago where it ruptured. However patient reports that this feels different from the Baker's cyst.   Patient also reports right hip pain. Patient was in PT  About 2 to 3 weeks ago to prepare for her left knee replacement. In PT, she used an abductor machine. When she abducted her left leg, she felt a lot of pain in her right posterior hip. When she abducted her right leg, she felt a lot of pain on her left knee. Patient reports that pain in right hip is a a burning pain located around her posterior portion of her hip. Patient reports that it doesn't bother her much now, but she can feel it when laying down and doing certain stretches. Patient reports taking 800 mg ibuprofen every day and Tylenol at night if pain prevented her from sleeping. Patient reports that both medications are effective at alleviating the pain. Patient denies any numbness, swelling, or tingling.  ROS Review of Systems  Musculoskeletal:       Right Knee Pain      Right Hip Pain  Refer to HPI for pertinent  positives and negatives  Objective:  BP 112/70   Ht 5\' 6"  (1.676 m)   Wt 165 lb (74.8 kg)   BMI 26.63 kg/m   Physical Exam  Musculoskeletal:  Right Knee: No lesions, deformities, or swelling. No tenderness. Crepitus with flexion of the knee. Pain on lateral side of the knee with flexion greater than 100 degrees, otherwise normal ROM. Normal strength. ACL, PCL, MCL, and LCL intact. Neurovascularly intact.  Left Knee: Varus knee deformity. No tenderness or swelling. Normal ROM. Normal strength. ACL, PCL, MCL, and LCL intact. Neurovascularly intact.  Right Hip: No lesions, deformities, or swelling. Tenderness along the ischial tuberosity. Weakness and pain with abduction and 90 degree flexion with extension of hip. Negative FABER. Negative straight leg raise. Neurovascularly intact.  Left Hip: No lesions, deformities, or swelling. No tenderness. Normal ROM. Normal strength. Negative FABER. Negative straight leg raise. Neurovascularly intact.     Assessment & Plan:   1. Right Knee Pain Secondary to DJD (Degenerative Joint Disease) vs Small Baker's Cyst Discussed with patient that the pain in her right knee is different than her left knee and does not warrant a total knee replacement at this time. Offered patient glucocorticoid injection. Patient elected not to get knee injection. Patient said that the pain is getting better on it's own and does not feel she needs to do anything for it at this time.   2. Right Hip Pain  Secondary to Hamstring Strain Based on physical exam and history, we believe that the patient strained her hamstrings while using the abductor machine at PT. Advised patient to not use that machine for PT. Patient reported that she has stopped PT and does not have access to machine, so she will not continue. Offered patient a IM glucocorticoid injection and patient elected to do so.   Meds ordered this encounter  Medications  . methylPREDNISolone acetate (DEPO-MEDROL) injection  80 mg    Follow-up: No Follow-up on file.   [name], Medical Student   Patient seen and evaluated with the medical student. I agree with the above plan of care. Patient likely has some mild arthritis in the right knee. We did discuss a cortisone injection but her symptoms seem to be improving. We can reconsider this at a later date if needed. We did decide to do a simple IM Depo-Medrol injection for her hamstring strain. She'll avoid those exercises that cause pain. She will let me know if symptoms persist or worsen here.

## 2016-03-26 ENCOUNTER — Encounter (HOSPITAL_COMMUNITY): Payer: Self-pay | Admitting: *Deleted

## 2016-04-15 ENCOUNTER — Ambulatory Visit: Payer: Self-pay | Admitting: Orthopedic Surgery

## 2016-04-22 ENCOUNTER — Other Ambulatory Visit (HOSPITAL_COMMUNITY): Payer: Self-pay | Admitting: Emergency Medicine

## 2016-04-22 NOTE — Patient Instructions (Addendum)
[name]  04/22/2016   Your procedure is scheduled on: 04-28-16  [facility]  [facility] 3rd floor to  Short Stay Center at 515 AM.  Call this number if you have problems the morning of surgery [phone]   Remember: ONLY 1 PERSON MAY GO WITH YOU TO SHORT STAY TO GET  READY MORNING OF YOUR SURGERY.  Do not eat food or drink liquids :After Midnight.     Take these medicines the morning of surgery with A SIP OF WATER: metoprolol(Toprol XL), levothyroxine(Synthroid),  amlodipine(Norvasc),  inhaler as needed                                You may not have any metal on your body including hair pins and              piercings  Do not wear jewelry, make-up, lotions, powders or perfumes, deodorant             Do not wear nail polish.  Do not shave  48 hours prior to surgery.               Do not bring valuables to the hospital. [facility] IS NOT             RESPONSIBLE   FOR VALUABLES.  Contacts, dentures or bridgework may not be worn into surgery.  Leave suitcase in the car. After surgery it may be brought to your room.              Please read over the following fact sheets you were given: _____________________________________________________________________             [facility] - Preparing for Surgery Before surgery, you can play an important role.  Because skin is not sterile, your skin needs to be as free of germs as possible.  You can reduce the number of germs on your skin by washing with CHG (chlorahexidine gluconate) soap before surgery.  CHG is an antiseptic cleaner which kills germs and bonds with the skin to continue killing germs even after washing. Please DO NOT use if you have an allergy to CHG or antibacterial soaps.  If your skin becomes reddened/irritated stop using the CHG and inform your nurse when you arrive at Short Stay. Do not shave (including legs and underarms) for at least 48 hours prior to the  first CHG shower.  You may shave your face/neck. Please follow these instructions carefully:  1.  Shower with CHG Soap the night before surgery and the  morning of Surgery.  2.  If you choose to wash your hair, wash your hair first as usual with your  normal  shampoo.  3.  After you shampoo, rinse your hair and body thoroughly to remove the  shampoo.                           4.  Use CHG as you would any other liquid soap.  You can apply chg directly  to the skin and wash                       Gently with a scrungie or clean washcloth.  5.  Apply the CHG Soap to your body  ONLY FROM THE NECK DOWN.   Do not use on face/ open                           Wound or open sores. Avoid contact with eyes, ears mouth and genitals (private parts).                       Wash face,  Genitals (private parts) with your normal soap.             6.  Wash thoroughly, paying special attention to the area where your surgery  will be performed.  7.  Thoroughly rinse your body with warm water from the neck down.  8.  DO NOT shower/wash with your normal soap after using and rinsing off  the CHG Soap.                9.  Pat yourself dry with a clean towel.            10.  Wear clean pajamas.            11.  Place clean sheets on your bed the night of your first shower and do not  sleep with pets. Day of Surgery : Do not apply any lotions/deodorants the morning of surgery.  Please wear clean clothes to the hospital/surgery center.  FAILURE TO FOLLOW THESE INSTRUCTIONS MAY RESULT IN THE CANCELLATION OF YOUR SURGERY PATIENT SIGNATURE_________________________________  NURSE SIGNATURE__________________________________  ________________________________________________________________________   [name]  An incentive spirometer is a tool that can help keep your lungs clear and active. This tool measures how well you are filling your lungs with each breath. Taking long deep breaths may help reverse or decrease  the chance of developing breathing (pulmonary) problems (especially infection) following:  A long period of time when you are unable to move or be active. BEFORE THE PROCEDURE   If the spirometer includes an indicator to show your best effort, your nurse or respiratory therapist will set it to a desired goal.  If possible, sit up straight or lean slightly forward. Try not to slouch.  Hold the incentive spirometer in an upright position. INSTRUCTIONS FOR USE  1. Sit on the edge of your bed if possible, or sit up as far as you can in bed or on a chair. 2. Hold the incentive spirometer in an upright position. 3. Breathe out normally. 4. Place the mouthpiece in your mouth and seal your lips tightly around it. 5. Breathe in slowly and as deeply as possible, raising the piston or the ball toward the top of the column. 6. Hold your breath for 3-5 seconds or for as long as possible. Allow the piston or ball to fall to the bottom of the column. 7. Remove the mouthpiece from your mouth and breathe out normally. 8. Rest for a few seconds and repeat Steps 1 through 7 at least 10 times every 1-2 hours when you are awake. Take your time and take a few normal breaths between deep breaths. 9. The spirometer may include an indicator to show your best effort. Use the indicator as a goal to work toward during each repetition. 10. After each set of 10 deep breaths, practice coughing to be sure your lungs are clear. If you have an incision (the cut made at the time of surgery), support your incision when coughing by placing a pillow or rolled up towels firmly against it. Once  you are able to get out of bed, walk around indoors and cough well. You may stop using the incentive spirometer when instructed by your caregiver.  RISKS AND COMPLICATIONS  Take your time so you do not get dizzy or light-headed.  If you are in pain, you may need to take or ask for pain medication before doing incentive spirometry. It is  harder to take a deep breath if you are having pain. AFTER USE  Rest and breathe slowly and easily.  It can be helpful to keep track of a log of your progress. Your caregiver can provide you with a simple table to help with this. If you are using the spirometer at home, follow these instructions: SEEK MEDICAL CARE IF:   You are having difficultly using the spirometer.  You have trouble using the spirometer as often as instructed.  Your pain medication is not giving enough relief while using the spirometer.  You develop fever of 100.5 F (38.1 C) or higher. SEEK IMMEDIATE MEDICAL CARE IF:   You cough up bloody sputum that had not been present before.  You develop fever of 102 F (38.9 C) or greater.  You develop worsening pain at or near the incision site. MAKE SURE YOU:   Understand these instructions.  Will watch your condition.  Will get help right away if you are not doing well or get worse. Document Released: 07/21/2006 Document Revised: 06/02/2011 Document Reviewed: 09/21/2006 ExitCare Patient Information 2014 ExitCare, [place].   ________________________________________________________________________  WHAT IS A BLOOD TRANSFUSION? Blood Transfusion Information  A transfusion is the replacement of blood or some of its parts. Blood is made up of multiple cells which provide different functions.  Red blood cells carry oxygen and are used for blood loss replacement.  White blood cells fight against infection.  Platelets control bleeding.  Plasma helps clot blood.  Other blood products are available for specialized needs, such as hemophilia or other clotting disorders. BEFORE THE TRANSFUSION  Who gives blood for transfusions?   Healthy volunteers who are fully evaluated to make sure their blood is safe. This is blood bank blood. Transfusion therapy is the safest it has ever been in the practice of medicine. Before blood is taken from a donor, a complete history  is taken to make sure that person has no history of diseases nor engages in risky social behavior (examples are intravenous drug use or sexual activity with multiple partners). The donor's travel history is screened to minimize risk of transmitting infections, such as malaria. The donated blood is tested for signs of infectious diseases, such as HIV and hepatitis. The blood is then tested to be sure it is compatible with you in order to minimize the chance of a transfusion reaction. If you or a relative donates blood, this is often done in anticipation of surgery and is not appropriate for emergency situations. It takes many days to process the donated blood. RISKS AND COMPLICATIONS Although transfusion therapy is very safe and saves many lives, the main dangers of transfusion include:   Getting an infectious disease.  Developing a transfusion reaction. This is an allergic reaction to something in the blood you were given. Every precaution is taken to prevent this. The decision to have a blood transfusion has been considered carefully by your caregiver before blood is given. Blood is not given unless the benefits outweigh the risks. AFTER THE TRANSFUSION  Right after receiving a blood transfusion, you will usually feel much better and more energetic. This  is especially true if your red blood cells have gotten low (anemic). The transfusion raises the level of the red blood cells which carry oxygen, and this usually causes an energy increase.  The nurse administering the transfusion will monitor you carefully for complications. HOME CARE INSTRUCTIONS  No special instructions are needed after a transfusion. You may find your energy is better. Speak with your caregiver about any limitations on activity for underlying diseases you may have. SEEK MEDICAL CARE IF:   Your condition is not improving after your transfusion.  You develop redness or irritation at the intravenous (IV) site. SEEK IMMEDIATE  MEDICAL CARE IF:  Any of the following symptoms occur over the next 12 hours:  Shaking chills.  You have a temperature by mouth above 102 F (38.9 C), not controlled by medicine.  Chest, back, or muscle pain.  People around you feel you are not acting correctly or are confused.  Shortness of breath or difficulty breathing.  Dizziness and fainting.  You get a rash or develop hives.  You have a decrease in urine output.  Your urine turns a dark color or changes to pink, red, or brown. Any of the following symptoms occur over the next 10 days:  You have a temperature by mouth above 102 F (38.9 C), not controlled by medicine.  Shortness of breath.  Weakness after normal activity.  The white part of the eye turns yellow (jaundice).  You have a decrease in the amount of urine or are urinating less often.  Your urine turns a dark color or changes to pink, red, or brown. Document Released: 03/07/2000 Document Revised: 06/02/2011 Document Reviewed: 10/25/2007 [facility] Patient Information 2014 [place], [place].  _______________________________________________________________________

## 2016-04-22 NOTE — Progress Notes (Signed)
LOV 02-13-16 DR WEBB ON CHART   MEDICAL CLEARANCE DR Hyman HopesWEBB 02-13-16 ON CHART

## 2016-04-24 ENCOUNTER — Encounter (HOSPITAL_COMMUNITY): Payer: Self-pay

## 2016-04-24 ENCOUNTER — Encounter (HOSPITAL_COMMUNITY)
Admission: RE | Admit: 2016-04-24 | Discharge: 2016-04-24 | Disposition: A | Discharge status: Home / Self Care | Payer: Medicare Other | Source: Ambulatory Visit | Attending: Orthopedic Surgery | Admitting: Orthopedic Surgery

## 2016-04-24 DIAGNOSIS — I10 Essential (primary) hypertension: Secondary | ICD-10-CM | POA: Insufficient documentation

## 2016-04-24 DIAGNOSIS — R9431 Abnormal electrocardiogram [ECG] [EKG]: Secondary | ICD-10-CM | POA: Insufficient documentation

## 2016-04-24 DIAGNOSIS — R001 Bradycardia, unspecified: Secondary | ICD-10-CM | POA: Diagnosis not present

## 2016-04-24 DIAGNOSIS — Z0183 Encounter for blood typing: Secondary | ICD-10-CM | POA: Insufficient documentation

## 2016-04-24 DIAGNOSIS — Z01818 Encounter for other preprocedural examination: Secondary | ICD-10-CM | POA: Insufficient documentation

## 2016-04-24 DIAGNOSIS — Z01812 Encounter for preprocedural laboratory examination: Secondary | ICD-10-CM | POA: Diagnosis not present

## 2016-04-24 DIAGNOSIS — M1712 Unilateral primary osteoarthritis, left knee: Secondary | ICD-10-CM | POA: Insufficient documentation

## 2016-04-24 HISTORY — DX: Pneumonia, unspecified organism: J18.9

## 2016-04-24 HISTORY — DX: Personal history of other medical treatment: Z92.89

## 2016-04-24 HISTORY — DX: Headache: R51

## 2016-04-24 HISTORY — DX: Essential (primary) hypertension: I10

## 2016-04-24 HISTORY — DX: Headache, unspecified: R51.9

## 2016-04-24 LAB — CBC
HEMATOCRIT: 36.6 % (ref 36.0–46.0)
Hemoglobin: 12.2 g/dL (ref 12.0–15.0)
MCH: 29.8 pg (ref 26.0–34.0)
MCHC: 33.3 g/dL (ref 30.0–36.0)
MCV: 89.3 fL (ref 78.0–100.0)
PLATELETS: 155 10*3/uL (ref 150–400)
RBC: 4.1 MIL/uL (ref 3.87–5.11)
RDW: 13.5 % (ref 11.5–15.5)
WBC: 5.3 10*3/uL (ref 4.0–10.5)

## 2016-04-24 LAB — COMPREHENSIVE METABOLIC PANEL
ALT: 15 U/L (ref 14–54)
AST: 17 U/L (ref 15–41)
Albumin: 4.1 g/dL (ref 3.5–5.0)
Alkaline Phosphatase: 71 U/L (ref 38–126)
Anion gap: 8 (ref 5–15)
BILIRUBIN TOTAL: 1.2 mg/dL (ref 0.3–1.2)
BUN: 19 mg/dL (ref 6–20)
CO2: 27 mmol/L (ref 22–32)
CREATININE: 0.58 mg/dL (ref 0.44–1.00)
Calcium: 9.3 mg/dL (ref 8.9–10.3)
Chloride: 106 mmol/L (ref 101–111)
Glucose, Bld: 101 mg/dL — ABNORMAL HIGH (ref 65–99)
POTASSIUM: 3.6 mmol/L (ref 3.5–5.1)
Sodium: 141 mmol/L (ref 135–145)
TOTAL PROTEIN: 6.6 g/dL (ref 6.5–8.1)

## 2016-04-24 LAB — SURGICAL PCR SCREEN
MRSA, PCR: NEGATIVE
STAPHYLOCOCCUS AUREUS: NEGATIVE

## 2016-04-24 LAB — ABO/RH: ABO/RH(D): O POS

## 2016-04-24 LAB — PROTIME-INR
INR: 0.98
PROTHROMBIN TIME: 13 s (ref 11.4–15.2)

## 2016-04-24 LAB — APTT: aPTT: 35 seconds (ref 24–36)

## 2016-04-27 ENCOUNTER — Ambulatory Visit: Payer: Self-pay | Admitting: Orthopedic Surgery

## 2016-04-27 NOTE — H&P (Signed)
[name] DOB: 04/12/1940 Married / Language: English / Race: White Female Date of Admission:  04/28/2016 CC:  Left knee pain History of Present Illness  The patient is a 76 year old female who comes infor a preoperative History and Physical. The patient is scheduled for a left total knee arthroplasty to be performed by Dr. Gus RankinFrank V. Aluisio, MD at [facility] on 04-28-2016. The patient is a 76 year old female who presented for a Follow-up for Follow-up Knee. The patient is being followed for their left knee pain and osteoarthritis. Symptoms reported include: instability (on/off), while the patient does not report symptoms of: pain (has improved since injection 09-04-15 by Dr Margaretha Sheffieldraper), pain with weightbearing or difficulty ambulating. Current treatment includes: home exercise program (walks) and NSAIDs. The following medication has been used for pain control: antiinflammatory medication (Ibuprofen). The patient has reported improvement of their symptoms with: Cortisone injections. When I saw [name] last she did have a bad flare of pain in her knee and she got an injection with Dr. Margaretha Sheffieldraper. She was ssen recently to discuss future treatment options for her knee. She realizes that the cortisone is not going to last forever. She is now at a point where she would like to get a more permanent fix for the knee. Total knee surgery had been discussed with the patient and she would like to proceed witht the surgery. They have been treated conservatively in the past for the above stated problem and despite conservative measures, they continue to have progressive pain and severe functional limitations and dysfunction. They have failed non-operative management including home exercise, medications, and injections. It is felt that they would benefit from undergoing total joint replacement. Risks and benefits of the procedure have been discussed with the patient and they elect to proceed with surgery. There are  no active contraindications to surgery such as ongoing infection or rapidly progressive neurological disease.   Problem List/Past Medical Localized osteoarthritis of left knee (M17.12)  Cardiac Arrhythmia  Hepatitis C  High blood pressure  Hypercholesterolemia  Hypothyroidism  Multinodular Goiter  Migraine Headache  Osteoarthritis  Arthritis of left knee (M17.12)  Measles  Mumps  Menopause  History of GI Bleed  History of Transfusion (Post GI Bleed) - 1990  Duodenal Ulcer  History of PACs, PVCs Arrhythmia   Allergies Penicillin G Pot in Dextrose *PENICILLINS*  Demerol *ANALGESICS - OPIOID*  Macrobid *URINARY ANTI-INFECTIVES*  Griseofulvin Microsize *ANTIFUNGALS*   Family History Cerebrovascular Accident  Brother, Maternal Grandmother. Drug / Alcohol Addiction  Brother, Father. Heart Disease  Father, Mother. Hypertension  Brother, Father, Maternal Grandmother, Mother. Osteoarthritis  Brother, Father, Mother. Osteoporosis  Mother. Siblings  Brother - deceased due to complications following total joint procedure, history on intracranial bleed.  Social History Children  3 Current drinker  07/12/2015: Currently drinks wine 5-7 times per week Current work status  retired [employer] Exercise  Exercises daily; does running / walking Living situation  live with spouse Marital status  married No history of drug/alcohol rehab  Not under pain contract  Number of flights of stairs before winded  2-3 Tobacco / smoke exposure  07/12/2015: no Tobacco use  Never smoker. 07/12/2015  Medication History  Pravastatin Sodium (20MG  Tablet, Oral) Active. Levothyroxine Sodium (25MCG Tablet, Oral) Active. Metoprolol Succinate ER (50MG  Tablet ER 24HR, Oral) Active. AmLODIPine Besylate (2.5MG  Tablet, Oral) Active. Ibuprofen (Oral) Specific strength unknown - Active. (PRN) Vitamin D Active. Rescue Albuterol Inhaler Active.  Past Surgical History   Carpal Tunnel  Repair  right Tubal Ligation     Review of Systems General Not Present- Chills, Fatigue, Fever, Memory Loss, Night Sweats, Weight Gain and Weight Loss. Skin Not Present- Eczema, Hives, Itching, Lesions and Rash. HEENT Not Present- Dentures, Double Vision, Headache, Hearing Loss, Tinnitus and Visual Loss. Respiratory Not Present- Allergies, Chronic Cough, Coughing up blood, Shortness of breath at rest and Shortness of breath with exertion. Cardiovascular Not Present- Chest Pain, Difficulty Breathing Lying Down, Murmur, Palpitations, Racing/skipping heartbeats and Swelling. Gastrointestinal Not Present- Abdominal Pain, Bloody Stool, Constipation, Diarrhea, Difficulty Swallowing, Heartburn, Jaundice, Loss of appetitie, Nausea and Vomiting. Female Genitourinary Not Present- Blood in Urine, Discharge, Flank Pain, Incontinence, Painful Urination, Urgency, Urinary frequency, Urinary Retention, Urinating at Night and Weak urinary stream. Musculoskeletal Present- Joint Pain and Joint Swelling. Not Present- Back Pain, Morning Stiffness, Muscle Pain, Muscle Weakness and Spasms. Neurological Not Present- Blackout spells, Difficulty with balance, Dizziness, Paralysis, Tremor and Weakness. Psychiatric Not Present- Insomnia.  Vitals Weight: 168 lb Height: 66in Body Surface Area: 1.86 m Body Mass Index: 27.12 kg/m  Pulse: 60 (Regular)  BP: 118/74 (Sitting, Right Arm, Standard)    Physical Exam  General Mental Status -Alert, cooperative and good historian. General Appearance-pleasant, Not in acute distress. Orientation-Oriented X3. Build & Nutrition-Well nourished and Well developed.  Head and Neck Head-normocephalic, atraumatic . Neck Global Assessment - supple, no bruit auscultated on the right, no bruit auscultated on the left.  Eye Vision-Wears corrective lenses. Pupil - Bilateral-Regular and Round. Motion - Bilateral-EOMI.  Chest and Lung  Exam Auscultation Breath sounds - clear at anterior chest wall and clear at posterior chest wall. Adventitious sounds - No Adventitious sounds.  Cardiovascular Auscultation Rhythm - Regular rate and rhythm. Heart Sounds - S1 WNL and S2 WNL. Murmurs & Other Heart Sounds - Auscultation of the heart reveals - No Murmurs.  Abdomen Palpation/Percussion Tenderness - Abdomen is non-tender to palpation. Rigidity (guarding) - Abdomen is soft. Auscultation Auscultation of the abdomen reveals - Bowel sounds normal.  Female Genitourinary Note: Not done, not pertinent to present illness   Musculoskeletal Note: She is alert and oriented, in no apparent distress. Her left knee shows no effusion. There is moderate crepitus on range of motion in the knee with tenderness medial greater than lateral. No instability. Range about 0 to 125.     Assessment & Plan  Localized osteoarthritis of left knee (M17.12)  Note:Surgical Plans: Left Total Knee Replacement  Disposition: Home  PCP: Shirlean Mylar  IV TXA  Anesthesia Issues: None  Patient was instructed on what medications to stop prior to surgery.  Signed electronically by Beckey Rutter, III PA-

## 2016-04-28 ENCOUNTER — Encounter (HOSPITAL_COMMUNITY): Payer: Self-pay | Admitting: *Deleted

## 2016-04-28 ENCOUNTER — Encounter (HOSPITAL_COMMUNITY)
Admission: RE | Disposition: A | Discharge status: Home Health | Payer: Self-pay | Source: Ambulatory Visit | Attending: Orthopedic Surgery

## 2016-04-28 ENCOUNTER — Inpatient Hospital Stay (HOSPITAL_COMMUNITY)
Admission: RE | Admit: 2016-04-28 | Discharge: 2016-04-30 | DRG: 470 | Disposition: A | Discharge status: Home Health | Payer: Medicare Other | Source: Ambulatory Visit | Attending: Orthopedic Surgery | Admitting: Orthopedic Surgery

## 2016-04-28 ENCOUNTER — Inpatient Hospital Stay (HOSPITAL_COMMUNITY): Payer: Medicare Other | Admitting: Anesthesiology

## 2016-04-28 DIAGNOSIS — M1712 Unilateral primary osteoarthritis, left knee: Secondary | ICD-10-CM | POA: Diagnosis present

## 2016-04-28 DIAGNOSIS — B182 Chronic viral hepatitis C: Secondary | ICD-10-CM | POA: Diagnosis present

## 2016-04-28 DIAGNOSIS — Z883 Allergy status to other anti-infective agents status: Secondary | ICD-10-CM | POA: Diagnosis not present

## 2016-04-28 DIAGNOSIS — E039 Hypothyroidism, unspecified: Secondary | ICD-10-CM | POA: Diagnosis present

## 2016-04-28 DIAGNOSIS — Z885 Allergy status to narcotic agent status: Secondary | ICD-10-CM

## 2016-04-28 DIAGNOSIS — M179 Osteoarthritis of knee, unspecified: Secondary | ICD-10-CM

## 2016-04-28 DIAGNOSIS — M1710 Unilateral primary osteoarthritis, unspecified knee: Secondary | ICD-10-CM

## 2016-04-28 DIAGNOSIS — E785 Hyperlipidemia, unspecified: Secondary | ICD-10-CM | POA: Diagnosis present

## 2016-04-28 DIAGNOSIS — Z880 Allergy status to penicillin: Secondary | ICD-10-CM | POA: Diagnosis not present

## 2016-04-28 DIAGNOSIS — I10 Essential (primary) hypertension: Secondary | ICD-10-CM | POA: Diagnosis present

## 2016-04-28 HISTORY — PX: TOTAL KNEE ARTHROPLASTY: SHX125

## 2016-04-28 LAB — TYPE AND SCREEN
ABO/RH(D): O POS
Antibody Screen: NEGATIVE

## 2016-04-28 SURGERY — ARTHROPLASTY, KNEE, TOTAL
Anesthesia: Monitor Anesthesia Care | Site: Knee | Laterality: Left

## 2016-04-28 MED ORDER — PROPOFOL 500 MG/50ML IV EMUL
INTRAVENOUS | Status: DC | PRN
  Administered 2016-04-28: 60 ug/kg/min via INTRAVENOUS

## 2016-04-28 MED ORDER — ONDANSETRON HCL 4 MG/2ML IJ SOLN: 4.0000 mg | Freq: Once | INTRAMUSCULAR | Status: DC | PRN

## 2016-04-28 MED ORDER — EPHEDRINE 5 MG/ML INJ
INTRAVENOUS | Status: AC
  Filled 2016-04-28: qty 10

## 2016-04-28 MED ORDER — METOCLOPRAMIDE HCL 5 MG PO TABS: 5.0000 mg | ORAL_TABLET | Freq: Three times a day (TID) | ORAL | Status: DC | PRN

## 2016-04-28 MED ORDER — CHLORHEXIDINE GLUCONATE 4 % EX LIQD: 60.0000 mL | Freq: Once | CUTANEOUS | Status: DC

## 2016-04-28 MED ORDER — BUPIVACAINE HCL (PF) 0.25 % IJ SOLN
INTRAMUSCULAR | Status: AC
  Filled 2016-04-28: qty 30

## 2016-04-28 MED ORDER — 0.9 % SODIUM CHLORIDE (POUR BTL) OPTIME
TOPICAL | Status: DC | PRN
  Administered 2016-04-28: 1000 mL

## 2016-04-28 MED ORDER — PRAVASTATIN SODIUM 20 MG PO TABS
20.0000 mg | ORAL_TABLET | Freq: Every day | ORAL | Status: DC
  Administered 2016-04-28 – 2016-04-29 (×2): 20 mg via ORAL
  Filled 2016-04-28 (×2): qty 1

## 2016-04-28 MED ORDER — ACETAMINOPHEN 650 MG RE SUPP: 650.0000 mg | Freq: Four times a day (QID) | RECTAL | Status: DC | PRN

## 2016-04-28 MED ORDER — PHENOL 1.4 % MT LIQD: 1.0000 | OROMUCOSAL | Status: DC | PRN

## 2016-04-28 MED ORDER — LIDOCAINE 2% (20 MG/ML) 5 ML SYRINGE
INTRAMUSCULAR | Status: AC
  Filled 2016-04-28: qty 5

## 2016-04-28 MED ORDER — METHOCARBAMOL 500 MG PO TABS
500.0000 mg | ORAL_TABLET | Freq: Four times a day (QID) | ORAL | Status: DC | PRN
  Administered 2016-04-30 (×2): 500 mg via ORAL
  Filled 2016-04-28 (×2): qty 1

## 2016-04-28 MED ORDER — TRANEXAMIC ACID 1000 MG/10ML IV SOLN
1000.0000 mg | Freq: Once | INTRAVENOUS | Status: AC
  Administered 2016-04-28: 11:00:00 1000 mg via INTRAVENOUS
  Filled 2016-04-28: qty 1100

## 2016-04-28 MED ORDER — LACTATED RINGERS IV SOLN
INTRAVENOUS | Status: DC
  Administered 2016-04-28 (×2): via INTRAVENOUS

## 2016-04-28 MED ORDER — PROPOFOL 10 MG/ML IV BOLUS
INTRAVENOUS | Status: AC
  Filled 2016-04-28: qty 60

## 2016-04-28 MED ORDER — BUPIVACAINE HCL 0.25 % IJ SOLN
INTRAMUSCULAR | Status: DC | PRN
  Administered 2016-04-28: 20 mL

## 2016-04-28 MED ORDER — MENTHOL 3 MG MT LOZG: 1.0000 | LOZENGE | OROMUCOSAL | Status: DC | PRN

## 2016-04-28 MED ORDER — SODIUM CHLORIDE 0.9 % IJ SOLN
INTRAMUSCULAR | Status: DC | PRN
  Administered 2016-04-28: 30 mL

## 2016-04-28 MED ORDER — LEVOTHYROXINE SODIUM 50 MCG PO TABS
50.0000 ug | ORAL_TABLET | Freq: Every day | ORAL | Status: DC
  Administered 2016-04-29 – 2016-04-30 (×2): 50 ug via ORAL
  Filled 2016-04-28 (×2): qty 1

## 2016-04-28 MED ORDER — SODIUM CHLORIDE 0.9 % IV SOLN
INTRAVENOUS | Status: DC
  Administered 2016-04-28 – 2016-04-29 (×2): via INTRAVENOUS

## 2016-04-28 MED ORDER — POLYETHYLENE GLYCOL 3350 17 G PO PACK
17.0000 g | PACK | Freq: Every day | ORAL | Status: DC | PRN
Start: 2016-04-28 — End: 2016-04-30

## 2016-04-28 MED ORDER — BISACODYL 10 MG RE SUPP: 10.0000 mg | Freq: Every day | RECTAL | Status: DC | PRN

## 2016-04-28 MED ORDER — ONDANSETRON HCL 4 MG/2ML IJ SOLN: 4.0000 mg | Freq: Four times a day (QID) | INTRAMUSCULAR | Status: DC | PRN

## 2016-04-28 MED ORDER — TRAMADOL HCL 50 MG PO TABS
50.0000 mg | ORAL_TABLET | Freq: Four times a day (QID) | ORAL | Status: DC | PRN
  Administered 2016-04-29: 13:00:00 100 mg via ORAL
  Filled 2016-04-28: qty 2

## 2016-04-28 MED ORDER — DIPHENHYDRAMINE HCL 12.5 MG/5ML PO ELIX: 12.5000 mg | ORAL_SOLUTION | ORAL | Status: DC | PRN

## 2016-04-28 MED ORDER — HYDROMORPHONE HCL 1 MG/ML IJ SOLN
INTRAMUSCULAR | Status: AC
  Administered 2016-04-28: 0.5 mg via INTRAVENOUS
  Filled 2016-04-28: qty 1

## 2016-04-28 MED ORDER — FENTANYL CITRATE (PF) 100 MCG/2ML IJ SOLN
INTRAMUSCULAR | Status: AC
  Filled 2016-04-28: qty 2

## 2016-04-28 MED ORDER — EPHEDRINE SULFATE 50 MG/ML IJ SOLN
INTRAMUSCULAR | Status: DC | PRN
  Administered 2016-04-28 (×3): 10 mg via INTRAVENOUS

## 2016-04-28 MED ORDER — BUPIVACAINE LIPOSOME 1.3 % IJ SUSP
INTRAMUSCULAR | Status: DC | PRN
  Administered 2016-04-28: 20 mL

## 2016-04-28 MED ORDER — HYDROMORPHONE HCL 1 MG/ML IJ SOLN
0.2500 mg | INTRAMUSCULAR | Status: DC | PRN
  Administered 2016-04-28 (×2): 0.5 mg via INTRAVENOUS

## 2016-04-28 MED ORDER — ACETAMINOPHEN 10 MG/ML IV SOLN
1000.0000 mg | Freq: Once | INTRAVENOUS | Status: AC
Start: 2016-04-28 — End: 2016-04-28
  Administered 2016-04-28: 1000 mg via INTRAVENOUS
  Filled 2016-04-28: qty 100

## 2016-04-28 MED ORDER — BUPIVACAINE LIPOSOME 1.3 % IJ SUSP
20.0000 mL | Freq: Once | INTRAMUSCULAR | Status: DC
  Filled 2016-04-28: qty 20

## 2016-04-28 MED ORDER — MORPHINE SULFATE (PF) 2 MG/ML IV SOLN: 1.0000 mg | INTRAVENOUS | Status: DC | PRN

## 2016-04-28 MED ORDER — DOCUSATE SODIUM 100 MG PO CAPS
100.0000 mg | ORAL_CAPSULE | Freq: Two times a day (BID) | ORAL | Status: DC
  Administered 2016-04-28 – 2016-04-30 (×4): 100 mg via ORAL
  Filled 2016-04-28 (×4): qty 1

## 2016-04-28 MED ORDER — METOCLOPRAMIDE HCL 5 MG/ML IJ SOLN: 5.0000 mg | Freq: Three times a day (TID) | INTRAMUSCULAR | Status: DC | PRN

## 2016-04-28 MED ORDER — ALBUTEROL SULFATE (2.5 MG/3ML) 0.083% IN NEBU: 2.5000 mg | INHALATION_SOLUTION | RESPIRATORY_TRACT | Status: DC | PRN

## 2016-04-28 MED ORDER — AMLODIPINE BESYLATE 5 MG PO TABS
2.5000 mg | ORAL_TABLET | Freq: Every day | ORAL | Status: DC
  Administered 2016-04-30: 10:00:00 2.5 mg via ORAL
  Filled 2016-04-28: qty 1

## 2016-04-28 MED ORDER — ONDANSETRON HCL 4 MG PO TABS: 4.0000 mg | ORAL_TABLET | Freq: Four times a day (QID) | ORAL | Status: DC | PRN

## 2016-04-28 MED ORDER — VANCOMYCIN HCL IN DEXTROSE 1-5 GM/200ML-% IV SOLN
1000.0000 mg | Freq: Two times a day (BID) | INTRAVENOUS | Status: AC
  Administered 2016-04-28: 1000 mg via INTRAVENOUS
  Filled 2016-04-28: qty 200

## 2016-04-28 MED ORDER — METHOCARBAMOL 1000 MG/10ML IJ SOLN
500.0000 mg | Freq: Four times a day (QID) | INTRAVENOUS | Status: DC | PRN
  Administered 2016-04-28: 500 mg via INTRAVENOUS
  Filled 2016-04-28: qty 550
  Filled 2016-04-28: qty 5

## 2016-04-28 MED ORDER — DEXAMETHASONE SODIUM PHOSPHATE 10 MG/ML IJ SOLN
10.0000 mg | Freq: Once | INTRAMUSCULAR | Status: AC
  Administered 2016-04-29: 10:00:00 10 mg via INTRAVENOUS
  Filled 2016-04-28: qty 1

## 2016-04-28 MED ORDER — ONDANSETRON HCL 4 MG/2ML IJ SOLN
INTRAMUSCULAR | Status: DC | PRN
  Administered 2016-04-28: 4 mg via INTRAVENOUS

## 2016-04-28 MED ORDER — PROPOFOL 10 MG/ML IV BOLUS
INTRAVENOUS | Status: DC | PRN
  Administered 2016-04-28: 20 mg via INTRAVENOUS
  Administered 2016-04-28: 10 mg via INTRAVENOUS
  Administered 2016-04-28: 80 mg via INTRAVENOUS

## 2016-04-28 MED ORDER — DEXAMETHASONE SODIUM PHOSPHATE 10 MG/ML IJ SOLN
INTRAMUSCULAR | Status: AC
  Filled 2016-04-28: qty 1

## 2016-04-28 MED ORDER — ACETAMINOPHEN 500 MG PO TABS
1000.0000 mg | ORAL_TABLET | Freq: Four times a day (QID) | ORAL | Status: AC
  Administered 2016-04-28 – 2016-04-29 (×4): 1000 mg via ORAL
  Filled 2016-04-28 (×4): qty 2

## 2016-04-28 MED ORDER — ONDANSETRON HCL 4 MG/2ML IJ SOLN
INTRAMUSCULAR | Status: AC
  Filled 2016-04-28: qty 2

## 2016-04-28 MED ORDER — OXYCODONE HCL 5 MG PO TABS
5.0000 mg | ORAL_TABLET | ORAL | Status: DC | PRN
  Administered 2016-04-28 (×4): 5 mg via ORAL
  Administered 2016-04-29 (×3): 10 mg via ORAL
  Administered 2016-04-30: 08:00:00 5 mg via ORAL
  Filled 2016-04-28: qty 2
  Filled 2016-04-28: qty 1
  Filled 2016-04-28 (×4): qty 2
  Filled 2016-04-28 (×2): qty 1

## 2016-04-28 MED ORDER — ACETAMINOPHEN 10 MG/ML IV SOLN
INTRAVENOUS | Status: AC
  Filled 2016-04-28: qty 100

## 2016-04-28 MED ORDER — ACETAMINOPHEN 325 MG PO TABS: 650.0000 mg | ORAL_TABLET | Freq: Four times a day (QID) | ORAL | Status: DC | PRN

## 2016-04-28 MED ORDER — FENTANYL CITRATE (PF) 100 MCG/2ML IJ SOLN
INTRAMUSCULAR | Status: DC | PRN
  Administered 2016-04-28 (×4): 25 ug via INTRAVENOUS

## 2016-04-28 MED ORDER — ALBUTEROL SULFATE HFA 108 (90 BASE) MCG/ACT IN AERS: 1.0000 | INHALATION_SPRAY | RESPIRATORY_TRACT | Status: DC | PRN

## 2016-04-28 MED ORDER — FLEET ENEMA 7-19 GM/118ML RE ENEM: 1.0000 | ENEMA | Freq: Once | RECTAL | Status: DC | PRN

## 2016-04-28 MED ORDER — RIVAROXABAN 10 MG PO TABS
10.0000 mg | ORAL_TABLET | Freq: Every day | ORAL | Status: DC
  Administered 2016-04-29 – 2016-04-30 (×2): 10 mg via ORAL
  Filled 2016-04-28 (×2): qty 1

## 2016-04-28 MED ORDER — SODIUM CHLORIDE 0.9 % IR SOLN
Status: DC | PRN
  Administered 2016-04-28: 1000 mL

## 2016-04-28 MED ORDER — VANCOMYCIN HCL IN DEXTROSE 1-5 GM/200ML-% IV SOLN
1000.0000 mg | INTRAVENOUS | Status: AC
  Administered 2016-04-28: 1000 mg via INTRAVENOUS
  Filled 2016-04-28: qty 200

## 2016-04-28 MED ORDER — LIDOCAINE 2% (20 MG/ML) 5 ML SYRINGE
INTRAMUSCULAR | Status: DC | PRN
  Administered 2016-04-28: 40 mg via INTRAVENOUS

## 2016-04-28 MED ORDER — METOPROLOL SUCCINATE ER 50 MG PO TB24
50.0000 mg | ORAL_TABLET | Freq: Every day | ORAL | Status: DC
  Administered 2016-04-30: 50 mg via ORAL
  Filled 2016-04-28 (×2): qty 1

## 2016-04-28 MED ORDER — TRANEXAMIC ACID 1000 MG/10ML IV SOLN
1000.0000 mg | INTRAVENOUS | Status: AC
  Administered 2016-04-28: 1000 mg via INTRAVENOUS
  Filled 2016-04-28: qty 1100

## 2016-04-28 MED ORDER — SODIUM CHLORIDE 0.9 % IJ SOLN
INTRAMUSCULAR | Status: AC
  Filled 2016-04-28: qty 50

## 2016-04-28 MED ORDER — DEXAMETHASONE SODIUM PHOSPHATE 10 MG/ML IJ SOLN
10.0000 mg | Freq: Once | INTRAMUSCULAR | Status: AC
  Administered 2016-04-28: 10 mg via INTRAVENOUS

## 2016-04-28 SURGICAL SUPPLY — 53 items
BAG DECANTER FOR FLEXI CONT (MISCELLANEOUS) ×3 IMPLANT
BAG SPEC THK2 15X12 ZIP CLS (MISCELLANEOUS) ×1
BAG ZIPLOCK 12X15 (MISCELLANEOUS) ×3 IMPLANT
BANDAGE ACE 6X5 VEL STRL LF (GAUZE/BANDAGES/DRESSINGS) ×3 IMPLANT
BLADE SAG 18X100X1.27 (BLADE) ×3 IMPLANT
BLADE SAW SGTL 11.0X1.19X90.0M (BLADE) ×3 IMPLANT
BOWL SMART MIX CTS (DISPOSABLE) ×3 IMPLANT
CAPT KNEE TOTAL 3 ATTUNE ×2 IMPLANT
CEMENT HV SMART SET (Cement) ×6 IMPLANT
CLOSURE WOUND 1/2 X4 (GAUZE/BANDAGES/DRESSINGS) ×2
CLOTH BEACON ORANGE TIMEOUT ST (SAFETY) ×3 IMPLANT
CUFF TOURN SGL QUICK 34 (TOURNIQUET CUFF) ×3
CUFF TRNQT CYL 34X4X40X1 (TOURNIQUET CUFF) ×1 IMPLANT
DECANTER SPIKE VIAL GLASS SM (MISCELLANEOUS) ×3 IMPLANT
DRAPE U-SHAPE 47X51 STRL (DRAPES) ×3 IMPLANT
DRSG ADAPTIC 3X8 NADH LF (GAUZE/BANDAGES/DRESSINGS) ×3 IMPLANT
DRSG PAD ABDOMINAL 8X10 ST (GAUZE/BANDAGES/DRESSINGS) ×3 IMPLANT
DURAPREP 26ML APPLICATOR (WOUND CARE) ×3 IMPLANT
ELECT REM PT RETURN 9FT ADLT (ELECTROSURGICAL) ×3
ELECTRODE REM PT RTRN 9FT ADLT (ELECTROSURGICAL) ×1 IMPLANT
EVACUATOR 1/8 PVC DRAIN (DRAIN) ×3 IMPLANT
GAUZE SPONGE 4X4 12PLY STRL (GAUZE/BANDAGES/DRESSINGS) ×3 IMPLANT
GLOVE BIO SURGEON STRL SZ7.5 (GLOVE) IMPLANT
GLOVE BIO SURGEON STRL SZ8 (GLOVE) ×3 IMPLANT
GLOVE BIOGEL PI IND STRL 6.5 (GLOVE) IMPLANT
GLOVE BIOGEL PI IND STRL 8 (GLOVE) ×1 IMPLANT
GLOVE BIOGEL PI INDICATOR 6.5 (GLOVE) ×2
GLOVE BIOGEL PI INDICATOR 8 (GLOVE) ×2
GLOVE SURG SS PI 6.5 STRL IVOR (GLOVE) ×2 IMPLANT
GOWN STRL REUS W/TWL LRG LVL3 (GOWN DISPOSABLE) ×5 IMPLANT
GOWN STRL REUS W/TWL XL LVL3 (GOWN DISPOSABLE) IMPLANT
HANDPIECE INTERPULSE COAX TIP (DISPOSABLE) ×3
IMMOBILIZER KNEE 20 (SOFTGOODS) ×3
IMMOBILIZER KNEE 20 THIGH 36 (SOFTGOODS) ×1 IMPLANT
MANIFOLD NEPTUNE II (INSTRUMENTS) ×3 IMPLANT
NS IRRIG 1000ML POUR BTL (IV SOLUTION) ×3 IMPLANT
PACK TOTAL KNEE CUSTOM (KITS) ×3 IMPLANT
PADDING CAST COTTON 6X4 STRL (CAST SUPPLIES) ×9 IMPLANT
POSITIONER SURGICAL ARM (MISCELLANEOUS) ×3 IMPLANT
SET HNDPC FAN SPRY TIP SCT (DISPOSABLE) ×1 IMPLANT
STRIP CLOSURE SKIN 1/2X4 (GAUZE/BANDAGES/DRESSINGS) ×4 IMPLANT
SUT MNCRL AB 4-0 PS2 18 (SUTURE) ×3 IMPLANT
SUT STRATAFIX 0 PDS 27 VIOLET (SUTURE) ×3
SUT VIC AB 2-0 CT1 27 (SUTURE) ×12
SUT VIC AB 2-0 CT1 TAPERPNT 27 (SUTURE) ×3 IMPLANT
SUT VLOC 180 0 24IN GS25 (SUTURE) ×3 IMPLANT
SUTURE STRATFX 0 PDS 27 VIOLET (SUTURE) IMPLANT
SYR 50ML LL SCALE MARK (SYRINGE) ×3 IMPLANT
TRAY FOLEY CATH 14FRSI W/METER (CATHETERS) ×2 IMPLANT
TRAY FOLEY W/METER SILVER 16FR (SET/KITS/TRAYS/PACK) ×1 IMPLANT
WATER STERILE IRR 1500ML POUR (IV SOLUTION) ×3 IMPLANT
WRAP KNEE MAXI GEL POST OP (GAUZE/BANDAGES/DRESSINGS) ×3 IMPLANT
YANKAUER SUCT BULB TIP 10FT TU (MISCELLANEOUS) ×3 IMPLANT

## 2016-04-28 NOTE — Anesthesia Postprocedure Evaluation (Addendum)
Anesthesia Post Note  Patient: [name]  Procedure(s) Performed: Procedure(s) (LRB): LEFT TOTAL KNEE ARTHROPLASTY (Left)  Patient location during evaluation: PACU Anesthesia Type: MAC Level of consciousness: awake, lethargic, awake and alert and oriented Pain management: pain level controlled Vital Signs Assessment: post-procedure vital signs reviewed and stable Respiratory status: spontaneous breathing Cardiovascular status: blood pressure returned to baseline Anesthetic complications: no       Last Vitals:  Vitals:   04/28/16 1150 04/28/16 1245  BP: 126/67 120/71  Pulse: (!) 58 62  Resp: 15 16  Temp: 36.5 C 36.4 C    Last Pain:  Vitals:   04/28/16 1245  TempSrc: Axillary  PainSc:                  [name]

## 2016-04-28 NOTE — Anesthesia Procedure Notes (Signed)
Spinal  Start time: 04/28/2016 7:15 AM End time: 04/28/2016 7:20 AM Staffing Anesthesiologist: Noreene LarssonJOSLIN, Doris Bastedo Preanesthetic Checklist Completed: patient identified, site marked, surgical consent, pre-op evaluation, timeout performed, IV checked, risks and benefits discussed and monitors and equipment checked Spinal Block Patient position: sitting Prep: Betadine Patient monitoring: heart rate, continuous pulse ox and blood pressure Approach: midline Location: L3-4 Injection technique: single-shot Needle Needle type: Quincke  Needle gauge: 24 G Needle insertion depth: 5 cm Additional Notes 12 mg 0.75% Bupivacaine injected easily

## 2016-04-28 NOTE — Anesthesia Preprocedure Evaluation (Addendum)
Anesthesia Evaluation  Patient identified by MRN, date of birth, ID band Patient awake    Reviewed: Allergy & Precautions, NPO status , Patient's Chart, lab work & pertinent test results  Airway Mallampati: II  TM Distance: >3 FB Neck ROM: Full    Dental  (+) Teeth Intact, Dental Advisory Given   Pulmonary    breath sounds clear to auscultation       Cardiovascular hypertension,  Rhythm:Regular Rate:Normal     Neuro/Psych    GI/Hepatic   Endo/Other    Renal/GU      Musculoskeletal   Abdominal   Peds  Hematology   Anesthesia Other Findings   Reproductive/Obstetrics                            Anesthesia Physical Anesthesia Plan  ASA: III  Anesthesia Plan: MAC and Spinal   Post-op Pain Management:    Induction:   Airway Management Planned: Natural Airway and Nasal Cannula  Additional Equipment:   Intra-op Plan:   Post-operative Plan:   Informed Consent: I have reviewed the patients History and Physical, chart, labs and discussed the procedure including the risks, benefits and alternatives for the proposed anesthesia with the patient or authorized representative who has indicated his/her understanding and acceptance.   Dental advisory given  Plan Discussed with: CRNA and Anesthesiologist  Anesthesia Plan Comments:         Anesthesia Quick Evaluation

## 2016-04-28 NOTE — Anesthesia Procedure Notes (Addendum)
Procedure Name: LMA Insertion Date/Time: 04/28/2016 7:37 AM Performed by: Delphia GratesHANDLER, Imaad Reuss Pre-anesthesia Checklist: Emergency Drugs available, Suction available, Patient being monitored and Patient identified Patient Re-evaluated:Patient Re-evaluated prior to inductionOxygen Delivery Method: Circle system utilized Preoxygenation: Pre-oxygenation with 100% oxygen Intubation Type: IV induction LMA Size: 4.0 Placement Confirmation: positive ETCO2 Tube secured with: Tape Dental Injury: Teeth and Oropharynx as per pre-operative assessment  Comments: Upper teeth cracked preop

## 2016-04-28 NOTE — Interval H&P Note (Signed)
History and Physical Interval Note:  04/28/2016 6:39 AM  [name]  has presented today for surgery, with the diagnosis of LEFT KNEE OA  The various methods of treatment have been discussed with the patient and family. After consideration of risks, benefits and other options for treatment, the patient has consented to  Procedure(s): LEFT TOTAL KNEE ARTHROPLASTY (Left) as a surgical intervention .  The patient's history has been reviewed, patient examined, no change in status, stable for surgery.  I have reviewed the patient's chart and labs.  Questions were answered to the patient's satisfaction.     Loanne DrillingALUISIO,Finnbar Cedillos V

## 2016-04-28 NOTE — Anesthesia Procedure Notes (Signed)
Procedure Name: MAC Date/Time: 04/28/2016 7:14 AM Performed by: Dione Booze Pre-anesthesia Checklist: Patient identified, Emergency Drugs available, Suction available and Patient being monitored Patient Re-evaluated:Patient Re-evaluated prior to inductionOxygen Delivery Method: Simple face mask Placement Confirmation: positive ETCO2

## 2016-04-28 NOTE — Transfer of Care (Signed)
Immediate Anesthesia Transfer of Care Note  Patient: Pennie RushingBetty R Stgermain  Procedure(s) Performed: Procedure(s): LEFT TOTAL KNEE ARTHROPLASTY (Left)  Patient Location: PACU  Anesthesia Type:General  Level of Consciousness: awake, alert , oriented and patient cooperative  Airway & Oxygen Therapy: Patient Spontanous Breathing and Patient connected to face mask oxygen  Post-op Assessment: Report given to RN and Post -op Vital signs reviewed and stable  Post vital signs: Reviewed and stable  Last Vitals:  Vitals:   04/28/16 0519  BP: 129/77  Pulse: 69  Resp: 16  Temp: 36.7 C    Last Pain:  Vitals:   04/28/16 0519  TempSrc: Oral      Patients Stated Pain Goal: 4 (04/28/16 0549)  Complications: No apparent anesthesia complications

## 2016-04-28 NOTE — H&P (View-Only) (Signed)
[name] DOB: 04/12/1940 Married / Language: English / Race: White Female Date of Admission:  04/28/2016 CC:  Left knee pain History of Present Illness  The patient is a 76 year old female who comes infor a preoperative History and Physical. The patient is scheduled for a left total knee arthroplasty to be performed by Dr. Gus RankinFrank V. Aluisio, MD at [facility] on 04-28-2016. The patient is a 76 year old female who presented for a Follow-up for Follow-up Knee. The patient is being followed for their left knee pain and osteoarthritis. Symptoms reported include: instability (on/off), while the patient does not report symptoms of: pain (has improved since injection 09-04-15 by Dr Margaretha Sheffieldraper), pain with weightbearing or difficulty ambulating. Current treatment includes: home exercise program (walks) and NSAIDs. The following medication has been used for pain control: antiinflammatory medication (Ibuprofen). The patient has reported improvement of their symptoms with: Cortisone injections. When I saw [name] last she did have a bad flare of pain in her knee and she got an injection with Dr. Margaretha Sheffieldraper. She was ssen recently to discuss future treatment options for her knee. She realizes that the cortisone is not going to last forever. She is now at a point where she would like to get a more permanent fix for the knee. Total knee surgery had been discussed with the patient and she would like to proceed witht the surgery. They have been treated conservatively in the past for the above stated problem and despite conservative measures, they continue to have progressive pain and severe functional limitations and dysfunction. They have failed non-operative management including home exercise, medications, and injections. It is felt that they would benefit from undergoing total joint replacement. Risks and benefits of the procedure have been discussed with the patient and they elect to proceed with surgery. There are  no active contraindications to surgery such as ongoing infection or rapidly progressive neurological disease.   Problem List/Past Medical Localized osteoarthritis of left knee (M17.12)  Cardiac Arrhythmia  Hepatitis C  High blood pressure  Hypercholesterolemia  Hypothyroidism  Multinodular Goiter  Migraine Headache  Osteoarthritis  Arthritis of left knee (M17.12)  Measles  Mumps  Menopause  History of GI Bleed  History of Transfusion (Post GI Bleed) - 1990  Duodenal Ulcer  History of PACs, PVCs Arrhythmia   Allergies Penicillin G Pot in Dextrose *PENICILLINS*  Demerol *ANALGESICS - OPIOID*  Macrobid *URINARY ANTI-INFECTIVES*  Griseofulvin Microsize *ANTIFUNGALS*   Family History Cerebrovascular Accident  Brother, Maternal Grandmother. Drug / Alcohol Addiction  Brother, Father. Heart Disease  Father, Mother. Hypertension  Brother, Father, Maternal Grandmother, Mother. Osteoarthritis  Brother, Father, Mother. Osteoporosis  Mother. Siblings  Brother - deceased due to complications following total joint procedure, history on intracranial bleed.  Social History Children  3 Current drinker  07/12/2015: Currently drinks wine 5-7 times per week Current work status  retired [employer] Exercise  Exercises daily; does running / walking Living situation  live with spouse Marital status  married No history of drug/alcohol rehab  Not under pain contract  Number of flights of stairs before winded  2-3 Tobacco / smoke exposure  07/12/2015: no Tobacco use  Never smoker. 07/12/2015  Medication History  Pravastatin Sodium (20MG  Tablet, Oral) Active. Levothyroxine Sodium (25MCG Tablet, Oral) Active. Metoprolol Succinate ER (50MG  Tablet ER 24HR, Oral) Active. AmLODIPine Besylate (2.5MG  Tablet, Oral) Active. Ibuprofen (Oral) Specific strength unknown - Active. (PRN) Vitamin D Active. Rescue Albuterol Inhaler Active.  Past Surgical History   Carpal Tunnel  Repair  right Tubal Ligation     Review of Systems General Not Present- Chills, Fatigue, Fever, Memory Loss, Night Sweats, Weight Gain and Weight Loss. Skin Not Present- Eczema, Hives, Itching, Lesions and Rash. HEENT Not Present- Dentures, Double Vision, Headache, Hearing Loss, Tinnitus and Visual Loss. Respiratory Not Present- Allergies, Chronic Cough, Coughing up blood, Shortness of breath at rest and Shortness of breath with exertion. Cardiovascular Not Present- Chest Pain, Difficulty Breathing Lying Down, Murmur, Palpitations, Racing/skipping heartbeats and Swelling. Gastrointestinal Not Present- Abdominal Pain, Bloody Stool, Constipation, Diarrhea, Difficulty Swallowing, Heartburn, Jaundice, Loss of appetitie, Nausea and Vomiting. Female Genitourinary Not Present- Blood in Urine, Discharge, Flank Pain, Incontinence, Painful Urination, Urgency, Urinary frequency, Urinary Retention, Urinating at Night and Weak urinary stream. Musculoskeletal Present- Joint Pain and Joint Swelling. Not Present- Back Pain, Morning Stiffness, Muscle Pain, Muscle Weakness and Spasms. Neurological Not Present- Blackout spells, Difficulty with balance, Dizziness, Paralysis, Tremor and Weakness. Psychiatric Not Present- Insomnia.  Vitals Weight: 168 lb Height: 66in Body Surface Area: 1.86 m Body Mass Index: 27.12 kg/m  Pulse: 60 (Regular)  BP: 118/74 (Sitting, Right Arm, Standard)    Physical Exam  General Mental Status -Alert, cooperative and good historian. General Appearance-pleasant, Not in acute distress. Orientation-Oriented X3. Build & Nutrition-Well nourished and Well developed.  Head and Neck Head-normocephalic, atraumatic . Neck Global Assessment - supple, no bruit auscultated on the right, no bruit auscultated on the left.  Eye Vision-Wears corrective lenses. Pupil - Bilateral-Regular and Round. Motion - Bilateral-EOMI.  Chest and Lung  Exam Auscultation Breath sounds - clear at anterior chest wall and clear at posterior chest wall. Adventitious sounds - No Adventitious sounds.  Cardiovascular Auscultation Rhythm - Regular rate and rhythm. Heart Sounds - S1 WNL and S2 WNL. Murmurs & Other Heart Sounds - Auscultation of the heart reveals - No Murmurs.  Abdomen Palpation/Percussion Tenderness - Abdomen is non-tender to palpation. Rigidity (guarding) - Abdomen is soft. Auscultation Auscultation of the abdomen reveals - Bowel sounds normal.  Female Genitourinary Note: Not done, not pertinent to present illness   Musculoskeletal Note: She is alert and oriented, in no apparent distress. Her left knee shows no effusion. There is moderate crepitus on range of motion in the knee with tenderness medial greater than lateral. No instability. Range about 0 to 125.     Assessment & Plan  Localized osteoarthritis of left knee (M17.12)  Note:Surgical Plans: Left Total Knee Replacement  Disposition: Home  PCP: Shirlean Mylar  IV TXA  Anesthesia Issues: None  Patient was instructed on what medications to stop prior to surgery.  Signed electronically by Beckey Rutter, III PA-

## 2016-04-28 NOTE — Evaluation (Signed)
Physical Therapy Evaluation Patient Details Name: KAEDYNCE TAPP MRN: [id number] DOB: 06-04-1940 Today's Date: 04/28/2016   History of Present Illness  L tka  Clinical Impression  The patient tolerated ambulating x 50. Should progress well. Pt admitted with above diagnosis. Pt currently with functional limitations due to the deficits listed below (see PT Problem List).  Pt will benefit from skilled PT to increase their independence and safety with mobility to allow discharge to the venue listed below.       Follow Up Recommendations Home health PT    Equipment Recommendations  Rolling walker with 5" wheels    Recommendations for Other Services       Precautions / Restrictions Precautions Precautions: Knee Required Braces or Orthoses: Knee Immobilizer - Left Knee Immobilizer - Left: Discontinue once straight leg raise with < 10 degree lag      Mobility  Bed Mobility Overal bed mobility: Needs Assistance Bed Mobility: Supine to Sit     Supine to sit: Min guard     General bed mobility comments: cues for technique,patient  managed the L leg   Transfers Overall transfer level: Needs assistance Equipment used: Rolling walker (2 wheeled) Transfers: Sit to/from Stand Sit to Stand: Min assist         General transfer comment: cues for hand and  left leg  Ambulation/Gait Ambulation/Gait assistance: Min assist Ambulation Distance (Feet): 50 Feet Assistive device: Rolling walker (2 wheeled) Gait Pattern/deviations: Step-to pattern;Step-through pattern     General Gait Details: cues for sequence  Stairs            Wheelchair Mobility    Modified Rankin (Stroke Patients Only)       Balance                                             Pertinent Vitals/Pain Pain Assessment: 0-10 Pain Score: 3  Pain Descriptors / Indicators: Sore Pain Intervention(s): Monitored during session;Premedicated before session;Repositioned;Ice applied     Home Living Family/patient expects to be discharged to:: Private residence Living Arrangements: Spouse/significant other Available Help at Discharge: Family   Home Access: Stairs to enter Entrance Stairs-Rails: None Secretary/administrator of Steps: 2 Home Layout: One level Home Equipment: None      Prior Function                 Hand Dominance        Extremity/Trunk Assessment   Upper Extremity Assessment Upper Extremity Assessment: Overall WFL for tasks assessed    Lower Extremity Assessment Lower Extremity Assessment: LLE deficits/detail LLE Deficits / Details: able to SLR.       Communication      Cognition Arousal/Alertness: Awake/alert Behavior During Therapy: WFL for tasks assessed/performed Overall Cognitive Status: Within Functional Limits for tasks assessed                      General Comments      Exercises     Assessment/Plan    PT Assessment Patient needs continued PT services  PT Problem List Decreased strength;Decreased range of motion;Decreased activity tolerance;Decreased mobility;Decreased knowledge of precautions;Decreased safety awareness;Decreased knowledge of use of DME          PT Treatment Interventions DME instruction;Gait training;Stair training;Functional mobility training;Therapeutic activities;Patient/family education    PT Goals (Current goals can be found in the Care Plan section)  Acute Rehab PT Goals Patient Stated Goal: to go home PT Goal Formulation: With patient/family Time For Goal Achievement: 05/01/16 Potential to Achieve Goals: Good    Frequency 7X/week   Barriers to discharge        Co-evaluation               End of Session Equipment Utilized During Treatment: Left knee immobilizer Activity Tolerance: Patient tolerated treatment well Patient left: in chair;with call bell/phone within reach;with family/visitor present Nurse Communication: Mobility status         Time:  [id number] PT Time Calculation (min) (ACUTE ONLY): 29 min   Charges:   PT Evaluation $PT Eval Low Complexity: 1 Procedure PT Treatments $Gait Training: 8-22 mins   PT G Codes:        Rada HayHill, Chasyn Cinque [name] 04/28/2016, 6:10 PM

## 2016-04-28 NOTE — Op Note (Signed)
OPERATIVE REPORT-TOTAL KNEE ARTHROPLASTY   Pre-operative diagnosis- Osteoarthritis  Left knee(s)  Post-operative diagnosis- Osteoarthritis Left knee(s)  Procedure-  Left  Total Knee Arthroplasty (Depuy Attune)  Surgeon- Gus RankinFrank V. Cassie Shedlock, MD  Assistant- Dimitri PedAmber Constable, PA-C   Anesthesia-  General  EBL-* No blood loss amount entered *   Drains Hemovac  Tourniquet time-  Total Tourniquet Time Documented: Thigh (Left) - 37 minutes Total: Thigh (Left) - 37 minutes     Complications- None  Condition-PACU - hemodynamically stable.   Brief Clinical Note  [name] is a 76 y.o. year old female with end stage OA of her left knee with progressively worsening pain and dysfunction. She has constant pain, with activity and at rest and significant functional deficits with difficulties even with ADLs. She has had extensive non-op management including analgesics, injections of cortisone and viscosupplements, and home exercise program, but remains in significant pain with significant dysfunction. Radiographs show bone on bone arthritis medial and patellofemoral. She presents now for left Total Knee Arthroplasty.     Procedure in detail---   The patient is brought into the operating room and positioned supine on the operating table. After successful administration of  General,   a tourniquet is placed high on the  Left thigh(s) and the lower extremity is prepped and draped in the usual sterile fashion. Time out is performed by the operating team and then the  Left lower extremity is wrapped in Esmarch, knee flexed and the tourniquet inflated to 300 mmHg.       A midline incision is made with a ten blade through the subcutaneous tissue to the level of the extensor mechanism. A fresh blade is used to make a medial parapatellar arthrotomy. Soft tissue over the proximal medial tibia is subperiosteally elevated to the joint line with a knife and into the semimembranosus bursa with a Cobb  elevator. Soft tissue over the proximal lateral tibia is elevated with attention being paid to avoiding the patellar tendon on the tibial tubercle. The patella is everted, knee flexed 90 degrees and the ACL and PCL are removed. Findings are bone on bone medial and patellofemoral with large global osteophytes.      The drill is used to create a starting hole in the distal femur and the canal is thoroughly irrigated with sterile saline to remove the fatty contents. The 5 degree Left  valgus alignment guide is placed into the femoral canal and the distal femoral cutting block is pinned to remove 9 mm off the distal femur. Resection is made with an oscillating saw.      The tibia is subluxed forward and the menisci are removed. The extramedullary alignment guide is placed referencing proximally at the medial aspect of the tibial tubercle and distally along the second metatarsal axis and tibial crest. The block is pinned to remove 2mm off the more deficient medial  side. Resection is made with an oscillating saw. Size 6is the most appropriate size for the tibia and the proximal tibia is prepared with the modular drill and keel punch for that size.      The femoral sizing guide is placed and size 6 is most appropriate. Rotation is marked off the epicondylar axis and confirmed by creating a rectangular flexion gap at 90 degrees. The size 6 cutting block is pinned in this rotation and the anterior, posterior and chamfer cuts are made with the oscillating saw. The intercondylar block is then placed and that cut is made.  Trial size 6 tibial component, trial size 6 posterior stabilized femur and a 10  mm posterior stabilized rotating platform insert trial is placed. Full extension is achieved with excellent varus/valgus and anterior/posterior balance throughout full range of motion. The patella is everted and thickness measured to be 24  mm. Free hand resection is taken to 14 mm, a 38 template is placed, lug holes are  drilled, trial patella is placed, and it tracks normally. Osteophytes are removed off the posterior femur with the trial in place. All trials are removed and the cut bone surfaces prepared with pulsatile lavage. Cement is mixed and once ready for implantation, the size 6 tibial implant, size  6 posterior stabilized femoral component, and the size 38 patella are cemented in place and the patella is held with the clamp. The trial insert is placed and the knee held in full extension. The Exparel (20 ml mixed with 30 ml saline) and .25% Bupivicaine, are injected into the extensor mechanism, posterior capsule, medial and lateral gutters and subcutaneous tissues.  All extruded cement is removed and once the cement is hard the permanent 10 mm posterior stabilized rotating platform insert is placed into the tibial tray.      The wound is copiously irrigated with saline solution and the extensor mechanism closed over a hemovac drain with #1 V-loc suture. The tourniquet is released for a total tourniquet time of 37  minutes. Flexion against gravity is 140 degrees and the patella tracks normally. Subcutaneous tissue is closed with 2.0 vicryl and subcuticular with running 4.0 Monocryl. The incision is cleaned and dried and steri-strips and a bulky sterile dressing are applied. The limb is placed into a knee immobilizer and the patient is awakened and transported to recovery in stable condition.      Please note that a surgical assistant was a medical necessity for this procedure in order to perform it in a safe and expeditious manner. Surgical assistant was necessary to retract the ligaments and vital neurovascular structures to prevent injury to them and also necessary for proper positioning of the limb to allow for anatomic placement of the prosthesis.   Gus Rankin Vantasia Pinkney, MD    04/28/2016, 8:18 AM

## 2016-04-29 LAB — BASIC METABOLIC PANEL
Anion gap: 5 (ref 5–15)
BUN: 10 mg/dL (ref 6–20)
CALCIUM: 8.5 mg/dL — AB (ref 8.9–10.3)
CO2: 27 mmol/L (ref 22–32)
CREATININE: 0.48 mg/dL (ref 0.44–1.00)
Chloride: 107 mmol/L (ref 101–111)
GFR calc non Af Amer: >60 mL/min (ref >60)
Glucose, Bld: 125 mg/dL — ABNORMAL HIGH (ref 65–99)
Potassium: 4.1 mmol/L (ref 3.5–5.1)
Sodium: 139 mmol/L (ref 135–145)

## 2016-04-29 LAB — CBC
HEMATOCRIT: 27.6 % — AB (ref 36.0–46.0)
Hemoglobin: 9.4 g/dL — ABNORMAL LOW (ref 12.0–15.0)
MCH: 30.3 pg (ref 26.0–34.0)
MCHC: 34.1 g/dL (ref 30.0–36.0)
MCV: 89 fL (ref 78.0–100.0)
PLATELETS: 131 10*3/uL — AB (ref 150–400)
RBC: 3.1 MIL/uL — ABNORMAL LOW (ref 3.87–5.11)
RDW: 13.5 % (ref 11.5–15.5)
WBC: 8.7 10*3/uL (ref 4.0–10.5)

## 2016-04-29 MED ORDER — OXYCODONE HCL 5 MG PO TABS: 5.0000 mg | ORAL_TABLET | ORAL | 0 refills | Status: DC | PRN

## 2016-04-29 MED ORDER — SODIUM CHLORIDE 0.9 % IV BOLUS (SEPSIS)
250.0000 mL | Freq: Once | INTRAVENOUS | Status: AC
  Administered 2016-04-29: 15:00:00 250 mL via INTRAVENOUS

## 2016-04-29 MED ORDER — TRAMADOL HCL 50 MG PO TABS: 50.0000 mg | ORAL_TABLET | Freq: Four times a day (QID) | ORAL | 0 refills | Status: DC | PRN

## 2016-04-29 MED ORDER — METHOCARBAMOL 500 MG PO TABS: 500.0000 mg | ORAL_TABLET | Freq: Four times a day (QID) | ORAL | 0 refills | Status: DC | PRN

## 2016-04-29 MED ORDER — SODIUM CHLORIDE 0.9 % IV BOLUS (SEPSIS)
250.0000 mL | Freq: Once | INTRAVENOUS | Status: AC
  Administered 2016-04-29: 250 mL via INTRAVENOUS

## 2016-04-29 MED ORDER — RIVAROXABAN 10 MG PO TABS: 10.0000 mg | ORAL_TABLET | Freq: Every day | ORAL | 0 refills | Status: DC

## 2016-04-29 NOTE — Progress Notes (Signed)
Physical Therapy Treatment Patient Details Name: [name] MRN: [id number] DOB: [birth year] Today's Date: 04/29/2016    History of Present Illness L tka    PT Comments    The patient tolerated very well. No dizziness noted.  The  Patient  Plans to DC to home with HHPT vs OPPT.   Follow Up Recommendations  Home health PT     Equipment Recommendations  Rolling walker with 5" wheels    Recommendations for Other Services       Precautions / Restrictions Precautions Precautions: Knee Precaution Comments: did not wear KI for ambulation Required Braces or Orthoses: Knee Immobilizer - Left Knee Immobilizer - Left: Discontinue once straight leg raise with < 10 degree lag    Mobility  Bed Mobility Overal bed mobility: Needs Assistance Bed Mobility: Sit to Supine     Supine to sit: Supervision Sit to supine: Min assist   General bed mobility comments: cues for technique, assist to managed the leg   Transfers Overall transfer level: Needs assistance Equipment used: Rolling walker (2 wheeled) Transfers: Sit to/from Stand Sit to Stand: Supervision Stand pivot transfers: Supervision       General transfer comment: cues for hand and  left leg  Ambulation/Gait Ambulation/Gait assistance: Min guard Ambulation Distance (Feet): 100 Feet Assistive device: Rolling walker (2 wheeled) Gait Pattern/deviations: Step-to pattern;Step-through pattern     General Gait Details: cues for sequence   Stairs            Wheelchair Mobility    Modified Rankin (Stroke Patients Only)       Balance                                    Cognition Arousal/Alertness: Awake/alert Behavior During Therapy: WFL for tasks assessed/performed Overall Cognitive Status: Within Functional Limits for tasks assessed                      Exercises Total Joint Exercises Ankle Circles/Pumps: AROM;Both;10 reps Quad Sets: AROM;Both;10 reps Short Arc Quad:  AROM;Left;10 reps Heel Slides: AROM;Left;10 reps Hip ABduction/ADduction: AROM;Left;10 reps Straight Leg Raises: AROM;Left;10 reps Goniometric ROM: 10-90 left    General Comments        Pertinent Vitals/Pain Pain Assessment: 0-10 Pain Score: 3  Pain Location: r knee Pain Descriptors / Indicators: Sore;Aching Pain Intervention(s): Premedicated before session;Repositioned    Home Living Family/patient expects to be discharged to:: Private residence Living Arrangements: Spouse/significant other Available Help at Discharge: Family   Home Access: Stairs to enter Entrance Stairs-Rails: None Home Layout: One level Home Equipment: None      Prior Function Level of Independence: Independent          PT Goals (current goals can now be found in the care plan section) Acute Rehab PT Goals Patient Stated Goal: to go home Progress towards PT goals: Progressing toward goals    Frequency    7X/week      PT Plan Current plan remains appropriate    Co-evaluation             End of Session   Activity Tolerance: Patient tolerated treatment well Patient left: with call bell/phone within reach;in bed     Time: 1240-1110 PT Time Calculation (min) (ACUTE ONLY): 1350 min  Charges:  $Gait Training: 8-22 mins $Therapeutic Exercise: 8-22 mins  G Codes:      Rada HayHill, Rosemarie Galvis [name] 04/29/2016, 1:42 PM

## 2016-04-29 NOTE — Discharge Instructions (Addendum)
° °Dr. Frank Aluisio °Total Joint Specialist °[facility] °[address] °[place], [place] [address] °[phone] ° °TOTAL KNEE REPLACEMENT POSTOPERATIVE DIRECTIONS ° °Knee Rehabilitation, Guidelines Following Surgery  °Results after knee surgery are often greatly improved when you follow the exercise, range of motion and muscle strengthening exercises prescribed by your doctor. Safety measures are also important to protect the knee from further injury. Any time any of these exercises cause you to have increased pain or swelling in your knee joint, decrease the amount until you are comfortable again and slowly increase them. If you have problems or questions, call your caregiver or physical therapist for advice.  ° °HOME CARE INSTRUCTIONS  °Remove items at home which could result in a fall. This includes throw rugs or furniture in walking pathways.  °· ICE to the affected knee every three hours for 30 minutes at a time and then as needed for pain and swelling.  Continue to use ice on the knee for pain and swelling from surgery. You may notice swelling that will progress down to the foot and ankle.  This is normal after surgery.  Elevate the leg when you are not up walking on it.   °· Continue to use the breathing machine which will help keep your temperature down.  It is common for your temperature to cycle up and down following surgery, especially at night when you are not up moving around and exerting yourself.  The breathing machine keeps your lungs expanded and your temperature down. °· Do not place pillow under knee, focus on keeping the knee straight while resting ° °DIET °You may resume your previous home diet once your are discharged from the hospital. ° °DRESSING / WOUND CARE / SHOWERING °You may shower 3 days after surgery, but keep the wounds dry during showering.  You may use an occlusive plastic wrap (Press'n Seal for example), NO SOAKING/SUBMERGING IN THE BATHTUB.  If the  bandage gets wet, change with a clean dry gauze.  If the incision gets wet, pat the wound dry with a clean towel. °You may start showering once you are discharged home but do not submerge the incision under water. Just pat the incision dry and apply a dry gauze dressing on daily. °Change the surgical dressing daily and reapply a dry dressing each time. ° °ACTIVITY °Walk with your walker as instructed. °Use walker as long as suggested by your caregivers. °Avoid periods of inactivity such as sitting longer than an hour when not asleep. This helps prevent blood clots.  °You may resume a sexual relationship in one month or when given the OK by your doctor.  °You may return to work once you are cleared by your doctor.  °Do not drive a car for 6 weeks or until released by you surgeon.  °Do not drive while taking narcotics. ° °WEIGHT BEARING °Weight bearing as tolerated with assist device (walker, cane, etc) as directed, use it as long as suggested by your surgeon or therapist, typically at least 4-6 weeks. ° °POSTOPERATIVE CONSTIPATION PROTOCOL °Constipation - defined medically as fewer than three stools per week and severe constipation as less than one stool per week. ° °One of the most common issues patients have following surgery is constipation.  Even if you have a regular bowel pattern at home, your normal regimen is likely to be disrupted due to multiple reasons following surgery.  Combination of anesthesia, postoperative narcotics, change in appetite and fluid intake all can affect your bowels.    In order to avoid complications following surgery, here are some recommendations in order to help you during your recovery period. ° °Colace (docusate) - Pick up an over-the-counter form of Colace or another stool softener and take twice a day as long as you are requiring postoperative pain medications.  Take with a full glass of water daily.  If you experience loose stools or diarrhea, hold the colace until you stool forms  back up.  If your symptoms do not get better within 1 week or if they get worse, check with your doctor. ° °Dulcolax (bisacodyl) - Pick up over-the-counter and take as directed by the product packaging as needed to assist with the movement of your bowels.  Take with a full glass of water.  Use this product as needed if not relieved by Colace only.  ° °MiraLax (polyethylene glycol) - Pick up over-the-counter to have on hand.  MiraLax is a solution that will increase the amount of water in your bowels to assist with bowel movements.  Take as directed and can mix with a glass of water, juice, soda, coffee, or tea.  Take if you go more than two days without a movement. °Do not use MiraLax more than once per day. Call your doctor if you are still constipated or irregular after using this medication for 7 days in a row. ° °If you continue to have problems with postoperative constipation, please contact the office for further assistance and recommendations.  If you experience "the worst abdominal pain ever" or develop nausea or vomiting, please contact the office immediatly for further recommendations for treatment. ° °ITCHING ° If you experience itching with your medications, try taking only a single pain pill, or even half a pain pill at a time.  You can also use Benadryl over the counter for itching or also to help with sleep.  ° °TED HOSE STOCKINGS °Wear the elastic stockings on both legs for three weeks following surgery during the day but you may remove then at night for sleeping. ° °MEDICATIONS °See your medication summary on the “After Visit Summary” that the nursing staff will review with you prior to discharge.  You may have some home medications which will be placed on hold until you complete the course of blood thinner medication.  It is important for you to complete the blood thinner medication as prescribed by your surgeon.  Continue your approved medications as instructed at time of  discharge. ° °PRECAUTIONS °If you experience chest pain or shortness of breath - call 911 immediately for transfer to the hospital emergency department.  °If you develop a fever greater that 101 F, purulent drainage from wound, increased redness or drainage from wound, foul odor from the wound/dressing, or calf pain - CONTACT YOUR SURGEON.   °                                                °FOLLOW-UP APPOINTMENTS °Make sure you keep all of your appointments after your operation with your surgeon and caregivers. You should call the office at the above phone number and make an appointment for approximately two weeks after the date of your surgery or on the date instructed by your surgeon outlined in the "After Visit Summary". ° ° °RANGE OF MOTION AND STRENGTHENING EXERCISES  °Rehabilitation of the knee is important following a knee injury or   an operation. After just a few days of immobilization, the muscles of the thigh which control the knee become weakened and shrink (atrophy). Knee exercises are designed to build up the tone and strength of the thigh muscles and to improve knee motion. Often times heat used for twenty to thirty minutes before working out will loosen up your tissues and help with improving the range of motion but do not use heat for the first two weeks following surgery. These exercises can be done on a training (exercise) mat, on the floor, on a table or on a bed. Use what ever works the best and is most comfortable for you Knee exercises include:  °Leg Lifts - While your knee is still immobilized in a splint or cast, you can do straight leg raises. Lift the leg to 60 degrees, hold for 3 sec, and slowly lower the leg. Repeat 10-20 times 2-3 times daily. Perform this exercise against resistance later as your knee gets better.  °Quad and Hamstring Sets - Tighten up the muscle on the front of the thigh (Quad) and hold for 5-10 sec. Repeat this 10-20 times hourly. Hamstring sets are done by pushing the  foot backward against an object and holding for 5-10 sec. Repeat as with quad sets.  °· Leg Slides: Lying on your back, slowly slide your foot toward your buttocks, bending your knee up off the floor (only go as far as is comfortable). Then slowly slide your foot back down until your leg is flat on the floor again. °· Angel Wings: Lying on your back spread your legs to the side as far apart as you can without causing discomfort.  °A rehabilitation program following serious knee injuries can speed recovery and prevent re-injury in the future due to weakened muscles. Contact your doctor or a physical therapist for more information on knee rehabilitation.  ° °IF YOU ARE TRANSFERRED TO A SKILLED REHAB FACILITY °If the patient is transferred to a skilled rehab facility following release from the hospital, a list of the current medications will be sent to the facility for the patient to continue.  When discharged from the skilled rehab facility, please have the facility set up the patient's Home Health Physical Therapy prior to being released. Also, the skilled facility will be responsible for providing the patient with their medications at time of release from the facility to include their pain medication, the muscle relaxants, and their blood thinner medication. If the patient is still at the rehab facility at time of the two week follow up appointment, the skilled rehab facility will also need to assist the patient in arranging follow up appointment in our office and any transportation needs. ° °MAKE SURE YOU:  °Understand these instructions.  °Get help right away if you are not doing well or get worse.  ° ° °Pick up stool softner and laxative for home use following surgery while on pain medications. °Do not submerge incision under water. °Please use good hand washing techniques while changing dressing each day. °May shower starting three days after surgery. °Please use a clean towel to pat the incision dry following  showers. °Continue to use ice for pain and swelling after surgery. °Do not use any lotions or creams on the incision until instructed by your surgeon. ° °Take Xarelto for two and a half more weeks following discharge from the hospital, then discontinue Xarelto. °Once the patient has completed the blood thinner regimen, then take a Baby 81 mg Aspirin daily for three   more weeks. ° °Information on my medicine - XARELTO® (Rivaroxaban) ° °This medication education was reviewed with me or my healthcare representative as part of my discharge preparation.  The pharmacist that spoke with me during my hospital stay was:  Batoul Limes, RPH ° °Why was Xarelto® prescribed for you? °Xarelto® was prescribed for you to reduce the risk of blood clots forming after orthopedic surgery. The medical term for these abnormal blood clots is venous thromboembolism (VTE). ° °What do you need to know about xarelto® ? °Take your Xarelto® ONCE DAILY at the same time every day. °You may take it either with or without food. ° °If you have difficulty swallowing the tablet whole, you may crush it and mix in applesauce just prior to taking your dose. ° °Take Xarelto® exactly as prescribed by your doctor and DO NOT stop taking Xarelto® without talking to the doctor who prescribed the medication.  Stopping without other VTE prevention medication to take the place of Xarelto® may increase your risk of developing a clot. ° °After discharge, you should have regular check-up appointments with your healthcare provider that is prescribing your Xarelto®.   ° °What do you do if you miss a dose? °If you miss a dose, take it as soon as you remember on the same day then continue your regularly scheduled once daily regimen the next day. Do not take two doses of Xarelto® on the same day.  ° °Important Safety Information °A possible side effect of Xarelto® is bleeding. You should call your healthcare provider right away if you experience any of the  following: °? Bleeding from an injury or your nose that does not stop. °? Unusual colored urine (red or dark brown) or unusual colored stools (red or black). °? Unusual bruising for unknown reasons. °? A serious fall or if you hit your head (even if there is no bleeding). ° °Some medicines may interact with Xarelto® and might increase your risk of bleeding while on Xarelto®. To help avoid this, consult your healthcare provider or pharmacist prior to using any new prescription or non-prescription medications, including herbals, vitamins, non-steroidal anti-inflammatory drugs (NSAIDs) and supplements. ° °This website has more information on Xarelto®: [email or web address]. ° ° ° °

## 2016-04-29 NOTE — Discharge Summary (Signed)
Physician Discharge Summary   Patient ID: [name] MRN: [id number] DOB/AGE: 74-Nov-1942 74 y.o.  Admit date: 04/28/2016 Discharge date: 04-30-2016  Primary Diagnosis:  Osteoarthritis  Left knee(s)  Admission Diagnoses:  Past Medical History:  Diagnosis Date  . Chronic hepatitis C ([place]) 2014   DID HARVONI AND RIBIVORIN [place]   . Headache    MIGRAINE AURA NO HEADACHE  . History of blood transfusion 1990   AFTER GI BLEED  . Hyperlipidemia   . Hypertension   . Multinodular goiter    SHRANK AWAY 25 YRS AGO  . Osteoarthritis    OA  . Palpitations    PACs & PVCs  . Pneumonia YRS AGO   Discharge Diagnoses:   Principal Problem:   OA (osteoarthritis) of knee  Estimated body mass index is 26.79 kg/m as calculated from the following:   Height as of this encounter: 5' 6"  (1.676 m).   Weight as of this encounter: 75.3 kg (166 lb).  Procedure:  Procedure(s) (LRB): LEFT TOTAL KNEE ARTHROPLASTY (Left)   Consults: None  HPI: [name] is a 76 y.o. year old female with end stage OA of her left knee with progressively worsening pain and dysfunction. She has constant pain, with activity and at rest and significant functional deficits with difficulties even with ADLs. She has had extensive non-op management including analgesics, injections of cortisone and viscosupplements, and home exercise program, but remains in significant pain with significant dysfunction. Radiographs show bone on bone arthritis medial and patellofemoral. She presents now for left Total Knee Arthroplasty.   Laboratory Data: Admission on 04/28/2016  Component Date Value Ref Range Status  . WBC 04/29/2016 8.7  4.0 - 10.5 K/uL Final  . RBC 04/29/2016 3.10* 3.87 - 5.11 MIL/uL Final  . Hemoglobin 04/29/2016 9.4* 12.0 - 15.0 g/dL Final  . HCT 04/29/2016 27.6* 36.0 - 46.0 % Final  . MCV 04/29/2016 89.0  78.0 - 100.0 fL Final  . MCH 04/29/2016 30.3  26.0 - 34.0 pg Final  . MCHC 04/29/2016 34.1  30.0 - 36.0 g/dL Final   . RDW 04/29/2016 13.5  11.5 - 15.5 % Final  . Platelets 04/29/2016 131* 150 - 400 K/uL Final  . Sodium 04/29/2016 139  135 - 145 mmol/L Final  . Potassium 04/29/2016 4.1  3.5 - 5.1 mmol/L Final  . Chloride 04/29/2016 107  101 - 111 mmol/L Final  . CO2 04/29/2016 27  22 - 32 mmol/L Final  . Glucose, Bld 04/29/2016 125* 65 - 99 mg/dL Final  . BUN 04/29/2016 10  6 - 20 mg/dL Final  . Creatinine, Ser 04/29/2016 0.48  0.44 - 1.00 mg/dL Final  . Calcium 04/29/2016 8.5* 8.9 - 10.3 mg/dL Final  . GFR calc non Af Amer 04/29/2016 >60  >60 mL/min Final  . GFR calc Af Amer 04/29/2016 >60  >60 mL/min Final   Comment: (NOTE) The eGFR has been calculated using the CKD EPI equation. This calculation has not been validated in all clinical situations. eGFR's persistently <60 mL/min signify possible Chronic Kidney Disease.   . Anion gap 04/29/2016 5  5 - 15 Final  . WBC 04/30/2016 6.4  4.0 - 10.5 K/uL Final  . RBC 04/30/2016 2.87* 3.87 - 5.11 MIL/uL Final  . Hemoglobin 04/30/2016 8.5* 12.0 - 15.0 g/dL Final  . HCT 04/30/2016 25.2* 36.0 - 46.0 % Final  . MCV 04/30/2016 87.8  78.0 - 100.0 fL Final  . MCH 04/30/2016 29.6  26.0 - 34.0 pg Final  .  MCHC 04/30/2016 33.7  30.0 - 36.0 g/dL Final  . RDW 04/30/2016 13.6  11.5 - 15.5 % Final  . Platelets 04/30/2016 110* 150 - 400 K/uL Final   Comment: SPECIMEN CHECKED FOR CLOTS PLATELET COUNT CONFIRMED BY SMEAR   . Sodium 04/30/2016 139  135 - 145 mmol/L Final  . Potassium 04/30/2016 3.8  3.5 - 5.1 mmol/L Final  . Chloride 04/30/2016 107  101 - 111 mmol/L Final  . CO2 04/30/2016 27  22 - 32 mmol/L Final  . Glucose, Bld 04/30/2016 118* 65 - 99 mg/dL Final  . BUN 04/30/2016 10  6 - 20 mg/dL Final  . Creatinine, Ser 04/30/2016 0.49  0.44 - 1.00 mg/dL Final  . Calcium 04/30/2016 8.5* 8.9 - 10.3 mg/dL Final  . GFR calc non Af Amer 04/30/2016 >60  >60 mL/min Final  . GFR calc Af Amer 04/30/2016 >60  >60 mL/min Final   Comment: (NOTE) The eGFR has been  calculated using the CKD EPI equation. This calculation has not been validated in all clinical situations. eGFR's persistently <60 mL/min signify possible Chronic Kidney Disease.   Georgiann Hahn gap 04/30/2016 5  5 - 15 Final  Hospital Outpatient Visit on 04/24/2016  Component Date Value Ref Range Status  . aPTT 04/24/2016 35  24 - 36 seconds Final  . WBC 04/24/2016 5.3  4.0 - 10.5 K/uL Final  . RBC 04/24/2016 4.10  3.87 - 5.11 MIL/uL Final  . Hemoglobin 04/24/2016 12.2  12.0 - 15.0 g/dL Final  . HCT 04/24/2016 36.6  36.0 - 46.0 % Final  . MCV 04/24/2016 89.3  78.0 - 100.0 fL Final  . MCH 04/24/2016 29.8  26.0 - 34.0 pg Final  . MCHC 04/24/2016 33.3  30.0 - 36.0 g/dL Final  . RDW 04/24/2016 13.5  11.5 - 15.5 % Final  . Platelets 04/24/2016 155  150 - 400 K/uL Final  . Sodium 04/24/2016 141  135 - 145 mmol/L Final  . Potassium 04/24/2016 3.6  3.5 - 5.1 mmol/L Final  . Chloride 04/24/2016 106  101 - 111 mmol/L Final  . CO2 04/24/2016 27  22 - 32 mmol/L Final  . Glucose, Bld 04/24/2016 101* 65 - 99 mg/dL Final  . BUN 04/24/2016 19  6 - 20 mg/dL Final  . Creatinine, Ser 04/24/2016 0.58  0.44 - 1.00 mg/dL Final  . Calcium 04/24/2016 9.3  8.9 - 10.3 mg/dL Final  . Total Protein 04/24/2016 6.6  6.5 - 8.1 g/dL Final  . Albumin 04/24/2016 4.1  3.5 - 5.0 g/dL Final  . AST 04/24/2016 17  15 - 41 U/L Final  . ALT 04/24/2016 15  14 - 54 U/L Final  . Alkaline Phosphatase 04/24/2016 71  38 - 126 U/L Final  . Total Bilirubin 04/24/2016 1.2  0.3 - 1.2 mg/dL Final  . GFR calc non Af Amer 04/24/2016 >60  >60 mL/min Final  . GFR calc Af Amer 04/24/2016 >60  >60 mL/min Final   Comment: (NOTE) The eGFR has been calculated using the CKD EPI equation. This calculation has not been validated in all clinical situations. eGFR's persistently <60 mL/min signify possible Chronic Kidney Disease.   . Anion gap 04/24/2016 8  5 - 15 Final  . Prothrombin Time 04/24/2016 13.0  11.4 - 15.2 seconds Final  . INR  04/24/2016 0.98   Final  . ABO/RH(D) 04/24/2016 O POS   Final  . Antibody Screen 04/24/2016 NEG   Final  . Sample Expiration 04/24/2016 05/01/2016  Final  . Extend sample reason 04/24/2016 NO TRANSFUSIONS OR PREGNANCY IN THE PAST 3 MONTHS   Final  . MRSA, PCR 04/24/2016 NEGATIVE  NEGATIVE Final  . Staphylococcus aureus 04/24/2016 NEGATIVE  NEGATIVE Final   Comment:        The Xpert SA Assay (FDA approved for NASAL specimens in patients over 1 years of age), is one component of a comprehensive surveillance program.  Test performance has been validated by [facility] for patients greater than or equal to 23 year old. It is not intended to diagnose infection nor to guide or monitor treatment.   . ABO/RH(D) 04/24/2016 O POS   Final     X-Rays:No results found.  EKG: Orders placed or performed during the hospital encounter of 04/24/16  . EKG 12 lead  . EKG 12 lead     Hospital Course: SHAKEA ISIP is a 76 y.o. who was admitted to [facility]. They were brought to the operating room on 04/28/2016 and underwent Procedure(s): LEFT TOTAL KNEE ARTHROPLASTY.  Patient tolerated the procedure well and was later transferred to the recovery room and then to the orthopaedic floor for postoperative care.  They were given PO and IV analgesics for pain control following their surgery.  They were given 24 hours of postoperative antibiotics of  Anti-infectives    Start     Dose/Rate Route Frequency Ordered Stop   04/28/16 1900  vancomycin (VANCOCIN) IVPB 1000 mg/200 mL premix     1,000 mg 200 mL/hr over 60 Minutes Intravenous Every 12 hours 04/28/16 0953 04/28/16 1923   04/28/16 0538  vancomycin (VANCOCIN) IVPB 1000 mg/200 mL premix     1,000 mg 200 mL/hr over 60 Minutes Intravenous On call to O.R. 04/28/16 9381 04/28/16 0742     and started on DVT prophylaxis in the form of Xarelto.   PT and OT were ordered for total joint protocol.  Discharge planning consulted to help with postop  disposition and equipment needs.  Patient had a decnt night on the evening of surgery.  They started to get up OOB with therapy on day one. Hemovac drain was pulled without difficulty.  Continued to work with therapy into day two.  Dressing was changed on day two and the incision was healing well. Patient was seen in rounds on day two and was ready to go home.  Discharge home - straight to outpatient Diet - Cardiac diet Follow up - in 2 weeks Activity - WBAT Disposition - Home Condition Upon Discharge - Good D/C Meds - See DC Summary DVT Prophylaxis - Xarelto   Discharge Instructions    Call MD / Call 911    Complete by:  As directed    If you experience chest pain or shortness of breath, CALL 911 and be transported to the hospital emergency room.  If you develope a fever above 101 F, pus (white drainage) or increased drainage or redness at the wound, or calf pain, call your surgeon's office.   Change dressing    Complete by:  As directed    Change dressing daily with sterile 4 x 4 inch gauze dressing and apply TED hose. Do not submerge the incision under water.   Constipation Prevention    Complete by:  As directed    Drink plenty of fluids.  Prune juice may be helpful.  You may use a stool softener, such as Colace (over the counter) 100 mg twice a day.  Use MiraLax (over the counter) for  constipation as needed.   Diet - low sodium heart healthy    Complete by:  As directed    Discharge instructions    Complete by:  As directed    Pick up stool softner and laxative for home use following surgery while on pain medications. Do not submerge incision under water. Please use good hand washing techniques while changing dressing each day. May shower starting three days after surgery. Please use a clean towel to pat the incision dry following showers. Continue to use ice for pain and swelling after surgery. Do not use any lotions or creams on the incision until instructed by your  surgeon.  Wear both TED hose on both legs during the day every day for three weeks, but may have off at night at home.  Postoperative Constipation Protocol  Constipation - defined medically as fewer than three stools per week and severe constipation as less than one stool per week.  One of the most common issues patients have following surgery is constipation.  Even if you have a regular bowel pattern at home, your normal regimen is likely to be disrupted due to multiple reasons following surgery.  Combination of anesthesia, postoperative narcotics, change in appetite and fluid intake all can affect your bowels.  In order to avoid complications following surgery, here are some recommendations in order to help you during your recovery period.  Colace (docusate) - Pick up an over-the-counter form of Colace or another stool softener and take twice a day as long as you are requiring postoperative pain medications.  Take with a full glass of water daily.  If you experience loose stools or diarrhea, hold the colace until you stool forms back up.  If your symptoms do not get better within 1 week or if they get worse, check with your doctor.  Dulcolax (bisacodyl) - Pick up over-the-counter and take as directed by the product packaging as needed to assist with the movement of your bowels.  Take with a full glass of water.  Use this product as needed if not relieved by Colace only.   MiraLax (polyethylene glycol) - Pick up over-the-counter to have on hand.  MiraLax is a solution that will increase the amount of water in your bowels to assist with bowel movements.  Take as directed and can mix with a glass of water, juice, soda, coffee, or tea.  Take if you go more than two days without a movement. Do not use MiraLax more than once per day. Call your doctor if you are still constipated or irregular after using this medication for 7 days in a row.  If you continue to have problems with postoperative  constipation, please contact the office for further assistance and recommendations.  If you experience "the worst abdominal pain ever" or develop nausea or vomiting, please contact the office immediatly for further recommendations for treatment.   Take Xarelto for two and a half more weeks, then discontinue Xarelto. Once the patient has completed the blood thinner regimen, then take a Baby 81 mg Aspirin daily for three more weeks.    Do not put a pillow under the knee. Place it under the heel.    Complete by:  As directed    Do not sit on low chairs, stoools or toilet seats, as it may be difficult to get up from low surfaces    Complete by:  As directed    Driving restrictions    Complete by:  As directed  No driving until released by the physician.   Increase activity slowly as tolerated    Complete by:  As directed    Lifting restrictions    Complete by:  As directed    No lifting until released by the physician.   Patient may shower    Complete by:  As directed    You may shower without a dressing once there is no drainage.  Do not wash over the wound.  If drainage remains, do not shower until drainage stops.   TED hose    Complete by:  As directed    Use stockings (TED hose) for 3 weeks on both leg(s).  You may remove them at night for sleeping.   Weight bearing as tolerated    Complete by:  As directed    Laterality:  left   Extremity:  Lower     Allergies as of 04/30/2016      Reactions   Meperidine Hcl Other (See Comments)   URTICARIA    Nitrofurantoin Other (See Comments)   FLU Like symptoms    Griseofulvin Rash   Penicillins Rash   30 years ago and childhood      Medication List    STOP taking these medications   cholecalciferol 1000 units tablet Commonly known as:  VITAMIN D     TAKE these medications   albuterol 108 (90 Base) MCG/ACT inhaler Commonly known as:  PROVENTIL HFA;VENTOLIN HFA Inhale 1-2 puffs into the lungs every 4 (four) hours as needed for  wheezing or shortness of breath.   amLODipine 2.5 MG tablet Commonly known as:  NORVASC Take 2.5 mg by mouth daily.   iron polysaccharides 150 MG capsule Commonly known as:  NIFEREX Take 1 capsule (150 mg total) by mouth 2 (two) times daily.   levothyroxine 50 MCG tablet Commonly known as:  SYNTHROID, LEVOTHROID Take 50 mcg by mouth daily before breakfast.   methocarbamol 500 MG tablet Commonly known as:  ROBAXIN Take 1 tablet (500 mg total) by mouth every 6 (six) hours as needed for muscle spasms.   metoprolol succinate 50 MG 24 hr tablet Commonly known as:  TOPROL-XL Take 50 mg by mouth daily.   oxyCODONE 5 MG immediate release tablet Commonly known as:  Oxy IR/ROXICODONE Take 1-2 tablets (5-10 mg total) by mouth every 4 (four) hours as needed for moderate pain or severe pain.   pravastatin 20 MG tablet Commonly known as:  PRAVACHOL Take 20 mg by mouth at bedtime.   rivaroxaban 10 MG Tabs tablet Commonly known as:  XARELTO Take 1 tablet (10 mg total) by mouth daily with breakfast. Take Xarelto for two and a half more weeks following discharge from the hospital, then discontinue Xarelto. Once the patient has completed the blood thinner regimen, then take a Baby 81 mg Aspirin daily for three more weeks.   traMADol 50 MG tablet Commonly known as:  ULTRAM Take 1-2 tablets (50-100 mg total) by mouth every 6 (six) hours as needed (mild pain).            Durable Medical Equipment        Start     Ordered   04/29/16 1032  For home use only DME Walker rolling  Once    Question:  Patient needs a walker to treat with the following condition  Answer:  OA (osteoarthritis) of knee   04/29/16 1032   04/29/16 1032  For home use only DME 3 n 1  Once  04/29/16 1032     Follow-up Information    Gearlean Alf, MD. Schedule an appointment as soon as possible for a visit on 05/13/2016.   Specialty:  Orthopedic Surgery Contact information: [address] [place] [address] [phone]        [facility] Follow up.   Specialty:  Home Health Services Why:  home health physical therapy Contact information: [address] [place]  [address] [phone]        [facility] Follow up.   Why:  rolling walker and 3n1 (over the commode seat) Contact information: [place] [address] [phone]           Signed: Arlee Muslim, PA-C Orthopaedic Surgery 04/30/2016, 7:58 AM

## 2016-04-29 NOTE — Progress Notes (Signed)
Physical Therapy Treatment Patient Details Name: Pennie RushingBetty R Aleshire MRN: [id number] DOB: 10/02/1940 Today's Date: 04/29/2016    History of Present Illness L tka    PT Comments     Just ambulated to the BR. Reviewed exercises. Plans DC tomorrow,   Follow Up Recommendations  Home health PT (may have OPPT per Dr. Mervyn SkeetersA.)     Equipment Recommendations  Rolling walker with 5" wheels    Recommendations for Other Services       Precautions / Restrictions Precautions Precautions: Knee Precaution Comments: did not wear KI for ambulation Required Braces or Orthoses: Knee Immobilizer - Left Knee Immobilizer - Left: Discontinue once straight leg raise with < 10 degree lag    Mobility  Bed Mobility   Transfers          Ambulation/Gait    Stairs            Wheelchair Mobility    Modified Rankin (Stroke Patients Only)       Balance                                    Cognition Arousal/Alertness: Awake/alert                          Exercises Total Joint Exercises Ankle Circles/Pumps: AROM;Both;10 reps Quad Sets: AROM;Both;10 reps Short Arc Quad: AROM;Left;10 reps Heel Slides: AROM;Left;10 reps Hip ABduction/ADduction: AROM;Left;10 reps Straight Leg Raises: AROM;Left;10 reps Goniometric ROM: 10-90 left    General Comments        Pertinent Vitals/Pain Pain Assessment: 0-10 Pain Score: 4  Pain Location: lt knee Pain Descriptors / Indicators: Sore;Aching Pain Intervention(s): Premedicated before session;Repositioned    Home Living                      Prior Function            PT Goals (current goals can now be found in the care plan section) Progress towards PT goals: Progressing toward goals    Frequency    7X/week      PT Plan Current plan remains appropriate    Co-evaluation             End of Session   Activity Tolerance: Patient tolerated treatment well Patient left: in bed;with call  bell/phone within reach     Time: 1456-1516 PT Time Calculation (min) (ACUTE ONLY): 20 min  Charges:  $Gait Training: 8-22 mins $Therapeutic Exercise: 8-22 mins                    G Codes:      Rada HayHill, Lynsay Fesperman [name] 04/29/2016, 4:56 PM

## 2016-04-29 NOTE — Care Management Note (Signed)
Case Management Note  Patient Details  Name: [name] MRN: [id number] Date of Birth: 1940/03/29  Subjective/Objective:                  LEFT TOTAL KNEE ARTHROPLASTY (Left) Action/Plan: Discharge planning Expected Discharge Date:  04/29/16               Expected Discharge Plan:  [place]  In-House Referral:     Discharge planning Services  CM Consult  Post Acute Care Choice:  Home Health Choice offered to:  Patient  DME Arranged:  3-N-1, Walker rolling DME Agency:  [place]:  PT [place] Agency:  [facility] (formerly [facility])  Status of Service:  Completed, signed off  If discussed at [employer], dates discussed:    Additional Comments: Cm met with pt in room to offer choice of home health agency. Pt chooses [facility] to render HHPT.  Referral called to [facility] rep, Tim. CM notified [place] DME rep, Joelene Millin to please deliver the rolling walker to room prior to discharge. NO other CM needs were communicated. Dellie Catholic, RN 04/29/2016, 10:46 AM

## 2016-04-29 NOTE — Evaluation (Signed)
Occupational Therapy Evaluation Patient Details Name: Pennie RushingBetty R Kirchgessner MRN: [id number] DOB: 04/08/1940 Today's Date: 04/29/2016    History of Present Illness  s/p TKR   Clinical Impression   OT education complete regarding ADL activity s/p TKR    Follow Up Recommendations  No OT follow up    Equipment Recommendations  3 in 1 bedside commode    Recommendations for Other Services       Precautions / Restrictions Precautions Precautions: Knee Required Braces or Orthoses: Knee Immobilizer - Left Knee Immobilizer - Left: Discontinue once straight leg raise with < 10 degree lag Restrictions Weight Bearing Restrictions: No      Mobility Bed Mobility Overal bed mobility: Needs Assistance Bed Mobility: Supine to Sit     Supine to sit: Supervision        Transfers Overall transfer level: Needs assistance Equipment used: Rolling walker (2 wheeled) Transfers: Sit to/from [employer] CorporationStand;Stand Pivot Transfers Sit to Stand: Supervision Stand pivot transfers: Supervision       General transfer comment: cues for hand and  left leg         ADL Overall ADL's : Needs assistance/impaired                 Upper Body Dressing : Supervision/safety;Sitting   Lower Body Dressing: Minimal assistance;Sit to/from stand;Cueing for safety;Cueing for sequencing   Toilet Transfer: Supervision/safety;RW;Ambulation StatisticianToilet Transfer Details (indicate cue type and reason): bed to chair Toileting- Clothing Manipulation and Hygiene: Supervision/safety;Sit to/from stand;Cueing for safety;Cueing for compensatory techniques       Functional mobility during ADLs: Supervision/safety;Caregiver able to provide necessary level of assistance;Cueing for safety;Rolling walker General ADL Comments: husband will a as needed               Pertinent Vitals/Pain Pain Score: 2  Pain Location: r knee Pain Descriptors / Indicators: Sore Pain Intervention(s): Monitored during session;Repositioned         Extremity/Trunk Assessment Upper Extremity Assessment Upper Extremity Assessment: Overall WFL for tasks assessed           Communication     Cognition Arousal/Alertness: Awake/alert Behavior During Therapy: WFL for tasks assessed/performed Overall Cognitive Status: Within Functional Limits for tasks assessed                                Home Living Family/patient expects to be discharged to:: Private residence Living Arrangements: Spouse/significant other Available Help at Discharge: Family   Home Access: Stairs to enter Secretary/administratorntrance Stairs-Number of Steps: 2 Entrance Stairs-Rails: None Home Layout: One level               Home Equipment: None          Prior Functioning/Environment Level of Independence: Independent                       OT Goals(Current goals can be found in the care plan section) Acute Rehab OT Goals Patient Stated Goal: to go home  OT Frequency:                End of Session Equipment Utilized During Treatment: Rolling walker CPM Left Knee CPM Left Knee: Off  Activity Tolerance: Patient tolerated treatment well Patient left: in bed   Time: 0952-1020 OT Time Calculation (min): 28 min Charges:  OT General Charges $OT Visit: 1 Procedure OT Evaluation $OT Eval Low Complexity: 1 Procedure OT Treatments $Self Care/Home Management :  8-22 mins G-Codes:    Alba Cory 10-May-2016, 10:43 AM

## 2016-04-29 NOTE — Progress Notes (Signed)
   Subjective: 1 Day Post-Op Procedure(s) (LRB): LEFT TOTAL KNEE ARTHROPLASTY (Left) Patient reports pain as mild.   Patient seen in rounds with Dr. Lequita HaltAluisio. Patient is well, but has had some minor complaints of pain in the knee, requiring pain medications We will start therapy today.  If they do well with therapy and meets all goals, then will allow home later this afternoon following therapy. Plan is to go Home after hospital stay.  Objective: Vital signs in last 24 hours: Temp:  [97 F (36.1 C)-97.7 F (36.5 C)] 97.7 F (36.5 C) (02/06 0641) Pulse Rate:  [54-68] 55 (02/06 0641) Resp:  [12-18] 16 (02/06 0641) BP: (97-147)/(54-82) 97/54 (02/06 0641) SpO2:  [97 %-100 %] 98 % (02/06 0641)  Intake/Output from previous day:  Intake/Output Summary (Last 24 hours) at 04/29/16 0844 Last data filed at 04/29/16 0839  Gross per 24 hour  Intake          3601.25 ml  Output             3660 ml  Net           -58.75 ml    Intake/Output this shift: Total I/O In: 240 [P.O.:240] Out: -   Labs:  Recent Labs  04/29/16 0431  HGB 9.4*    Recent Labs  04/29/16 0431  WBC 8.7  RBC 3.10*  HCT 27.6*  PLT 131*    Recent Labs  04/29/16 0431  NA 139  K 4.1  CL 107  CO2 27  BUN 10  CREATININE 0.48  GLUCOSE 125*  CALCIUM 8.5*   No results for input(s): LABPT, INR in the last 72 hours.  EXAM General - Patient is Alert and Appropriate Extremity - Neurovascular intact Sensation intact distally Dressing - dressing C/D/I Motor Function - intact, moving foot and toes well on exam.  Hemovac pulled without difficulty.  Past Medical History:  Diagnosis Date  . Chronic hepatitis C (HCC) 2014   DID HARVONI AND RIBIVORIN [place]   . Headache    MIGRAINE AURA NO HEADACHE  . History of blood transfusion 1990   AFTER GI BLEED  . Hyperlipidemia   . Hypertension   . Multinodular goiter    SHRANK AWAY 25 YRS AGO  . Osteoarthritis    OA  . Palpitations    PACs & PVCs  . Pneumonia  YRS AGO    Assessment/Plan: 1 Day Post-Op Procedure(s) (LRB): LEFT TOTAL KNEE ARTHROPLASTY (Left) Principal Problem:   OA (osteoarthritis) of knee  Estimated body mass index is 26.79 kg/m as calculated from the following:   Height as of this encounter: 5\' 6"  (1.676 m).   Weight as of this encounter: 75.3 kg (166 lb). Up with therapy  DVT Prophylaxis - Xarelto Weight-Bearing as tolerated to left leg D/C O2 and Pulse OX and try on Room Air  If meets goals and able to go home: Up with therapy Diet - Cardiac diet Follow up - in 2 weeks Activity - WBAT Disposition - Home Condition Upon Discharge - pending D/C Meds - See DC Summary DVT Prophylaxis - Xarelto  Doris Peacerew Giovanna Kemmerer, PA-C Orthopaedic Surgery

## 2016-04-30 LAB — BASIC METABOLIC PANEL
Anion gap: 5 (ref 5–15)
BUN: 10 mg/dL (ref 6–20)
CO2: 27 mmol/L (ref 22–32)
CREATININE: 0.49 mg/dL (ref 0.44–1.00)
Calcium: 8.5 mg/dL — ABNORMAL LOW (ref 8.9–10.3)
Chloride: 107 mmol/L (ref 101–111)
GFR calc Af Amer: >60 mL/min (ref >60)
GLUCOSE: 118 mg/dL — AB (ref 65–99)
Potassium: 3.8 mmol/L (ref 3.5–5.1)
SODIUM: 139 mmol/L (ref 135–145)

## 2016-04-30 LAB — CBC
HCT: 25.2 % — ABNORMAL LOW (ref 36.0–46.0)
Hemoglobin: 8.5 g/dL — ABNORMAL LOW (ref 12.0–15.0)
MCH: 29.6 pg (ref 26.0–34.0)
MCHC: 33.7 g/dL (ref 30.0–36.0)
MCV: 87.8 fL (ref 78.0–100.0)
PLATELETS: 110 10*3/uL — AB (ref 150–400)
RBC: 2.87 MIL/uL — ABNORMAL LOW (ref 3.87–5.11)
RDW: 13.6 % (ref 11.5–15.5)
WBC: 6.4 10*3/uL (ref 4.0–10.5)

## 2016-04-30 MED ORDER — POLYSACCHARIDE IRON COMPLEX 150 MG PO CAPS
150.0000 mg | ORAL_CAPSULE | Freq: Two times a day (BID) | ORAL | Status: DC
  Administered 2016-04-30: 10:00:00 150 mg via ORAL
  Filled 2016-04-30: qty 1

## 2016-04-30 MED ORDER — POLYSACCHARIDE IRON COMPLEX 150 MG PO CAPS: 150.0000 mg | ORAL_CAPSULE | Freq: Two times a day (BID) | ORAL | 0 refills | Status: DC

## 2016-04-30 NOTE — Progress Notes (Signed)
Physical Therapy Treatment Patient Details Name: Doris RushingBetty R Zimmerman MRN: [id number] DOB: 10/22/1940 Today's Date: 04/30/2016    History of Present Illness L tka    PT Comments    Pt progressing well with mobility.  Reviewed stairs in multiple ways with pt and spouse with written instructions provided.  Follow Up Recommendations  Outpatient PT     Equipment Recommendations  Rolling walker with 5" wheels    Recommendations for Other Services       Precautions / Restrictions Precautions Precautions: Knee Required Braces or Orthoses: Knee Immobilizer - Left Knee Immobilizer - Left: Discontinue once straight leg raise with < 10 degree lag Restrictions Weight Bearing Restrictions: No Other Position/Activity Restrictions: WBAT    Mobility  Bed Mobility Overal bed mobility: Needs Assistance Bed Mobility: Supine to Sit     Supine to sit: Supervision     General bed mobility comments: min cues for technique  Transfers Overall transfer level: Needs assistance Equipment used: Rolling walker (2 wheeled) Transfers: Sit to/from Stand Sit to Stand: Supervision         General transfer comment: cues for hand and  left leg  Ambulation/Gait Ambulation/Gait assistance: Min guard;Supervision Ambulation Distance (Feet): 111 Feet Assistive device: Rolling walker (2 wheeled) Gait Pattern/deviations: Step-to pattern;Step-through pattern     General Gait Details: cues for sequence   Stairs Stairs: Yes   Stair Management: No rails;One rail Right;Step to pattern;Forwards;Backwards;With walker;With crutches Number of Stairs: 16 General stair comments: single step fwd and bkwd with RW, 3 stairs twice bkwd with RW and spouse assisting; and 8 stairs with crutch and rail and spouse assisting.  Cues for sequence and foot/crutch/RW placement.  Written instructions provided.    Wheelchair Mobility    Modified Rankin (Stroke Patients Only)       Balance                                    Cognition Arousal/Alertness: Awake/alert Behavior During Therapy: WFL for tasks assessed/performed Overall Cognitive Status: Within Functional Limits for tasks assessed                      Exercises Total Joint Exercises Ankle Circles/Pumps: AROM;Both;20 reps;Supine Quad Sets: AROM;Both;15 reps;Supine Heel Slides: AROM;Left;20 reps;Supine Straight Leg Raises: AROM;Left;20 reps;Supine Long Arc Quad: AROM;Left;10 reps;Seated Goniometric ROM: AAROM at L knee -8 - 90    General Comments        Pertinent Vitals/Pain Pain Assessment: 0-10 Pain Score: 4  Pain Location: lt knee Pain Descriptors / Indicators: Sore;Aching Pain Intervention(s): Limited activity within patient's tolerance;Monitored during session;Premedicated before session;Ice applied    Home Living                      Prior Function            PT Goals (current goals can now be found in the care plan section) Acute Rehab PT Goals Patient Stated Goal: to go home PT Goal Formulation: With patient/family Time For Goal Achievement: 05/01/16 Potential to Achieve Goals: Good Progress towards PT goals: Progressing toward goals    Frequency    7X/week      PT Plan Current plan remains appropriate    Co-evaluation             End of Session Equipment Utilized During Treatment: Gait belt Activity Tolerance: Patient tolerated treatment well Patient left: in chair;with  family/visitor present     Time: 1610-9604 PT Time Calculation (min) (ACUTE ONLY): 25 min  Charges:  $Gait Training: 23-37 mins $Therapeutic Exercise: 8-22 mins $Therapeutic Activity: 8-22 mins                    G Codes:      Brynnlee Cumpian 05/26/16, 11:13 AM

## 2016-04-30 NOTE — Progress Notes (Signed)
Physical Therapy Treatment Patient Details Name: Pennie RushingBetty R Schwartz MRN: [id number] DOB: 03/19/1941 Today's Date: 04/30/2016    History of Present Illness L tka    PT Comments    Pt progressing well.  Reviewed home therex with written instructions provided.  Follow Up Recommendations  Outpatient PT     Equipment Recommendations  Rolling walker with 5" wheels    Recommendations for Other Services       Precautions / Restrictions Precautions Precautions: Knee Required Braces or Orthoses: Knee Immobilizer - Left Knee Immobilizer - Left: Discontinue once straight leg raise with < 10 degree lag (Pt performed IND SLR this am) Restrictions Weight Bearing Restrictions: No Other Position/Activity Restrictions: WBAT    Mobility  Bed Mobility Overal bed mobility: Needs Assistance Bed Mobility: Supine to Sit     Supine to sit: Supervision     General bed mobility comments: min cues for technique  Transfers Overall transfer level: Needs assistance Equipment used: Rolling walker (2 wheeled) Transfers: Sit to/from Stand Sit to Stand: Supervision         General transfer comment: cues for hand and  left leg  Ambulation/Gait Ambulation/Gait assistance: Min guard Ambulation Distance (Feet): 15 Feet Assistive device: Rolling walker (2 wheeled) Gait Pattern/deviations: Step-to pattern;Step-through pattern     General Gait Details: cues for sequence   Stairs            Wheelchair Mobility    Modified Rankin (Stroke Patients Only)       Balance                                    Cognition Arousal/Alertness: Awake/alert Behavior During Therapy: WFL for tasks assessed/performed Overall Cognitive Status: Within Functional Limits for tasks assessed                      Exercises Total Joint Exercises Ankle Circles/Pumps: AROM;Both;20 reps;Supine Quad Sets: AROM;Both;15 reps;Supine Heel Slides: AROM;Left;20 reps;Supine Straight Leg  Raises: AROM;Left;20 reps;Supine Long Arc Quad: AROM;Left;10 reps;Seated Goniometric ROM: AAROM at L knee -8 - 90    General Comments        Pertinent Vitals/Pain Pain Assessment: 0-10 Pain Score: 5  Pain Location: lt knee Pain Descriptors / Indicators: Sore;Aching Pain Intervention(s): Limited activity within patient's tolerance;Monitored during session;Premedicated before session;Ice applied    Home Living                      Prior Function            PT Goals (current goals can now be found in the care plan section) Acute Rehab PT Goals Patient Stated Goal: to go home PT Goal Formulation: With patient/family Time For Goal Achievement: 05/01/16 Potential to Achieve Goals: Good Progress towards PT goals: Progressing toward goals    Frequency    7X/week      PT Plan Discharge plan needs to be updated    Co-evaluation             End of Session Equipment Utilized During Treatment: Gait belt Activity Tolerance: Patient tolerated treatment well Patient left: Other (comment) (bathroom)     Time: [id number] PT Time Calculation (min) (ACUTE ONLY): 25 min  Charges:                       G Codes:      Jemarion Roycroft 04/30/2016, 10:36  AM

## 2016-04-30 NOTE — Progress Notes (Signed)
   Subjective: 2 Days Post-Op Procedure(s) (LRB): LEFT TOTAL KNEE ARTHROPLASTY (Left) Patient reports pain as mild.   Patient seen in rounds with Dr. Lequita HaltAluisio. Patient is well, and has had no acute complaints or problems Patient is ready to go home  Objective: Vital signs in last 24 hours: Temp:  [97.8 F (36.6 C)-98.6 F (37 C)] 98.5 F (36.9 C) (02/07 0512) Pulse Rate:  [56-69] 56 (02/07 0636) Resp:  [16] 16 (02/07 0512) BP: (93-126)/(51-78) 123/57 (02/07 0636) SpO2:  [97 %-99 %] 99 % (02/07 0512)  Intake/Output from previous day:  Intake/Output Summary (Last 24 hours) at 04/30/16 0740 Last data filed at 04/30/16 0512  Gross per 24 hour  Intake             1500 ml  Output             1550 ml  Net              -50 ml    Intake/Output this shift: No intake/output data recorded.  Labs:  Recent Labs  04/29/16 0431 04/30/16 0435  HGB 9.4* 8.5*    Recent Labs  04/29/16 0431 04/30/16 0435  WBC 8.7 6.4  RBC 3.10* 2.87*  HCT 27.6* 25.2*  PLT 131* 110*    Recent Labs  04/29/16 0431 04/30/16 0435  NA 139 139  K 4.1 3.8  CL 107 107  CO2 27 27  BUN 10 10  CREATININE 0.48 0.49  GLUCOSE 125* 118*  CALCIUM 8.5* 8.5*   No results for input(s): LABPT, INR in the last 72 hours.  EXAM: General - Patient is Alert and Appropriate Extremity - Neurovascular intact Sensation intact distally Incision - clean, dry, no drainage, Motor Function - intact, moving foot and toes well on exam.   Assessment/Plan: 2 Days Post-Op Procedure(s) (LRB): LEFT TOTAL KNEE ARTHROPLASTY (Left) Procedure(s) (LRB): LEFT TOTAL KNEE ARTHROPLASTY (Left) Past Medical History:  Diagnosis Date  . Chronic hepatitis C (HCC) 2014   DID HARVONI AND RIBIVORIN [place]   . Headache    MIGRAINE AURA NO HEADACHE  . History of blood transfusion 1990   AFTER GI BLEED  . Hyperlipidemia   . Hypertension   . Multinodular goiter    SHRANK AWAY 25 YRS AGO  . Osteoarthritis    OA  . Palpitations    PACs & PVCs  . Pneumonia YRS AGO   Principal Problem:   OA (osteoarthritis) of knee  Estimated body mass index is 26.79 kg/m as calculated from the following:   Height as of this encounter: 5\' 6"  (1.676 m).   Weight as of this encounter: 75.3 kg (166 lb).   Discharge home - straight to outpatient Diet - Cardiac diet Follow up - in 2 weeks Activity - WBAT Disposition - Home Condition Upon Discharge - Good D/C Meds - See DC Summary DVT Prophylaxis - Xarelto  [name] Doris Hargett, PA-C Orthopaedic Surgery 04/30/2016, 7:40 AM

## 2016-11-13 ENCOUNTER — Encounter (HOSPITAL_COMMUNITY): Payer: Self-pay | Admitting: Orthopedic Surgery

## 2016-11-13 NOTE — Addendum Note (Signed)
Addendum  created 11/13/16 1037 by Ozzie Knobel, MD   Sign clinical note    

## 2016-12-01 ENCOUNTER — Ambulatory Visit (INDEPENDENT_AMBULATORY_CARE_PROVIDER_SITE_OTHER): Payer: Medicare Other | Admitting: Sports Medicine

## 2016-12-01 VITALS — BP 124/78 | Ht 66.0 in | Wt 167.0 lb

## 2016-12-01 DIAGNOSIS — S161XXA Strain of muscle, fascia and tendon at neck level, initial encounter: Secondary | ICD-10-CM | POA: Diagnosis not present

## 2016-12-01 NOTE — Patient Instructions (Addendum)
It was great to see you today for your office visit.  I am recommending you perform the neck exercises that I taught you two times daily for the next 2 weeks. Please also utilize heat and NSAIDs as needed for when your neck is painful. If at any point you develop weakness, numbness, or tingling, please call and schedule an appointment.  If you symptoms are not improving, we will see you in 2-4 weeks for follow up and consideration of further therapy including manipulation or imaging if indicated. Cervical Strain and Sprain Rehab Ask your health care provider which exercises are safe for you. Do exercises exactly as told by your health care provider and adjust them as directed. It is normal to feel mild stretching, pulling, tightness, or discomfort as you do these exercises, but you should stop right away if you feel sudden pain or your pain gets worse.Do not begin these exercises until told by your health care provider. Stretching and range of motion exercises These exercises warm up your muscles and joints and improve the movement and flexibility of your neck. These exercises also help to relieve pain, numbness, and tingling. Exercise A: Cervical side bend  1. Using good posture, sit on a stable chair or stand up. 2. Without moving your shoulders, slowly tilt your left / right ear to your shoulder until you feel a stretch in your neck muscles. You should be looking straight ahead. 3. Hold for __________ seconds. 4. Repeat with the other side of your neck. Repeat __________ times. Complete this exercise __________ times a day. Exercise B: Cervical rotation  1. Using good posture, sit on a stable chair or stand up. 2. Slowly turn your head to the side as if you are looking over your left / right shoulder. ? Keep your eyes level with the ground. ? Stop when you feel a stretch along the side and the back of your neck. 3. Hold for __________ seconds. 4. Repeat this by turning to your other  side. Repeat __________ times. Complete this exercise __________ times a day. Exercise C: Thoracic extension and pectoral stretch 1. Roll a towel or a small blanket so it is about 4 inches (10 cm) in diameter. 2. Lie down on your back on a firm surface. 3. Put the towel lengthwise, under your spine in the middle of your back. It should not be not under your shoulder blades. The towel should line up with your spine from your middle back to your lower back. 4. Put your hands behind your head and let your elbows fall out to your sides. 5. Hold for __________ seconds. Repeat __________ times. Complete this exercise __________ times a day. Strengthening exercises These exercises build strength and endurance in your neck. Endurance is the ability to use your muscles for a long time, even after your muscles get tired. Exercise D: Upper cervical flexion, isometric 1. Lie on your back with a thin pillow behind your head and a small rolled-up towel under your neck. 2. Gently tuck your chin toward your chest and nod your head down to look toward your feet. Do not lift your head off the pillow. 3. Hold for __________ seconds. 4. Release the tension slowly. Relax your neck muscles completely before you repeat this exercise. Repeat __________ times. Complete this exercise __________ times a day. Exercise E: Cervical extension, isometric  1. Stand about 6 inches (15 cm) away from a wall, with your back facing the wall. 2. Place a soft object, about 6-8  inches (15-20 cm) in diameter, between the back of your head and the wall. A soft object could be a small pillow, a ball, or a folded towel. 3. Gently tilt your head back and press into the soft object. Keep your jaw and forehead relaxed. 4. Hold for __________ seconds. 5. Release the tension slowly. Relax your neck muscles completely before you repeat this exercise. Repeat __________ times. Complete this exercise __________ times a day. Posture and body  mechanics  Body mechanics refers to the movements and positions of your body while you do your daily activities. Posture is part of body mechanics. Good posture and healthy body mechanics can help to relieve stress in your body's tissues and joints. Good posture means that your spine is in its natural S-curve position (your spine is neutral), your shoulders are pulled back slightly, and your head is not tipped forward. The following are general guidelines for applying improved posture and body mechanics to your everyday activities. Standing  When standing, keep your spine neutral and keep your feet about hip-width apart. Keep a slight bend in your knees. Your ears, shoulders, and hips should line up.  When you do a task in which you stand in one place for a long time, place one foot up on a stable object that is 2-4 inches (5-10 cm) high, such as a footstool. This helps keep your spine neutral. Sitting   When sitting, keep your spine neutral and your keep feet flat on the floor. Use a footrest, if necessary, and keep your thighs parallel to the floor. Avoid rounding your shoulders, and avoid tilting your head forward.  When working at a desk or a computer, keep your desk at a height where your hands are slightly lower than your elbows. Slide your chair under your desk so you are close enough to maintain good posture.  When working at a computer, place your monitor at a height where you are looking straight ahead and you do not have to tilt your head forward or downward to look at the screen. Resting When lying down and resting, avoid positions that are most painful for you. Try to support your neck in a neutral position. You can use a contour pillow or a small rolled-up towel. Your pillow should support your neck but not push on it. This information is not intended to replace advice given to you by your health care provider. Make sure you discuss any questions you have with your health care  provider. Document Released: 03/10/2005 Document Revised: 11/15/2015 Document Reviewed: 02/14/2015 Elsevier Interactive Patient Education  [employer]2018 [employer]

## 2016-12-01 NOTE — Progress Notes (Signed)
   Subjective:    Patient ID: [name], female    DOB: 09/16/1940, 76 y.o.   MRN: [id number]  HPI 75yo Female w/ pmh significant for spondylosis and foraminal stenosis C7-T1 on left who presents today with a 3 month history of L sided neck pain and stiffness. Symptoms began shortly after her L TKA back in February 2018 and have persisted since. She notes symptoms are primarily left sided and non radicular in nature. She describes pain as intermittent, dull, and a "misery". Pain is worse while sitting at the computer or when walking her dogs with her bag around her right shoulder. She has tried Motrin and activity modification switching to a fanny pack from a bag with minimal relief. Denies any numbness, tingling, or radicular pain from neck. Denies any weakness of the L shoulder or decreased grip strength. States her symptoms are not like the symptoms she had when she required epidural steroid injections approximately 8 years ago. Denies any fevers or chills.   Review of Systems  Constitutional: Negative for chills and fever.  Musculoskeletal: Positive for neck pain and neck stiffness. Negative for back pain and gait problem.  Neurological: Negative for weakness, numbness and headaches.       Objective:   Physical Exam  Constitutional: She is oriented to person, place, and time. She appears well-developed and well-nourished. No distress.  HENT:  Head: Normocephalic and atraumatic.  Neck: Neck supple.  Mild head tilt to the L upon inspection. No midline tenderness to palpation. SCM bogginess on L>R and bogginess in Superior belly of Trapezius. Decreased ROM in right rotation of cervical spine and right sidebending. Negative Spurlings test b/l.  Cardiovascular: Intact distal pulses.   Pulmonary/Chest: Effort normal.  Musculoskeletal:  LUE: no abnormality upon inspection. No tenderness to palpation over Wingate/AC region. No step off deformity. Full active and passive ROM in forward Flexion,  Extension, Abduction, Adduction. Sensation intact to light touch C5-T1. Axillary sensation intact. Radial pulse +2/4 bilaterally. Reflexes: C5 2/4, C6 2/4, C7 2/4 bilaterally  Neurological: She is alert and oriented to person, place, and time. She displays normal reflexes. She exhibits normal muscle tone.  Skin: Skin is warm. No rash noted. No erythema.          Assessment & Plan:  Left SCM muscle strain  Recommend performing cervical muscle stretching exercises 2x daily for the next 4 weeks. Demonstrated these stretches in the office and provided education handout on this for reference at home. Recommend NSAIDs and heat over the area as well. If at any point the patient develops numbness, tingling, or weakness, then should be seen by a provider. If not improving, recommend reevaluation for consideration of further therapy including: manipulation or possible imaging if indicated. No current indications for imaging at this time. Patient agreeable to current plan.

## 2016-12-08 ENCOUNTER — Ambulatory Visit: Payer: Medicare Other | Admitting: Sports Medicine

## 2017-01-05 ENCOUNTER — Ambulatory Visit: Payer: Medicare Other | Admitting: Sports Medicine

## 2017-09-15 ENCOUNTER — Telehealth: Payer: Self-pay | Admitting: Internal Medicine

## 2017-09-15 NOTE — Telephone Encounter (Signed)
Veryl Speakalone, Gregory D, FNP  Peace, Tammy L        Hola!   Hope you are well. Just wanted to give you a heads up that Dr. Okey Duprerawford approved a patient of one of docs over here Doreene AdasBetty Bhat. She will be calling to make an appointment and I told them to ask for you.    Patient has been scheduled for 6/27

## 2017-09-15 NOTE — Telephone Encounter (Signed)
noted 

## 2017-09-17 ENCOUNTER — Encounter: Payer: Self-pay | Admitting: Internal Medicine

## 2017-09-17 ENCOUNTER — Ambulatory Visit: Payer: Medicare Other | Admitting: Internal Medicine

## 2017-09-17 VITALS — BP 110/78 | HR 54 | Temp 97.7°F | Ht 66.0 in | Wt 171.0 lb

## 2017-09-17 DIAGNOSIS — E042 Nontoxic multinodular goiter: Secondary | ICD-10-CM

## 2017-09-17 DIAGNOSIS — Z8619 Personal history of other infectious and parasitic diseases: Secondary | ICD-10-CM

## 2017-09-17 DIAGNOSIS — E782 Mixed hyperlipidemia: Secondary | ICD-10-CM

## 2017-09-17 DIAGNOSIS — N390 Urinary tract infection, site not specified: Secondary | ICD-10-CM | POA: Diagnosis not present

## 2017-09-17 MED ORDER — ESTROGENS, CONJUGATED 0.625 MG/GM VA CREA: 2.0000 g | TOPICAL_CREAM | Freq: Every day | VAGINAL | 12 refills | Status: DC

## 2017-09-17 NOTE — Patient Instructions (Signed)
We have sent in a cream to use once daily for 2 weeks then 2-3 times per week to help decrease the urinary tract infections.   We will not check labs today and you can contact us via phone or mychart when you need refills.

## 2017-09-17 NOTE — Progress Notes (Signed)
   Subjective:    Patient ID: Doris RushingBetty R Fitton, female    DOB: 12/23/1940, 77 y.o.   MRN: [id number]  HPI The patient is a 77 YO female coming in new for continuation of medical care including thyroid (low levels in the past, taking synthroid 50 mcg daily, tried to get off several times without success, denies heat or cold intolerance, denies weight change), and her essential hypertension (taking amlodipine 2.5 mg daily and metoprolol 50 mg daily, good BP control, denies chest pains or headaches), and new problem of utis (4 in the last 6 months, has not seen urology, no triggers, they have all cleared with antibiotics), and cholesterol (taking pravastatin 20 mg daily, denies side effects, no symptoms of chest pains or stroke symptoms). Is up to date on health maintenance from past PCP. Hx hep c s/p treatment.   [facility] reviewed and updated.   Review of Systems  Constitutional: Negative.   HENT: Negative.   Eyes: Negative.   Respiratory: Negative for cough, chest tightness and shortness of breath.   Cardiovascular: Negative for chest pain, palpitations and leg swelling.  Gastrointestinal: Negative for abdominal distention, abdominal pain, constipation, diarrhea, nausea and vomiting.  Musculoskeletal: Negative.   Skin: Negative.   Neurological: Negative.   Psychiatric/Behavioral: Negative.       Objective:   Physical Exam  Constitutional: She is oriented to person, place, and time. She appears well-developed and well-nourished.  HENT:  Head: Normocephalic and atraumatic.  Eyes: EOM are normal.  Neck: Normal range of motion.  Cardiovascular: Normal rate and regular rhythm.  Pulmonary/Chest: Effort normal and breath sounds normal. No respiratory distress. She has no wheezes. She has no rales.  Abdominal: Soft. Bowel sounds are normal. She exhibits no distension. There is no tenderness. There is no rebound.  Musculoskeletal: She exhibits no edema.  Neurological: She is alert  and oriented to person, place, and time. Coordination normal.  Skin: Skin is warm and dry.  Psychiatric: She has a normal mood and affect.   Vitals:   09/17/17 1527  BP: 110/78  Pulse: (!) 54  Temp: 97.7 F (36.5 C)  TempSrc: Oral  SpO2: 98%  Weight: 171 lb (77.6 kg)  Height: 5\' 6"  (1.676 m)      Assessment & Plan:

## 2017-09-18 DIAGNOSIS — N390 Urinary tract infection, site not specified: Secondary | ICD-10-CM | POA: Insufficient documentation

## 2017-09-18 NOTE — Assessment & Plan Note (Signed)
Resolved s/p treatment.

## 2017-09-18 NOTE — Assessment & Plan Note (Signed)
Taking pravastatin 20 mg daily. Recent lipid panel reviewed and at goal.

## 2017-09-18 NOTE — Assessment & Plan Note (Signed)
Originally on synthroid for suppression and now on synthroid 50 mcg daily and was unable to get off. Recent thyroid labs reviewed so none today. Refill as needed.

## 2017-09-18 NOTE — Assessment & Plan Note (Signed)
Will try premarin cream to reduce uti.

## 2017-09-21 ENCOUNTER — Encounter: Payer: Self-pay | Admitting: Sports Medicine

## 2017-09-21 ENCOUNTER — Ambulatory Visit: Payer: Medicare Other | Admitting: Sports Medicine

## 2017-09-21 ENCOUNTER — Ambulatory Visit
Admission: RE | Admit: 2017-09-21 | Discharge: 2017-09-21 | Disposition: A | Discharge status: Home / Self Care | Payer: Medicare Other | Source: Ambulatory Visit | Attending: Sports Medicine | Admitting: Sports Medicine

## 2017-09-21 VITALS — BP 123/74 | Ht 66.5 in | Wt 170.0 lb

## 2017-09-21 DIAGNOSIS — M542 Cervicalgia: Secondary | ICD-10-CM

## 2017-09-21 DIAGNOSIS — M5030 Other cervical disc degeneration, unspecified cervical region: Secondary | ICD-10-CM | POA: Diagnosis not present

## 2017-09-21 NOTE — Progress Notes (Signed)
   Subjective:    Patient ID: [name], female    DOB: 10/25/1940, 77 y.o.   MRN: [id number]  HPI chief complaint: Neck pain  [name] comes in today complaining of several months of right-sided neck pain. She denies any trauma. Pain has been gradual in onset. She'll occasionally notice "swelling". She has also noticed a limited ability to rotate her head to the right. No difficulty rotating to the left. She has had problems with her neck in the past. 9 years ago she had some cervical radiculopathy in the left arm which resolved with epidural steroid injections. She currently denies any radiculopathy in either arm. Pain is most noticeable with cervical rotation and side bending. She's been taking 400-600 mg of ibuprofen as needed which has been somewhat helpful. Some pain at night.  Interim medical history reviewed Medications reviewed Allergies reviewed    Review of Systems    as above Objective:   Physical Exam  Well-developed, well-nourished. No acute distress.  Cervical spine: Patient has full cervical rotation to the left. Limited cervical rotation to the right by about 40%. Good flexion and extension. No tenderness to palpation along cervical midline. She is tender to palpation along the right side of the neck in the area of the facets. No soft tissue swelling. No spasm.  Neurological exam shows no gross deficit of either upper or lower extremities.  X-rays of the cervical spine including AP and lateral views demonstrate multilevel degenerative disc disease as well as facet arthropathy. Nothing acute.      Assessment & Plan:   Neck pain secondary to cervical degenerative disc disease versus facet arthropathy  I think her pain is facet mediated. I also think that the "swelling" that she is describing is likely spasm of the overlying musculature.She will continue with her current home exercises and will add isometric neck exercises as well.I will also refer her for some formal  physical therapy. Follow-up with me again in 4 weeks for reevaluation.If symptoms persist or worsen then I would consider MRI in anticipation of referral for facet injections. I've also recommended that she apply moist heat as needed.

## 2017-10-01 ENCOUNTER — Encounter: Payer: Self-pay | Admitting: Physical Therapy

## 2017-10-01 ENCOUNTER — Ambulatory Visit: Payer: Medicare Other | Attending: Sports Medicine | Admitting: Physical Therapy

## 2017-10-01 ENCOUNTER — Other Ambulatory Visit: Payer: Self-pay

## 2017-10-01 DIAGNOSIS — M62838 Other muscle spasm: Secondary | ICD-10-CM | POA: Diagnosis present

## 2017-10-01 DIAGNOSIS — M542 Cervicalgia: Secondary | ICD-10-CM | POA: Diagnosis present

## 2017-10-01 NOTE — Therapy (Signed)
[facility] [address] [place], [place], [address] Phone: [phone]   Fax:  [phone]  Physical Therapy Treatment  Patient Details  Name: [name] MRN: [id number] Date of Birth: 09/26/1940 Referring Provider: Dr. Reino Bellisimothy Draper   Encounter Date: 10/01/2017  PT End of Session - 10/01/17 1611    Visit Number  1    Date for PT Re-Evaluation  11/26/17    Authorization Type  [employer]    PT Start Time  1530    PT Stop Time  1610    PT Time Calculation (min)  40 min    Activity Tolerance  Patient tolerated treatment well    Behavior During Therapy  [facility] for tasks assessed/performed       Past Medical History:  Diagnosis Date  . Chronic hepatitis C (HCC) 2014   DID HARVONI AND RIBIVORIN [place]   . Headache    MIGRAINE AURA NO HEADACHE  . History of blood transfusion 1990   AFTER GI BLEED  . Hyperlipidemia   . Hypertension   . Multinodular goiter    SHRANK AWAY 25 YRS AGO  . Osteoarthritis    OA  . Palpitations    PACs & PVCs  . Pneumonia YRS AGO    Past Surgical History:  Procedure Laterality Date  . TOTAL KNEE ARTHROPLASTY Left 04/28/2016   Procedure: LEFT TOTAL KNEE ARTHROPLASTY;  Surgeon: Ollen GrossAluisio, Frank, MD;  Location: [facility];  Service: Orthopedics;  Laterality: Left;  . TUBAL LIGATION      There were no vitals filed for this visit.  Subjective Assessment - 10/01/17 1536    Subjective  Patient reports 3-4 months ago her cervical pain came with sudden onset. Has an area in the cervical muscle that will swell up. Difficulty turning to the right.  Pain in neck with walking    Patient Stated Goals  learn exercises to reduce pain and mobility    Currently in Pain?  Yes    Pain Score  3     Pain Location  Neck    Pain Orientation  Right    Pain Descriptors / Indicators  Tightness    Pain Type  Acute pain    Pain Radiating Towards  mild aching headaches    Pain Onset  More than a month ago    Pain  Frequency  Constant    Aggravating Factors   walking the dogs, turning to the right, get up in the middle of the night due to pain    Pain Relieving Factors  neck stretches    Multiple Pain Sites  No         [facility] PT Assessment - 10/01/17 0001      Assessment   Medical Diagnosis  M54.2 Neck Pain    Referring Provider  Dr. Reino Bellisimothy Draper    Onset Date/Surgical Date  04/24/17    Prior Therapy  past cervical pain      Precautions   Precautions  None      Restrictions   Weight Bearing Restrictions  No      Balance Screen   Has the patient fallen in the past 6 months  No    Has the patient had a decrease in activity level because of a fear of falling?   No    Is the patient reluctant to leave their home because of a fear of falling?   No      Home Environment   Living  Environment  Private residence      Prior Function   Level of Independence  Independent    Leisure  ride recumbent 3 times per week      Cognition   Overall Cognitive Status  Within Functional Limits for tasks assessed      Observation/Other Assessments   Focus on Therapeutic Outcomes (FOTO)   44% limitation; goal is 39% limitation      Posture/Postural Control   Posture/Postural Control  No significant limitations      ROM / Strength   AROM / PROM / Strength  AROM;PROM;Strength      AROM   Cervical Flexion  40    Cervical Extension  20    Cervical - Right Side Bend  40    Cervical - Left Side Bend  25    Cervical - Right Rotation  65    Cervical - Left Rotation  70      Strength   Overall Strength Comments  bilateral shoulder strength 5/5      Palpation   Palpation comment  big muscle knot on the right side of C4-C6                   [facility] Adult PT Treatment/Exercise - 10/01/17 0001      Manual Therapy   Manual Therapy  Soft tissue mobilization    Soft tissue mobilization  cervical paraspinals and suboccipitals             PT Education - 10/01/17 1609    Education  Details  Access Code: [email or web address]     Person(s) Educated  Patient    Methods  Explanation;Demonstration;Handout    Comprehension  Verbalized understanding;Returned demonstration       PT Short Term Goals - 10/01/17 1632      PT SHORT TERM GOAL #1   Title  independent with intial HEP    Time  4    Period  Weeks    Status  New    Target Date  10/29/17      PT SHORT TERM GOAL #2   Title  walking her dogs with pain decreased >/= 25%    Time  4    Period  Weeks    Status  New    Target Date  10/29/17      PT SHORT TERM GOAL #3   Title  sleep with pain decreased >/= 25%    Time  4    Period  Weeks    Status  New    Target Date  10/29/17      PT SHORT TERM GOAL #4   Title  reduce headaches intensity and frequency >/= 25%    Time  4    Period  Weeks    Status  New    Target Date  10/29/17        PT Long Term Goals - 10/01/17 1540      PT LONG TERM GOAL #1   Title  understand correct body mechanics to  reduce the strain of her neck during daily tasks especially with the computer    Time  8    Period  Weeks    Status  New      PT LONG TERM GOAL #2   Title  independent with HEP and understand how to prgress herself    Time  8    Period  Weeks    Status  New    Target Date  11/26/17      PT LONG TERM GOAL #3   Title  reduce headaches with intensity and frequency >/= 70%    Time  8    Period  Weeks    Status  New    Target Date  11/26/17      PT LONG TERM GOAL #4   Title  increased cervical right rotation so patient is able to look behind her to back up the car without difficulty    Time  8    Period  Weeks    Status  New    Target Date  11/26/17      PT LONG TERM GOAL #5   Title  sleep with pain decreased >/= 75% so she is not waking up in the middle of the night    Time  8    Period  Weeks    Status  New    Target Date  11/26/17            Plan - 10/01/17 1625    Clinical Impression Statement  Patient is 77 year old female with cervical pain that  started 6 months ago with sudden onset. Patient constant right cervical pain is 3/10 with turning head to right, sleeping and walking her dogs.  Bilateral shoulder strength is 5/5.  Muscle knot felt in right side of C3-C6.  Tenderness located in bilateral cervical paraspinals. Patient has decreased cervical ROM for extension and sidebending. Patient will benefit from skilled therapy to improve cervical motion and reduce pain to return to prior activity.     History and Personal Factors relevant to plan of care:  None    Clinical Presentation  Stable    Clinical Presentation due to:  stable condition    Clinical Decision Making  Low    Rehab Potential  Excellent    Clinical Impairments Affecting Rehab Potential  None    PT Frequency  2x / week    PT Duration  8 weeks    PT Treatment/Interventions  Cryotherapy;Electrical Stimulation;Moist Heat;Iontophoresis 4mg /ml Dexamethasone;Ultrasound;Traction;Therapeutic activities;Therapeutic exercise;Neuromuscular re-education;Patient/family education;Manual techniques;Passive range of motion;Dry needling    PT Next Visit Plan  joint mobilization to cervical; soft tissue work; Estate manager/land agent; Cervical ROM snags    Consulted and Agree with Plan of Care  Patient       Patient will benefit from skilled therapeutic intervention in order to improve the following deficits and impairments:  Pain, Increased fascial restricitons, Increased muscle spasms, Decreased range of motion, Decreased activity tolerance, Decreased endurance  Visit Diagnosis: Cervicalgia - Plan: PT plan of care cert/re-cert  Other muscle spasm - Plan: PT plan of care cert/re-cert     Problem List Patient Active Problem List   Diagnosis Date Noted  . Recurrent UTI 09/18/2017  . Syncope 07/03/2013  . Hx of hepatitis C 07/09/2009  . NONTOXIC MULTINODULAR GOITER 07/09/2009  . Hyperlipidemia 07/09/2009  . OA (osteoarthritis) of knee 07/09/2009  . SPONDYLOSIS, CERVICAL, WITH  RADICULOPATHY 07/09/2009  . PALPITATIONS, RECURRENT 07/09/2009    Eulis Foster, PT 10/01/17 5:06 PM   [facility] [address] [place], [place], [address] Phone: [phone]   Fax:  [phone]  Name: [name] MRN: [id number] Date of Birth: [birth year]

## 2017-10-01 NOTE — Patient Instructions (Signed)
Access Code: [email or web address]  URL: [email or web address]  Date: 10/01/2017  Prepared by: Eulis Fosterheryl Gray   Exercises  Supine Cervical Retraction with Towel - 5 reps - 1 sets - 5 sec hold - 1x daily - 7x weekly  Supine Shoulder Press - 5 reps - 1 sets - 5 sec hold - 1x daily - 7x weekly  Seated Cervical Sidebending Stretch - 2 reps - 1 sets - 15 sec hold - 1x daily - 7x weekly  Seated Levator Scapulae Stretch - 2 reps - 1 sets - 15 sec hold - 1x daily - 7x weekly  [facility] [address], [address] [place], [place] [address] Phone # [phone] Fax [phone]

## 2017-10-02 ENCOUNTER — Ambulatory Visit: Payer: Medicare Other | Admitting: Physical Therapy

## 2017-10-02 ENCOUNTER — Encounter: Payer: Self-pay | Admitting: Physical Therapy

## 2017-10-02 DIAGNOSIS — M542 Cervicalgia: Secondary | ICD-10-CM | POA: Diagnosis not present

## 2017-10-02 DIAGNOSIS — M62838 Other muscle spasm: Secondary | ICD-10-CM

## 2017-10-02 NOTE — Patient Instructions (Addendum)
Access Code: [email or web address]  URL: [email or web address]  Date: 10/02/2017  Prepared by: Eulis Fosterheryl Gray   Exercises  Supine Cervical Retraction with Towel - 5 reps - 1 sets - 5 sec hold - 1x daily - 7x weekly  Supine Shoulder Press - 5 reps - 1 sets - 5 sec hold - 1x daily - 7x weekly  Seated Cervical Sidebending Stretch - 2 reps - 1 sets - 15 sec hold - 1x daily - 7x weekly  Seated Levator Scapulae Stretch - 2 reps - 1 sets - 15 sec hold - 1x daily - 7x weekly  Supine Cervical Flexion Extension on Pillow - 10 reps - 1 sets - 1x daily - 7x weekly  Supine Bilateral Shoulder External Rotation with Resistance - 10 reps - 1 sets - 5 sec hold - 1x daily - 7x weekly  Supine Alternating Shoulder Flexion - 10 reps - 1 sets - 1x daily - 7x weekly  [facility] [address], [address] [place], [place] [address] Phone # [phone] Fax [phone]  Trigger Point Dry Needling  . What is Trigger Point Dry Needling (DN)? o DN is a physical therapy technique used to treat muscle pain and dysfunction. Specifically, DN helps deactivate muscle trigger points (muscle knots).  o A thin filiform needle is used to penetrate the skin and stimulate the underlying trigger point. The goal is for a local twitch response (LTR) to occur and for the trigger point to relax. No medication of any kind is injected during the procedure.   . What Does Trigger Point Dry Needling Feel Like?  o The procedure feels different for each individual patient. Some patients report that they do not actually feel the needle enter the skin and overall the process is not painful. Very mild bleeding may occur. However, many patients feel a deep cramping in the muscle in which the needle was inserted. This is the local twitch response.   Marland Kitchen. How Will I feel after the treatment? o Soreness is normal, and the onset of soreness may not occur for a few hours. Typically this soreness does not last longer than two days.   o Bruising is uncommon, however; ice can be used to decrease any possible bruising.  o In rare cases feeling tired or nauseous after the treatment is normal. In addition, your symptoms may get worse before they get better, this period will typically not last longer than 24 hours.   . What Can I do After My Treatment? o Increase your hydration by drinking more water for the next 24 hours. o You may place ice or heat on the areas treated that have become sore, however, do not use heat on inflamed or bruised areas. Heat often brings more relief post needling. o You can continue your regular activities, but vigorous activity is not recommended initially after the treatment for 24 hours. o DN is best combined with other physical therapy such as strengthening, stretching, and other therapies.    [facility] [address], [address] [place], [place] [address] Phone # [phone] Fax [phone]

## 2017-10-02 NOTE — Therapy (Signed)
[facility] [address], [address] [place], [place], [address] Phone: [phone]   Fax:  [phone]  Physical Therapy Treatment  Patient Details  Name: [name] MRN: [id number] Date of Birth: Apr 29, 1940 Referring Provider: Dr. Reino Bellis   Encounter Date: 10/02/2017  PT End of Session - 10/02/17 0842    Visit Number  2    Date for PT Re-Evaluation  11/26/17    Authorization Type  UHC    PT Start Time  0800    PT Stop Time  0840    PT Time Calculation (min)  40 min    Activity Tolerance  Patient tolerated treatment well    Behavior During Therapy  [facility] for tasks assessed/performed       Past Medical History:  Diagnosis Date  . Chronic hepatitis C (HCC) 2014   DID HARVONI AND RIBIVORIN [place]   . Headache    MIGRAINE AURA NO HEADACHE  . History of blood transfusion 1990   AFTER GI BLEED  . Hyperlipidemia   . Hypertension   . Multinodular goiter    SHRANK AWAY 25 YRS AGO  . Osteoarthritis    OA  . Palpitations    PACs & PVCs  . Pneumonia YRS AGO    Past Surgical History:  Procedure Laterality Date  . TOTAL KNEE ARTHROPLASTY Left 04/28/2016   Procedure: LEFT TOTAL KNEE ARTHROPLASTY;  Surgeon: Ollen Gross, MD;  Location: [facility];  Service: Orthopedics;  Laterality: Left;  . TUBAL LIGATION      There were no vitals filed for this visit.  Subjective Assessment - 10/02/17 0806    Subjective  I woke up one time to go to the bathroom and went back to sleep for the first time.     Patient Stated Goals  learn exercises to reduce pain and mobility    Currently in Pain?  Yes    Pain Score  1     Pain Location  Neck    Pain Orientation  Right    Pain Descriptors / Indicators  Tender;Tightness    Pain Type  Acute pain    Pain Onset  More than a month ago    Pain Frequency  Constant    Aggravating Factors   walking the dogs, turning to the right, get up in the middle of the night due to pain    Pain Relieving  Factors  neck stretches    Multiple Pain Sites  No                       [facility] Adult PT Treatment/Exercise - 10/02/17 0001      Manual Therapy   Manual Therapy  Soft tissue mobilization;Joint mobilization    Joint Mobilization  side glide to C3-C7    Soft tissue mobilization  cervical paraspinals, scalenes, suboccipitals, upper trap       Trigger Point Dry Needling - 10/02/17 0824    Consent Given?  Yes    Education Handout Provided  Yes    Muscles Treated Upper Body  -- right cervical multfidi, cervical paraspinal    Muscles Treated Lower Body  -- elnogation of tissue and trigger point response           PT Education - 10/02/17 0809    Education Details  TAccess Code: [email or web address] ; education on posture at the computer and smart phone; information on dry needling    Person(s) Educated  Patient  Methods  Explanation;Demonstration;Verbal cues;Handout    Comprehension  Returned demonstration;Verbalized understanding       PT Short Term Goals - 10/01/17 1632      PT SHORT TERM GOAL #1   Title  independent with intial HEP    Time  4    Period  Weeks    Status  New    Target Date  10/29/17      PT SHORT TERM GOAL #2   Title  walking her dogs with pain decreased >/= 25%    Time  4    Period  Weeks    Status  New    Target Date  10/29/17      PT SHORT TERM GOAL #3   Title  sleep with pain decreased >/= 25%    Time  4    Period  Weeks    Status  New    Target Date  10/29/17      PT SHORT TERM GOAL #4   Title  reduce headaches intensity and frequency >/= 25%    Time  4    Period  Weeks    Status  New    Target Date  10/29/17        PT Long Term Goals - 10/01/17 1540      PT LONG TERM GOAL #1   Title  understand correct body mechanics to  reduce the strain of her neck during daily tasks especially with the computer    Time  8    Period  Weeks    Status  New      PT LONG TERM GOAL #2   Title  independent with HEP and understand how to  prgress herself    Time  8    Period  Weeks    Status  New    Target Date  11/26/17      PT LONG TERM GOAL #3   Title  reduce headaches with intensity and frequency >/= 70%    Time  8    Period  Weeks    Status  New    Target Date  11/26/17      PT LONG TERM GOAL #4   Title  increased cervical right rotation so patient is able to look behind her to back up the car without difficulty    Time  8    Period  Weeks    Status  New    Target Date  11/26/17      PT LONG TERM GOAL #5   Title  sleep with pain decreased >/= 75% so she is not waking up in the middle of the night    Time  8    Period  Weeks    Status  New    Target Date  11/26/17            Plan - 10/02/17 0809    Clinical Impression Statement  Patient was able to sleep without her neck waking her up.  Patient had decreased size of the muscle knot on the right cervical.  Patient also has trigger points in the left cervical and upper trap that will be addressed next visit. Patient will benefit from skilled therapy to improve cervical motion and reduce pain to return to prior activity.     Rehab Potential  Excellent    Clinical Impairments Affecting Rehab Potential  None    PT Frequency  2x / week    PT Duration  8 weeks  PT Treatment/Interventions  Cryotherapy;Electrical Stimulation;Moist Heat;Iontophoresis 4mg /ml Dexamethasone;Ultrasound;Traction;Therapeutic activities;Therapeutic exercise;Neuromuscular re-education;Patient/family education;Manual techniques;Passive range of motion;Dry needling    PT Next Visit Plan  joint mobilization to cervical; soft tissue work; Estate manager/land agentbody mechanics; Cervical ROM snags; dry needling to left cervical and upper trap    PT Home Exercise Plan  Access Code: [email or web address]     Consulted and Agree with Plan of Care  Patient       Patient will benefit from skilled therapeutic intervention in order to improve the following deficits and impairments:  Pain, Increased fascial restricitons, Increased  muscle spasms, Decreased range of motion, Decreased activity tolerance, Decreased endurance  Visit Diagnosis: Cervicalgia  Other muscle spasm     Problem List Patient Active Problem List   Diagnosis Date Noted  . Recurrent UTI 09/18/2017  . Syncope 07/03/2013  . Hx of hepatitis C 07/09/2009  . NONTOXIC MULTINODULAR GOITER 07/09/2009  . Hyperlipidemia 07/09/2009  . OA (osteoarthritis) of knee 07/09/2009  . SPONDYLOSIS, CERVICAL, WITH RADICULOPATHY 07/09/2009  . PALPITATIONS, RECURRENT 07/09/2009    Eulis Fosterheryl Vonna Brabson, PT 10/02/17 8:46 AM   [facility] [address], [address] [place], [place], [address] Phone: [phone]   Fax:  [phone]  Name: Pennie RushingBetty R Baumgart MRN: [id number] Date of Birth: 05/02/1940

## 2017-10-07 ENCOUNTER — Encounter: Payer: Self-pay | Admitting: Physical Therapy

## 2017-10-07 ENCOUNTER — Ambulatory Visit: Payer: Medicare Other | Admitting: Physical Therapy

## 2017-10-07 DIAGNOSIS — M542 Cervicalgia: Secondary | ICD-10-CM

## 2017-10-07 DIAGNOSIS — M62838 Other muscle spasm: Secondary | ICD-10-CM

## 2017-10-07 NOTE — Patient Instructions (Signed)
Side Pull: Double Arm    On back, knees bent, feet flat. Arms perpendicular to body, shoulder level, elbows straight but relaxed. Pull arms out to sides, elbows straight. Resistance band comes across collarbones, hands toward floor. Hold momentarily. Slowly return to starting position. Repeat _10__ times. Band color ___yellow__    Copyright  VHI. All rights reserved.  [place]ash    On back, knees bent, feet flat, left hand on left hip, right hand above left. Pull right arm DIAGONALLY (hip to shoulder) across chest. Bring right arm along head toward floor. Hold momentarily. Slowly return to starting position. Repeat __10_ times. Do with left arm. Band color __yellow____   Copyright  VHI. All rights reserved.  Shoulder Rotation: Double Arm    On back, knees bent, feet flat, elbows tucked at sides, bent 90, hands palms up. Pull hands apart and down toward floor, keeping elbows near sides. Hold momentarily. Slowly return to starting position. Repeat _10__ times. Band color __yellow____   Copyright  VHI. All rights reserved.  [facility] [address], [address] [place], [place] [address] Phone # [phone] Fax [phone]

## 2017-10-07 NOTE — Therapy (Signed)
[facility] [address], [address] [place], [place], [address] Phone: [phone]   Fax:  [phone]  Physical Therapy Treatment  Patient Details  Name: [name] MRN: [id number] Date of Birth: 12/28/1940 Referring Provider: Dr. Reino Bellis   Encounter Date: 10/07/2017  PT End of Session - 10/07/17 1409    Visit Number  3    Date for PT Re-Evaluation  11/26/17    Authorization Type  [employer]    PT Start Time  1400    PT Stop Time  1440    PT Time Calculation (min)  40 min    Activity Tolerance  Patient tolerated treatment well    Behavior During Therapy  [facility] for tasks assessed/performed       Past Medical History:  Diagnosis Date  . Chronic hepatitis C (HCC) 2014   DID HARVONI AND RIBIVORIN [place]   . Headache    MIGRAINE AURA NO HEADACHE  . History of blood transfusion 1990   AFTER GI BLEED  . Hyperlipidemia   . Hypertension   . Multinodular goiter    SHRANK AWAY 25 YRS AGO  . Osteoarthritis    OA  . Palpitations    PACs & PVCs  . Pneumonia YRS AGO    Past Surgical History:  Procedure Laterality Date  . TOTAL KNEE ARTHROPLASTY Left 04/28/2016   Procedure: LEFT TOTAL KNEE ARTHROPLASTY;  Surgeon: Ollen Gross, MD;  Location: [facility];  Service: Orthopedics;  Laterality: Left;  . TUBAL LIGATION      There were no vitals filed for this visit.  Subjective Assessment - 10/07/17 1407    Subjective  I am not having headaches.  I do not have as much pain. The dry needling helped . I feel like I need to work on my posture.     Patient Stated Goals  learn exercises to reduce pain and mobility    Currently in Pain?  Yes    Pain Score  3     Pain Location  Neck    Pain Orientation  Right    Pain Descriptors / Indicators  Tightness;Tender    Pain Type  Acute pain    Pain Onset  More than a month ago    Pain Frequency  Constant    Aggravating Factors   walking the dogs, turning to the right, get up in the middle of  the night due to pain.     Pain Relieving Factors  neck stretches    Multiple Pain Sites  No                       [facility] Adult PT Treatment/Exercise - 10/07/17 0001      Neck Exercises: Machines for Strengthening   UBE (Upper Arm Bike)  2 min forward, 2 min backward level 1      Neck Exercises: Supine   Neck Retraction  10 reps;5 secs;Other (comment) head on red physioball    Capital Flexion  10 reps head on red physioball    Cervical Rotation  Right;Left;10 reps head on red physioball    Shoulder Flexion  Left;Right;15 reps with head on red physioball      Manual Therapy   Manual Therapy  Soft tissue mobilization    Soft tissue mobilization  cervical paraspinals, scalenes, suboccipitals, upper trap       Trigger Point Dry Needling - 10/07/17 1439    Consent Given?  Yes  Muscles Treated Upper Body  Oblique capitus;Upper trapezius    Muscles Treated Lower Body  -- cervical multifidi           PT Education - 10/07/17 1425    Education Details  supine yellow theraband postural exercises    Person(s) Educated  Patient    Methods  Explanation;Demonstration    Comprehension  Verbalized understanding;Returned demonstration       PT Short Term Goals - 10/01/17 1632      PT SHORT TERM GOAL #1   Title  independent with intial HEP    Time  4    Period  Weeks    Status  New    Target Date  10/29/17      PT SHORT TERM GOAL #2   Title  walking her dogs with pain decreased >/= 25%    Time  4    Period  Weeks    Status  New    Target Date  10/29/17      PT SHORT TERM GOAL #3   Title  sleep with pain decreased >/= 25%    Time  4    Period  Weeks    Status  New    Target Date  10/29/17      PT SHORT TERM GOAL #4   Title  reduce headaches intensity and frequency >/= 25%    Time  4    Period  Weeks    Status  New    Target Date  10/29/17        PT Long Term Goals - 10/01/17 1540      PT LONG TERM GOAL #1   Title  understand correct body  mechanics to  reduce the strain of her neck during daily tasks especially with the computer    Time  8    Period  Weeks    Status  New      PT LONG TERM GOAL #2   Title  independent with HEP and understand how to prgress herself    Time  8    Period  Weeks    Status  New    Target Date  11/26/17      PT LONG TERM GOAL #3   Title  reduce headaches with intensity and frequency >/= 70%    Time  8    Period  Weeks    Status  New    Target Date  11/26/17      PT LONG TERM GOAL #4   Title  increased cervical right rotation so patient is able to look behind her to back up the car without difficulty    Time  8    Period  Weeks    Status  New    Target Date  11/26/17      PT LONG TERM GOAL #5   Title  sleep with pain decreased >/= 75% so she is not waking up in the middle of the night    Time  8    Period  Weeks    Status  New    Target Date  11/26/17            Plan - 10/07/17 1410    Clinical Impression Statement  Patient reports she has not had a headache since last visit and having less pain.  Patient pain is still constant but not as intense.  Patient has trigger points in upper trap and cervical paraspinals . Patient has learned exercises to  work the cervical and thoracic area strength. Patient will benefit from skilled therapy to improve cervical motion and reduce pain to return to prior activity.     Rehab Potential  Excellent    Clinical Impairments Affecting Rehab Potential  None    PT Frequency  2x / week    PT Duration  8 weeks    PT Treatment/Interventions  Cryotherapy;Electrical Stimulation;Moist Heat;Iontophoresis 4mg /ml Dexamethasone;Ultrasound;Traction;Therapeutic activities;Therapeutic exercise;Neuromuscular re-education;Patient/family education;Manual techniques;Passive range of motion;Dry needling    PT Next Visit Plan  joint mobilization to cervical; soft tissue work; Estate manager/land agent; Cervical ROM snags    PT Home Exercise Plan  Access Code: [email or web address]      Recommended Other Services  M D signed the initial evaluation    Consulted and Agree with Plan of Care  Patient       Patient will benefit from skilled therapeutic intervention in order to improve the following deficits and impairments:  Pain, Increased fascial restricitons, Increased muscle spasms, Decreased range of motion, Decreased activity tolerance, Decreased endurance  Visit Diagnosis: Cervicalgia  Other muscle spasm     Problem List Patient Active Problem List   Diagnosis Date Noted  . Recurrent UTI 09/18/2017  . Syncope 07/03/2013  . Hx of hepatitis C 07/09/2009  . NONTOXIC MULTINODULAR GOITER 07/09/2009  . Hyperlipidemia 07/09/2009  . OA (osteoarthritis) of knee 07/09/2009  . SPONDYLOSIS, CERVICAL, WITH RADICULOPATHY 07/09/2009  . PALPITATIONS, RECURRENT 07/09/2009    Eulis Foster, PT 10/07/17 2:43 PM   [facility] [address] [place], [place], [address] Phone: [phone]   Fax:  [phone]  Name: [name] MRN: [id number] Date of Birth: 1941-01-13

## 2017-10-08 ENCOUNTER — Encounter: Payer: Self-pay | Admitting: Physical Therapy

## 2017-10-08 ENCOUNTER — Ambulatory Visit: Payer: Medicare Other | Admitting: Physical Therapy

## 2017-10-08 DIAGNOSIS — M542 Cervicalgia: Secondary | ICD-10-CM | POA: Diagnosis not present

## 2017-10-08 DIAGNOSIS — M62838 Other muscle spasm: Secondary | ICD-10-CM

## 2017-10-08 NOTE — Therapy (Signed)
[facility] [address], [address] [place], [place], [address] Phone: [phone]   Fax:  [phone]  Physical Therapy Treatment  Patient Details  Name: [name] MRN: [id number] Date of Birth: 08/31/1940 Referring Provider: Dr. Reino Bellisimothy Draper   Encounter Date: 10/08/2017  PT End of Session - 10/08/17 1443    Visit Number  4    Date for PT Re-Evaluation  11/26/17    Authorization Type  UHC    PT Start Time  1400    PT Stop Time  1440    PT Time Calculation (min)  40 min    Activity Tolerance  Patient tolerated treatment well    Behavior During Therapy  [facility] for tasks assessed/performed       Past Medical History:  Diagnosis Date  . Chronic hepatitis C (HCC) 2014   DID HARVONI AND RIBIVORIN [place]   . Headache    MIGRAINE AURA NO HEADACHE  . History of blood transfusion 1990   AFTER GI BLEED  . Hyperlipidemia   . Hypertension   . Multinodular goiter    SHRANK AWAY 25 YRS AGO  . Osteoarthritis    OA  . Palpitations    PACs & PVCs  . Pneumonia YRS AGO    Past Surgical History:  Procedure Laterality Date  . TOTAL KNEE ARTHROPLASTY Left 04/28/2016   Procedure: LEFT TOTAL KNEE ARTHROPLASTY;  Surgeon: Ollen GrossAluisio, Frank, MD;  Location: [facility];  Service: Orthopedics;  Laterality: Left;  . TUBAL LIGATION      There were no vitals filed for this visit.  Subjective Assessment - 10/08/17 1405    Subjective  I am feeling good. One spot is being persistent.     Patient Stated Goals  learn exercises to reduce pain and mobility    Currently in Pain?  No/denies    Multiple Pain Sites  No         [facility] PT Assessment - 10/08/17 0001      AROM   Cervical Extension  25    Cervical - Left Side Bend  30    Cervical - Right Rotation  70                   [facility] Adult PT Treatment/Exercise - 10/08/17 0001      Neck Exercises: Machines for Strengthening   UBE (Upper Arm Bike)  2 min forward, 2 min backward  level 1      Neck Exercises: Standing   Wall Push Ups  10 reps      Neck Exercises: Supine   Neck Retraction  10 reps;5 secs;Other (comment) head on red physioball    Capital Flexion  10 reps head on red physioball    Cervical Rotation  Right;Left;10 reps head on red physioball    Shoulder Flexion  Left;Right;15 reps with head on red physioball    Shoulder ABduction  -- horizontal abduction with yellow band with head on physiobal    Other Supine Exercise  y motion holding the yellow band with head on physioball      Manual Therapy   Manual Therapy  Soft tissue mobilization    Soft tissue mobilization  cervical paraspinals, scalenes, suboccipitals, upper trap       Trigger Point Dry Needling - 10/07/17 1439    Consent Given?  Yes    Muscles Treated Upper Body  Oblique capitus;Upper trapezius    Muscles Treated Lower Body  -- cervical multifidi  PT Education - 10/07/17 1425    Education Details  supine yellow theraband postural exercises    Person(s) Educated  Patient    Methods  Explanation;Demonstration    Comprehension  Verbalized understanding;Returned demonstration       PT Short Term Goals - 10/08/17 1445      PT SHORT TERM GOAL #1   Title  independent with intial HEP    Time  4    Period  Weeks    Status  Achieved      PT SHORT TERM GOAL #2   Title  walking her dogs with pain decreased >/= 25%    Time  4    Period  Weeks    Status  Achieved      PT SHORT TERM GOAL #3   Title  sleep with pain decreased >/= 25%    Time  4    Period  Weeks    Status  On-going      PT SHORT TERM GOAL #4   Title  reduce headaches intensity and frequency >/= 25%    Time  4    Period  Weeks    Status  Achieved        PT Long Term Goals - 10/01/17 1540      PT LONG TERM GOAL #1   Title  understand correct body mechanics to  reduce the strain of her neck during daily tasks especially with the computer    Time  8    Period  Weeks    Status  New      PT  LONG TERM GOAL #2   Title  independent with HEP and understand how to prgress herself    Time  8    Period  Weeks    Status  New    Target Date  11/26/17      PT LONG TERM GOAL #3   Title  reduce headaches with intensity and frequency >/= 70%    Time  8    Period  Weeks    Status  New    Target Date  11/26/17      PT LONG TERM GOAL #4   Title  increased cervical right rotation so patient is able to look behind her to back up the car without difficulty    Time  8    Period  Weeks    Status  New    Target Date  11/26/17      PT LONG TERM GOAL #5   Title  sleep with pain decreased >/= 75% so she is not waking up in the middle of the night    Time  8    Period  Weeks    Status  New    Target Date  11/26/17            Plan - 10/08/17 1411    Clinical Impression Statement  Patient has increased cervical ROM.  Patient muscle knot on the right is decreased to a quater size instead of a golf ball size.  Patient is not having headaches.  Patient is aware of her posture.  Patient will benefit from skilled therapy to imporve cervical motion and reduce pain to return to prior activity.     Rehab Potential  Excellent    Clinical Impairments Affecting Rehab Potential  None    PT Frequency  2x / week    PT Duration  8 weeks    PT Treatment/Interventions  Cryotherapy;Electrical Stimulation;Moist  Heat;Iontophoresis 4mg /ml Dexamethasone;Ultrasound;Traction;Therapeutic activities;Therapeutic exercise;Neuromuscular re-education;Patient/family education;Manual techniques;Passive range of motion;Dry needling    PT Next Visit Plan  joint mobilization to cervical; soft tissue work; ; Cervical ROM snags    PT Home Exercise Plan  Access Code: [email or web address]     Consulted and Agree with Plan of Care  Patient       Patient will benefit from skilled therapeutic intervention in order to improve the following deficits and impairments:  Pain, Increased fascial restricitons, Increased muscle spasms,  Decreased range of motion, Decreased activity tolerance, Decreased endurance  Visit Diagnosis: Cervicalgia  Other muscle spasm     Problem List Patient Active Problem List   Diagnosis Date Noted  . Recurrent UTI 09/18/2017  . Syncope 07/03/2013  . Hx of hepatitis C 07/09/2009  . NONTOXIC MULTINODULAR GOITER 07/09/2009  . Hyperlipidemia 07/09/2009  . OA (osteoarthritis) of knee 07/09/2009  . SPONDYLOSIS, CERVICAL, WITH RADICULOPATHY 07/09/2009  . PALPITATIONS, RECURRENT 07/09/2009    Eulis Foster, PT 10/08/17 2:46 PM   [facility] [address], [address] [place], [place], [address] Phone: [phone]   Fax:  [phone]  Name: [name] MRN: [id number] Date of Birth: 05/12/1940

## 2017-10-10 ENCOUNTER — Ambulatory Visit: Payer: Medicare Other | Admitting: Family Medicine

## 2017-10-10 ENCOUNTER — Encounter: Payer: Self-pay | Admitting: Family Medicine

## 2017-10-10 VITALS — BP 118/64 | HR 76 | Temp 98.7°F | Ht 66.5 in | Wt 171.0 lb

## 2017-10-10 DIAGNOSIS — R05 Cough: Secondary | ICD-10-CM | POA: Diagnosis not present

## 2017-10-10 DIAGNOSIS — R059 Cough, unspecified: Secondary | ICD-10-CM

## 2017-10-10 MED ORDER — ALBUTEROL SULFATE HFA 108 (90 BASE) MCG/ACT IN AERS: 1.0000 | INHALATION_SPRAY | RESPIRATORY_TRACT | 0 refills | Status: DC | PRN

## 2017-10-10 MED ORDER — AZITHROMYCIN 250 MG PO TABS: ORAL_TABLET | ORAL | 0 refills | Status: DC

## 2017-10-10 MED ORDER — GUAIFENESIN-CODEINE 100-10 MG/5ML PO SOLN: 5.0000 mL | Freq: Three times a day (TID) | ORAL | 0 refills | Status: DC | PRN

## 2017-10-10 MED ORDER — METHYLPREDNISOLONE ACETATE 80 MG/ML IJ SUSP
80.0000 mg | Freq: Once | INTRAMUSCULAR | Status: AC
  Administered 2017-10-10: 80 mg via INTRAMUSCULAR

## 2017-10-10 NOTE — Progress Notes (Signed)
   Subjective:  Pennie RushingBetty R Merida is a 77 y.o. female who presents today for same-day appointment with a chief complaint of cough.   HPI:  Cough, Acute problem Started 1 week ago. Worsened over that time. Associated with sore throat and rhinorrhea. Tried delsym, sudafed and albuterol which has help modestly. Symptoms are worse at night. No sick contacts. Some subjective fevers but no chills. No other obvious alleviating or aggravating factors.  ROS: Per HPI  PMH: She reports that she has never smoked. She has never used smokeless tobacco. She reports that she drinks alcohol. She reports that she does not use drugs.  Objective:  Physical Exam: BP 118/64 (BP Location: Left Arm, Patient Position: Sitting, Cuff Size: Normal)   Pulse 76   Temp 98.7 F (37.1 C) (Oral)   Ht 5' 6.5" (1.689 m)   Wt 171 lb (77.6 kg)   SpO2 99%   BMI 27.19 kg/m   Gen: NAD, resting comfortably HEENT: TMs clear bilaterally.  Oropharynx slightly erythematous without exudate. CV: RRR with no murmurs appreciated Pulm: NWOB, scattered coarse breath sounds, otherwise clear to auscultation bilaterally  Assessment/Plan:  Cough Given his symptoms have worsened over last week, will start Z-Pak today.  Will give 80mg  IM depo-medrol for sore throat and reactive airway.  Refill patient's albuterol inhaler..Start guaifenesin-codeine for cough. Recommended tylenol and/or motrin as needed for low grade fever and pain. Encouraged good oral hydration. Return precautions reviewed. Follow up as needed.   Katina Degreealeb M. Jimmey RalphParker, MD 10/10/2017 11:06 AM

## 2017-10-10 NOTE — Patient Instructions (Signed)
Start the cough syrup for your cough.  Start the zpack.  Please stay well hydrated.  You can take tylenol and/or motrin as needed for low grade fever and pain.  Please let me know if your symptoms worsen or fail to improve.  Take care, Dr Jimmey RalphParker

## 2017-10-12 ENCOUNTER — Encounter: Payer: Medicare Other | Admitting: Physical Therapy

## 2017-10-14 ENCOUNTER — Encounter: Payer: Self-pay | Admitting: Internal Medicine

## 2017-10-14 ENCOUNTER — Encounter: Payer: Self-pay | Admitting: Physical Therapy

## 2017-10-14 ENCOUNTER — Ambulatory Visit: Payer: Medicare Other | Admitting: Internal Medicine

## 2017-10-14 ENCOUNTER — Ambulatory Visit: Payer: Medicare Other | Admitting: Physical Therapy

## 2017-10-14 DIAGNOSIS — M542 Cervicalgia: Secondary | ICD-10-CM

## 2017-10-14 DIAGNOSIS — M62838 Other muscle spasm: Secondary | ICD-10-CM

## 2017-10-14 DIAGNOSIS — R059 Cough, unspecified: Secondary | ICD-10-CM

## 2017-10-14 DIAGNOSIS — R05 Cough: Secondary | ICD-10-CM

## 2017-10-14 MED ORDER — FLUTICASONE FUROATE-VILANTEROL 100-25 MCG/INH IN AEPB: 1.0000 | INHALATION_SPRAY | Freq: Every day | RESPIRATORY_TRACT | 3 refills | Status: DC

## 2017-10-14 MED ORDER — HYDROCODONE-HOMATROPINE 5-1.5 MG/5ML PO SYRP: 5.0000 mL | ORAL_SOLUTION | Freq: Three times a day (TID) | ORAL | 0 refills | Status: DC | PRN

## 2017-10-14 NOTE — Progress Notes (Signed)
   Subjective:    Patient ID: [name], female    DOB: 06/28/1940, 77 y.o.   MRN: [id number]  HPI The patient is a 77 YO female coming in for cough. Seen about 4 days ago for cough as well and given depo-medrol 80 mg IM and azithromycin pack to take. The cough had been going on for about 1 week at that time. She has mild improvement in that time. She has had these spells where she starts with a simple infection and seems to get a long of inflammation in her lungs. She almost has asthma symptoms with bronchospasm and coughing fits. She got an episode of laryngospasm yesterday where she could not breathe for several seconds and this scared her.   Review of Systems  Constitutional: Positive for activity change, appetite change and chills. Negative for fatigue, fever and unexpected weight change.  HENT: Positive for congestion, postnasal drip and rhinorrhea. Negative for ear discharge, ear pain, sinus pressure, sinus pain, sneezing, sore throat, tinnitus, trouble swallowing and voice change.   Eyes: Negative.   Respiratory: Positive for cough and shortness of breath. Negative for chest tightness and wheezing.   Cardiovascular: Negative.   Gastrointestinal: Negative.   Neurological: Negative.       Objective:   Physical Exam  Constitutional: She is oriented to person, place, and time. She appears well-developed and well-nourished.  HENT:  Head: Normocephalic and atraumatic.  Oropharynx with redness and clear drainage, nose with swollen turbinates, TMs normal bilaterally  Eyes: EOM are normal.  Neck: Normal range of motion. No thyromegaly present.  Cardiovascular: Normal rate and regular rhythm.  Pulmonary/Chest: Effort normal and breath sounds normal. No respiratory distress. She has no wheezes. She has no rales.  Mild rhonchi clear with cough  Abdominal: Soft.  Musculoskeletal: She exhibits tenderness.  Lymphadenopathy:    She has no cervical adenopathy.  Neurological: She is alert  and oriented to person, place, and time.  Skin: Skin is warm and dry.   Vitals:   10/14/17 1031  BP: 100/70  Pulse: 64  Temp: 98.5 F (36.9 C)  TempSrc: Oral  SpO2: 97%  Weight: 173 lb (78.5 kg)  Height: 5' 6.5" (1.689 m)      Assessment & Plan:

## 2017-10-14 NOTE — Patient Instructions (Signed)
We have sent in the stronger cough medicine for you.   We have given you a sample of the inhaler to use daily 1 puff.

## 2017-10-14 NOTE — Therapy (Signed)
[facility] [address] [place], [place], [address] Phone: [phone]   Fax:  [phone]  Physical Therapy Treatment  Patient Details  Name: [name] MRN: [id number] Date of Birth: 1940/10/07 Referring Provider: Dr. Reino Bellis   Encounter Date: 10/14/2017  PT End of Session - 10/14/17 1404    Visit Number  5    Date for PT Re-Evaluation  11/26/17    Authorization Type  UHC    PT Start Time  1400    PT Stop Time  1440    PT Time Calculation (min)  40 min    Activity Tolerance  Patient tolerated treatment well    Behavior During Therapy  [facility] for tasks assessed/performed       Past Medical History:  Diagnosis Date  . Chronic hepatitis C (HCC) 2014   DID HARVONI AND RIBIVORIN [place]   . Headache    MIGRAINE AURA NO HEADACHE  . History of blood transfusion 1990   AFTER GI BLEED  . Hyperlipidemia   . Hypertension   . Multinodular goiter    SHRANK AWAY 25 YRS AGO  . Osteoarthritis    OA  . Palpitations    PACs & PVCs  . Pneumonia YRS AGO    Past Surgical History:  Procedure Laterality Date  . TOTAL KNEE ARTHROPLASTY Left 04/28/2016   Procedure: LEFT TOTAL KNEE ARTHROPLASTY;  Surgeon: Ollen Gross, MD;  Location: [facility];  Service: Orthopedics;  Laterality: Left;  . TUBAL LIGATION      There were no vitals filed for this visit.  Subjective Assessment - 10/14/17 1407    Subjective  Starting yesterday I have had discomfort on the right. I had a cold that puts me in a coughing fit. I have not been sleeping well due to the coughing.     Patient Stated Goals  learn exercises to reduce pain and mobility    Currently in Pain?  Yes    Pain Score  1     Pain Location  Neck    Pain Orientation  Right    Pain Descriptors / Indicators  Tightness;Tender    Pain Type  Acute pain    Pain Onset  More than a month ago    Pain Frequency  Constant    Aggravating Factors   walking the dogs, turning to the  right, get up in the middle of the night due to pain    Pain Relieving Factors  neck stretches    Multiple Pain Sites  No         [facility] PT Assessment - 10/14/17 0001      AROM   Cervical Extension  35    Cervical - Right Rotation  70    Cervical - Left Rotation  75                   [facility] Adult PT Treatment/Exercise - 10/14/17 0001      Neck Exercises: Machines for Strengthening   UBE (Upper Arm Bike)  2 min forward, 2 min backward level 1      Manual Therapy   Manual Therapy  Soft tissue mobilization;Joint mobilization    Joint Mobilization  side glide to C2-C5    Soft tissue mobilization  cervical paraspinals, scalenes, suboccipitals, upper trap       Trigger Point Dry Needling - 10/14/17 1440    Consent Given?  Yes    Muscles Treated Upper Body  Oblique  capitus C2-C4 multifidi    Oblique Capitus Response  Twitch response elicited;Palpable increased muscle length           PT Education - 10/14/17 1418    Education Details  Access Code: [email or web address]     Person(s) Educated  Patient    Methods  Explanation;Demonstration;Verbal cues;Handout    Comprehension  Returned demonstration;Verbalized understanding       PT Short Term Goals - 10/14/17 1410      PT SHORT TERM GOAL #3   Title  sleep with pain decreased >/= 25%    Time  4    Period  Weeks    Status  Achieved      PT SHORT TERM GOAL #4   Title  reduce headaches intensity and frequency >/= 25%    Time  4    Period  Weeks    Status  Achieved        PT Long Term Goals - 10/14/17 1409      PT LONG TERM GOAL #1   Title  understand correct body mechanics to  reduce the strain of her neck during daily tasks especially with the computer    Time  8    Period  Weeks    Status  On-going      PT LONG TERM GOAL #2   Title  independent with HEP and understand how to progress herself    Time  8    Period  Weeks    Status  On-going      PT LONG TERM GOAL #3   Title  reduce headaches with  intensity and frequency >/= 70%    Time  8    Period  Weeks    Status  Achieved      PT LONG TERM GOAL #4   Title  increased cervical right rotation so patient is able to look behind her to back up the car without difficulty    Time  8    Period  Weeks    Status  On-going      PT LONG TERM GOAL #5   Title  sleep with pain decreased >/= 75% so she is not waking up in the middle of the night    Time  8    Period  Weeks    Status  On-going            Plan - 10/14/17 1405    Clinical Impression Statement  Patient is not having headaches. Patient has been up coughing at night so she does not know if she has neck pain.  Patient has increased cervical extension and rotation.  Patient has decreased mobility of C2-C5.  Patient continues to have thickness on the right side of  C2-C5. Patient will benefit from skilled therapy to improve cervical motion and reduce pain to return to prior activity.     Rehab Potential  Excellent    Clinical Impairments Affecting Rehab Potential  None    PT Frequency  2x / week    PT Duration  8 weeks    PT Treatment/Interventions  Cryotherapy;Electrical Stimulation;Moist Heat;Iontophoresis 4mg /ml Dexamethasone;Ultrasound;Traction;Therapeutic activities;Therapeutic exercise;Neuromuscular re-education;Patient/family education;Manual techniques;Passive range of motion;Dry needling    PT Next Visit Plan  joint mobilization to cervical; soft tissue work;cervical throracis stabilization    PT Home Exercise Plan  Access Code: [email or web address]     Consulted and Agree with Plan of Care  Patient       Patient will benefit from skilled therapeutic intervention  in order to improve the following deficits and impairments:  Pain, Increased fascial restricitons, Increased muscle spasms, Decreased range of motion, Decreased activity tolerance, Decreased endurance  Visit Diagnosis: Cervicalgia  Other muscle spasm     Problem List Patient Active Problem List   Diagnosis  Date Noted  . Recurrent UTI 09/18/2017  . Syncope 07/03/2013  . Hx of hepatitis C 07/09/2009  . NONTOXIC MULTINODULAR GOITER 07/09/2009  . Hyperlipidemia 07/09/2009  . OA (osteoarthritis) of knee 07/09/2009  . SPONDYLOSIS, CERVICAL, WITH RADICULOPATHY 07/09/2009  . PALPITATIONS, RECURRENT 07/09/2009  . Cough 07/03/2009    Eulis Fosterheryl Timmy Bubeck, PT 10/14/17 2:45 PM   [facility] [address], [address] [place], [place], [address] Phone: [phone]   Fax:  [phone]  Name: [name] MRN: [id number] Date of Birth: 05/12/1940

## 2017-10-14 NOTE — Patient Instructions (Signed)
Access Code: [email or web address]  URL: [email or web address]  Date: 10/14/2017  Prepared by: Eulis Fosterheryl Britani Beattie   Exercises  Supine Cervical Retraction with Towel - 5 reps - 1 sets - 5 sec hold - 1x daily - 7x weekly  Supine Shoulder Press - 5 reps - 1 sets - 5 sec hold - 1x daily - 7x weekly  Seated Cervical Sidebending Stretch - 2 reps - 1 sets - 15 sec hold - 1x daily - 7x weekly  Seated Levator Scapulae Stretch - 2 reps - 1 sets - 15 sec hold - 1x daily - 7x weekly  Supine Cervical Flexion Extension on Pillow - 10 reps - 1 sets - 1x daily - 7x weekly  Supine Bilateral Shoulder External Rotation with Resistance - 10 reps - 1 sets - 5 sec hold - 1x daily - 7x weekly  Supine Alternating Shoulder Flexion - 10 reps - 1 sets - 1x daily - 7x weekly  Mid-Lower Cervical Extension SNAG with Strap - 5 reps - 1 sets - 2x daily - 7x weekly  Seated Assisted Cervical Rotation with Towel - 5 reps - 1 sets - 2x daily - 7x weekly  Patient Education  Office [facility] [address], [address] [place], [place] [address] Phone # [phone] Fax [phone]

## 2017-10-14 NOTE — Assessment & Plan Note (Signed)
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 Given sample of breo and rx sent in as well for short term use. Cough medicine is not working so sent in hycodan instead. [place] narcotic database reviewed and no inappropriate fills. Call back if not improved. No further steroids or antibiotics indicated.

## 2017-10-19 ENCOUNTER — Ambulatory Visit: Payer: Medicare Other | Admitting: Sports Medicine

## 2017-10-19 VITALS — BP 102/64 | Ht 66.5 in | Wt 170.0 lb

## 2017-10-19 DIAGNOSIS — M5030 Other cervical disc degeneration, unspecified cervical region: Secondary | ICD-10-CM

## 2017-10-19 NOTE — Progress Notes (Signed)
   Subjective:    Patient ID: [name], female    DOB: 04/11/1940, 77 y.o.   MRN: [id number]  HPI   Patient comes in today for follow-up on neck pain secondary to cervical degenerative disc disease and facet arthropathy. She has made excellent progress with physical therapy. Symptoms are minimal at this point in time. Her headaches have resolved. She is pleased with her progress to date.   Review of Systems As above    Objective:   Physical Exam  Well-developed, well-nourished. No acute distress. Awake alert and oriented 3. Vital signs reviewed  Cervical spine: Full cervical rotation to the left. Limited cervical rotation to the right eye about 30 degrees but no pain. Good flexion and extension. No tenderness to palpation. No spasm.  Previous x-rays showed multilevel degenerative disc disease and facet arthropathy      Assessment & Plan:   Improving neck pain secondary to cervical degenerative disc disease versus facet arthropathy  Patient will continue with formal physical therapy. She may advance to a home exercise program per the therapist's discretion. Given her overall improvement, we will hold on further workup or treatment at this point in time. Patient will follow-up with me as needed.

## 2017-10-20 ENCOUNTER — Ambulatory Visit: Payer: Medicare Other | Admitting: Physical Therapy

## 2017-10-20 DIAGNOSIS — M542 Cervicalgia: Secondary | ICD-10-CM | POA: Diagnosis not present

## 2017-10-20 DIAGNOSIS — M62838 Other muscle spasm: Secondary | ICD-10-CM

## 2017-10-20 NOTE — Therapy (Signed)
[facility] [address] [place], [place], [address] Phone: [phone]   Fax:  [phone]  Physical Therapy Treatment  Patient Details  Name: [name] MRN: [id number] Date of Birth: 08-10-1940 Referring Provider: Dr. Reino Bellis   Encounter Date: 10/20/2017  PT End of Session - 10/20/17 1404    Visit Number  6    Date for PT Re-Evaluation  11/26/17    Authorization Type  UHC    PT Start Time  1400    PT Stop Time  1440    PT Time Calculation (min)  40 min    Activity Tolerance  Patient tolerated treatment well    Behavior During Therapy  [facility] for tasks assessed/performed       Past Medical History:  Diagnosis Date  . Chronic hepatitis C (HCC) 2014   DID HARVONI AND RIBIVORIN [place]   . Headache    MIGRAINE AURA NO HEADACHE  . History of blood transfusion 1990   AFTER GI BLEED  . Hyperlipidemia   . Hypertension   . Multinodular goiter    SHRANK AWAY 25 YRS AGO  . Osteoarthritis    OA  . Palpitations    PACs & PVCs  . Pneumonia YRS AGO    Past Surgical History:  Procedure Laterality Date  . TOTAL KNEE ARTHROPLASTY Left 04/28/2016   Procedure: LEFT TOTAL KNEE ARTHROPLASTY;  Surgeon: Ollen Gross, MD;  Location: [facility];  Service: Orthopedics;  Laterality: Left;  . TUBAL LIGATION      There were no vitals filed for this visit.  Subjective Assessment - 10/20/17 1407    Subjective  My neck pain is Zimmerman better. I saw my doctor yesterday. My neck pain is 85% better.     Patient Stated Goals  learn exercises to reduce pain and mobility    Currently in Pain?  Yes    Pain Score  1     Pain Location  Neck    Pain Orientation  Right    Pain Descriptors / Indicators  Tightness;Tender    Pain Onset  More than a month ago    Pain Frequency  Intermittent    Aggravating Factors   turning head to the right    Pain Relieving Factors  neck stretches    Multiple Pain Sites  No                        [facility] Adult PT Treatment/Exercise - 10/20/17 0001      Neck Exercises: Machines for Strengthening   UBE (Upper Arm Bike)  3 min forward, 3 min backward level 1      Neck Exercises: Standing   Other Standing Exercises  standing with full trunk rotation 10 times  each way      Neck Exercises: Seated   Shoulder Shrugs  10 reps holding 4#    Shoulder ABduction  Both;10 reps;Weights overhead     Shoulder Abduction Weights (lbs)  1    Other Seated Exercise  3 ways 1 # 10x each      Neck Exercises: Supine   Other Supine Exercise  head on physioball- head retraction, alternate shoulder flexion 1# with head press, shoulder protraction, cervical rotation 10x bilateral      Manual Therapy   Manual Therapy  Soft tissue mobilization;Joint mobilization    Joint Mobilization  side glide to C2-C5, rotation mobilization to C2-C4    Soft tissue mobilization  cervical paraspinals, scalenes, suboccipitals, upper trap               PT Short Term Goals - 10/14/17 1410      PT SHORT TERM GOAL #3   Title  sleep with pain decreased >/= 25%    Time  4    Period  Weeks    Status  Achieved      PT SHORT TERM GOAL #4   Title  reduce headaches intensity and frequency >/= 25%    Time  4    Period  Weeks    Status  Achieved        PT Long Term Goals - 10/20/17 1409      PT LONG TERM GOAL #1   Title  understand correct body mechanics to  reduce the strain of her neck during daily tasks especially with the computer    Time  8    Period  Weeks    Status  Achieved      PT LONG TERM GOAL #2   Title  independent with HEP and understand how to progress herself    Baseline  still learning    Time  8    Period  Weeks    Status  On-going      PT LONG TERM GOAL #3   Title  reduce headaches with intensity and frequency >/= 70%    Time  8    Period  Weeks    Status  Achieved      PT LONG TERM GOAL #4   Title  increased cervical right rotation so patient  is able to look behind her to back up the car without difficulty    Baseline  more trouble to the right    Time  8    Period  Weeks    Status  On-going      PT LONG TERM GOAL #5   Title  sleep with pain decreased >/= 75% so she is not waking up in the middle of the night    Time  8    Period  Weeks    Status  Achieved            Plan - 10/20/17 1437    Clinical Impression Statement  Patient is 85% better.  She had decreased mobility of the C2-C4 on the right.  Patient is not having headaches.  Patient is concerned about the pain coming back.  Patient will benefit from skilled therapy to improve cervical motion and reduce pain to return to prior activity.     Rehab Potential  Excellent    Clinical Impairments Affecting Rehab Potential  None    PT Frequency  2x / week    PT Duration  8 weeks    PT Treatment/Interventions  Cryotherapy;Electrical Stimulation;Moist Heat;Iontophoresis 4mg /ml Dexamethasone;Ultrasound;Traction;Therapeutic activities;Therapeutic exercise;Neuromuscular re-education;Patient/family education;Manual techniques;Passive range of motion;Dry needling    PT Next Visit Plan  joint mobilization to cervical; soft tissue work;cervical throracis stabilization; possible discharge    PT Home Exercise Plan  Access Code: [email or web address]     Consulted and Agree with Plan of Care  Patient       Patient will benefit from skilled therapeutic intervention in order to improve the following deficits and impairments:  Pain, Increased fascial restricitons, Increased muscle spasms, Decreased range of motion, Decreased activity tolerance, Decreased endurance  Visit Diagnosis: Cervicalgia  Other muscle spasm     Problem List Patient Active Problem List   Diagnosis Date Noted  .  Recurrent UTI 09/18/2017  . Syncope 07/03/2013  . Hx of hepatitis C 07/09/2009  . NONTOXIC MULTINODULAR GOITER 07/09/2009  . Hyperlipidemia 07/09/2009  . OA (osteoarthritis) of knee 07/09/2009  .  SPONDYLOSIS, CERVICAL, WITH RADICULOPATHY 07/09/2009  . PALPITATIONS, RECURRENT 07/09/2009  . Cough 07/03/2009    Eulis Foster, PT 10/20/17 2:41 PM   [facility] [address] [place], [place], [address] Phone: [phone]   Fax:  [phone]  Name: [name] MRN: [id number] Date of Birth: [birth year]

## 2017-10-22 ENCOUNTER — Ambulatory Visit: Payer: Medicare Other | Attending: Sports Medicine | Admitting: Physical Therapy

## 2017-10-22 ENCOUNTER — Encounter: Payer: Self-pay | Admitting: Physical Therapy

## 2017-10-22 DIAGNOSIS — M62838 Other muscle spasm: Secondary | ICD-10-CM

## 2017-10-22 DIAGNOSIS — M542 Cervicalgia: Secondary | ICD-10-CM | POA: Insufficient documentation

## 2017-10-22 NOTE — Therapy (Signed)
[facility] [address] [place], [place], [address] Phone: [phone]   Fax:  [phone]  Physical Therapy Treatment  Patient Details  Name: [name] MRN: [id number] Date of Birth: 1940-09-23 Referring Provider: Dr. Lilia Argue   Encounter Date: 10/22/2017  PT End of Session - 10/22/17 1409    Visit Number  7    Date for PT Re-Evaluation  11/26/17    Authorization Type  UHC    PT Start Time  1400    PT Stop Time  1440    PT Time Calculation (min)  40 min    Activity Tolerance  Patient tolerated treatment well    Behavior During Therapy  [facility] for tasks assessed/performed       Past Medical History:  Diagnosis Date  . Chronic hepatitis C ([place]) 2014   DID HARVONI AND RIBIVORIN [place]   . Headache    MIGRAINE AURA NO HEADACHE  . History of blood transfusion 1990   AFTER GI BLEED  . Hyperlipidemia   . Hypertension   . Multinodular goiter    SHRANK AWAY 25 YRS AGO  . Osteoarthritis    OA  . Palpitations    PACs & PVCs  . Pneumonia YRS AGO    Past Surgical History:  Procedure Laterality Date  . TOTAL KNEE ARTHROPLASTY Left 04/28/2016   Procedure: LEFT TOTAL KNEE ARTHROPLASTY;  Surgeon: Gaynelle Arabian, MD;  Location: [facility];  Service: Orthopedics;  Laterality: Left;  . TUBAL LIGATION      There were no vitals filed for this visit.  Subjective Assessment - 10/22/17 1405    Subjective  I crawled on my belly under the house and my neck is not hurting.     Patient Stated Goals  learn exercises to reduce pain and mobility    Currently in Pain?  No/denies         [facility] PT Assessment - 10/22/17 0001      Assessment   Medical Diagnosis  M54.2 Neck Pain    Referring Provider  Dr. Lilia Argue    Onset Date/Surgical Date  04/24/17    Prior Therapy  past cervical pain      Precautions   Precautions  None      Restrictions   Weight Bearing Restrictions  No      Home Environment   Living  Environment  Private residence      Prior Function   Level of [place]  ride recumbent 3 times per week      Cognition   Overall Cognitive Status  Within Functional Limits for tasks assessed      Observation/Other Assessments   Focus on Therapeutic Outcomes (FOTO)   4% limitation      Posture/Postural Control   Posture/Postural Control  No significant limitations      AROM   Cervical Flexion  45    Cervical Extension  65    Cervical - Right Side Bend  40    Cervical - Left Side Bend  40    Cervical - Right Rotation  70    Cervical - Left Rotation  75                   [facility] Adult PT Treatment/Exercise - 10/22/17 0001      Neck Exercises: Machines for Strengthening   UBE (Upper Arm Bike)  3 min forward, 3 min backward level 1  Neck Exercises: Standing   Other Standing Exercises  standing with full trunk rotation 10 times  each way      Neck Exercises: Seated   Shoulder Shrugs  10 reps holding 4#    Shoulder Flexion  Both;10 reps;Weights to 90 degrees    Shoulder Flexion Weights (lbs)  1#    Shoulder ABduction  Both;10 reps;Weights overhead     Shoulder Abduction Weights (lbs)  1    Other Seated Exercise  3 ways 1 # 10x each      Neck Exercises: Supine   Other Supine Exercise  head on physioball- head retraction, alternate shoulder flexion 1# with head press, shoulder protraction, cervical rotation 10x bilateral      Manual Therapy   Manual Therapy  Soft tissue mobilization;Joint mobilization    Soft tissue mobilization  cervical paraspinals, scalenes, suboccipitals, upper trap               PT Short Term Goals - 10/14/17 1410      PT SHORT TERM GOAL #3   Title  sleep with pain decreased >/= 25%    Time  4    Period  Weeks    Status  Achieved      PT SHORT TERM GOAL #4   Title  reduce headaches intensity and frequency >/= 25%    Time  4    Period  Weeks    Status  Achieved        PT Long Term Goals -  10/22/17 1426      PT LONG TERM GOAL #1   Title  understand correct body mechanics to  reduce the strain of her neck during daily tasks especially with the computer    Time  8    Period  Weeks    Status  Achieved      PT LONG TERM GOAL #2   Title  independent with HEP and understand how to progress herself    Time  8    Period  Weeks    Status  Achieved      PT LONG TERM GOAL #3   Title  reduce headaches with intensity and frequency >/= 70%    Time  8    Period  Weeks    Status  Achieved      PT LONG TERM GOAL #4   Title  increased cervical right rotation so patient is able to look behind her to back up the car without difficulty    Time  8    Period  Weeks    Status  Achieved      PT LONG TERM GOAL #5   Title  sleep with pain decreased >/= 75% so she is not waking up in the middle of the night    Time  8    Period  Weeks    Status  Achieved            Plan - 10/22/17 1423    Clinical Impression Statement  Patient has met her goals.  Patient has full cervical. Patient reports no pain after crawling on her belly under the house. No trouble with neck pain with sleeping. No difficulty with walking her dogs. No headaches.  Patient is ready for discharge.     Rehab Potential  Excellent    Clinical Impairments Affecting Rehab Potential  None    PT Treatment/Interventions  Cryotherapy;Electrical Stimulation;Moist Heat;Iontophoresis 89m/ml Dexamethasone;Ultrasound;Traction;Therapeutic activities;Therapeutic exercise;Neuromuscular re-education;Patient/family education;Manual techniques;Passive range of motion;Dry needling  PT Next Visit Plan  Discharge to HEP this visit    PT Home Exercise Plan  Access Code: [email or web address]     Consulted and Agree with Plan of Care  Patient       Patient will benefit from skilled therapeutic intervention in order to improve the following deficits and impairments:  Pain, Increased fascial restricitons, Increased muscle spasms, Decreased range of  motion, Decreased activity tolerance, Decreased endurance  Visit Diagnosis: Cervicalgia  Other muscle spasm     Problem List Patient Active Problem List   Diagnosis Date Noted  . Recurrent UTI 09/18/2017  . Syncope 07/03/2013  . Hx of hepatitis C 07/09/2009  . NONTOXIC MULTINODULAR GOITER 07/09/2009  . Hyperlipidemia 07/09/2009  . OA (osteoarthritis) of knee 07/09/2009  . SPONDYLOSIS, CERVICAL, WITH RADICULOPATHY 07/09/2009  . PALPITATIONS, RECURRENT 07/09/2009  . Cough 07/03/2009    Earlie Counts, PT 10/22/17 2:37 PM   [facility] [address] [place], [place], [address] Phone: [phone]   Fax:  [phone]  Name: [name] MRN: [id number] Date of Birth: [birth year] PHYSICAL THERAPY DISCHARGE SUMMARY  Visits from Start of Care: 8  Current functional level related to goals / functional outcomes: See above.    Remaining deficits: See above   Education / Equipment: HEP Plan: Patient agrees to discharge.  Patient goals were met. Patient is being discharged due to meeting the stated rehab goals. Thank you for the referral. Earlie Counts, PT 10/22/17 2:37 PM   ?????

## 2017-10-30 ENCOUNTER — Other Ambulatory Visit: Payer: Self-pay | Admitting: Family Medicine

## 2017-11-02 NOTE — Telephone Encounter (Signed)
Hillard DankerElizabeth Crawford, MD patient.

## 2017-11-02 NOTE — Telephone Encounter (Signed)
Last refill Jimmey RalphParker, MD. Please advise

## 2018-03-09 NOTE — H&P (Signed)
TOTAL KNEE ADMISSION H&P  Patient is being admitted for right total knee arthroplasty.  Subjective:  Chief Complaint:right knee pain.  HPI: [name], 77 y.o. female, has a history of pain and functional disability in the right knee due to arthritis and has failed non-surgical conservative treatments for greater than 12 weeks to includecorticosteriod injections and activity modification.  Onset of symptoms was gradual, starting >10 years ago with gradually worsening course since that time. The patient noted no past surgery on the right knee(s).  Patient currently rates pain in the right knee(s) at 6 out of 10 with activity. Patient has worsening of pain with activity and weight bearing, crepitus and instability.  Patient has evidence of bone-on-bone osteoarthritis in the lateral compartment and near bone-on-bone in the patellofemoral compartment by imaging studies. There is no active infection.  Patient Active Problem List   Diagnosis Date Noted  . Recurrent UTI 09/18/2017  . Syncope 07/03/2013  . Hx of hepatitis C 07/09/2009  . NONTOXIC MULTINODULAR GOITER 07/09/2009  . Hyperlipidemia 07/09/2009  . OA (osteoarthritis) of knee 07/09/2009  . SPONDYLOSIS, CERVICAL, WITH RADICULOPATHY 07/09/2009  . PALPITATIONS, RECURRENT 07/09/2009  . Cough 07/03/2009   Past Medical History:  Diagnosis Date  . Chronic hepatitis C (HCC) 2014   DID HARVONI AND RIBIVORIN TX   . Headache    MIGRAINE AURA NO HEADACHE  . History of blood transfusion 1990   AFTER GI BLEED  . Hyperlipidemia   . Hypertension   . Multinodular goiter    SHRANK AWAY 25 YRS AGO  . Osteoarthritis    OA  . Palpitations    PACs & PVCs  . Pneumonia YRS AGO    Past Surgical History:  Procedure Laterality Date  . TOTAL KNEE ARTHROPLASTY Left 04/28/2016   Procedure: LEFT TOTAL KNEE ARTHROPLASTY;  Surgeon: Ollen Gross, MD;  Location: [facility];  Service: Orthopedics;  Laterality: Left;  . TUBAL LIGATION      No current  facility-administered medications for this encounter.    Current Outpatient Medications  Medication Sig Dispense Refill Last Dose  . amLODipine (NORVASC) 2.5 MG tablet Take 2.5 mg by mouth daily.   Taking  . azithromycin (ZITHROMAX) 250 MG tablet Take 2 tabs day 1, then 1 tab daily (Patient not taking: Reported on 10/19/2017) 6 each 0 Not Taking  . cholecalciferol (VITAMIN D) 1000 units tablet Vitamin D   Taking  . conjugated estrogens (PREMARIN) vaginal cream Place 1 Applicatorful vaginally daily. 42.5 g 12 Taking  . fluticasone furoate-vilanterol (BREO ELLIPTA) 100-25 MCG/INH AEPB Inhale 1 puff into the lungs daily. 30 each 3 Taking  . guaiFENesin-codeine 100-10 MG/5ML syrup Take 5 mLs by mouth 3 (three) times daily as needed for cough. 120 mL 0 Taking  . HYDROcodone-homatropine (HYCODAN) 5-1.5 MG/5ML syrup Take 5 mLs by mouth every 8 (eight) hours as needed for cough. (Patient not taking: Reported on 10/19/2017) 75 mL 0 Not Taking  . ibuprofen (ADVIL,MOTRIN) 200 MG tablet ibuprofen 200 mg tablet   2 tablets as needed by oral route.   Taking  . levothyroxine (SYNTHROID, LEVOTHROID) 50 MCG tablet Take 50 mcg by mouth daily before breakfast.    Taking  . metoprolol succinate (TOPROL-XL) 50 MG 24 hr tablet Take 50 mg by mouth daily.    Taking  . pravastatin (PRAVACHOL) 20 MG tablet Take 20 mg by mouth at bedtime.    Taking  . PROAIR HFA 108 (90 Base) MCG/ACT inhaler INHALE 1-2 PUFFS INTO THE LUNGS EVERY  4 (FOUR) HOURS AS NEEDED FOR WHEEZING OR SHORTNESS OF BREATH. 8.5 Inhaler 0    Allergies  Allergen Reactions  . Meperidine Hcl Other (See Comments)    URTICARIA   . Nitrofurantoin Other (See Comments)    FLU Like symptoms   . Griseofulvin Rash  . Penicillins Rash    30 years ago and childhood    Social History   Tobacco Use  . Smoking status: Never Smoker  . Smokeless tobacco: Never Used  Substance Use Topics  . Alcohol use: Yes    Comment: 1 GLASS WINE PER DAY    Family History    Problem Relation Age of Onset  . Heart disease Mother   . Heart disease Father   . COPD Father      Review of Systems  Constitutional: Negative for chills and fever.  HENT: Negative for congestion, sore throat and tinnitus.   Eyes: Negative for double vision, photophobia and pain.  Respiratory: Negative for cough, shortness of breath and wheezing.   Cardiovascular: Negative for chest pain, palpitations and orthopnea.  Gastrointestinal: Negative for heartburn, nausea and vomiting.  Genitourinary: Negative for dysuria, frequency and urgency.  Musculoskeletal: Positive for joint pain.  Neurological: Negative for dizziness, weakness and headaches.    Objective:  Physical Exam  Well nourished and well developed.  General: Alert and oriented x3, cooperative and pleasant, no acute distress.  Head: normocephalic, atraumatic, neck supple.  Eyes: EOMI.  Respiratory: breath sounds clear in all fields, no wheezing, rales, or rhonchi. Cardiovascular: Regular rate and rhythm, no murmurs, gallops or rubs.  Abdomen: non-tender to palpation and soft, normoactive bowel sounds. Musculoskeletal: Right Knee Exam: No effusion, mild swelling. AROM 0-110 degrees. No tenderness to palpation about the medial or lateral joint line. Valgus deformity. No instability. Mild patellofemoral crepitus. Calves soft and nontender. Motor function intact in LE. Strength 5/5 LE bilaterally. Neuro: Distal pulses 2+. Sensation to light touch intact in LE.  Vital signs in last 24 hours: Blood pressure: 128/84 mmHg Pulse: 68 bpm  Labs:   Estimated body mass index is 27.03 kg/m as calculated from the following:   Height as of 10/19/17: 5' 6.5" (1.689 m).   Weight as of 10/19/17: 77.1 kg.   Imaging Review Plain radiographs demonstrate severe degenerative joint disease of the right knee(s). The overall alignment issignificant valgus. The bone quality appears to be adequate for age and reported activity  level.   Preoperative templating of the joint replacement has been completed, documented, and submitted to the Operating Room personnel in order to optimize intra-operative equipment management.   Anticipated LOS equal to or greater than 2 midnights due to - Age 75 and older with one or more of the following:  - Obesity  - Expected need for hospital services (PT, OT, Nursing) required for safe  discharge  - Anticipated need for postoperative skilled nursing care or inpatient rehab  - Active co-morbidities: None OR   - Unanticipated findings during/Post Surgery: None  - Patient is a high risk of re-admission due to: None     Assessment/Plan:  End stage arthritis, right knee   The patient history, physical examination, clinical judgment of the provider and imaging studies are consistent with end stage degenerative joint disease of the right knee(s) and total knee arthroplasty is deemed medically necessary. The treatment options including medical management, injection therapy arthroscopy and arthroplasty were discussed at length. The risks and benefits of total knee arthroplasty were presented and reviewed. The risks due to  aseptic loosening, infection, stiffness, patella tracking problems, thromboembolic complications and other imponderables were discussed. The patient acknowledged the explanation, agreed to proceed with the plan and consent was signed. Patient is being admitted for inpatient treatment for surgery, pain control, PT, OT, prophylactic antibiotics, VTE prophylaxis, progressive ambulation and ADL's and discharge planning. The patient is planning to be discharged home.   Therapy Plans: outpatient therapy at [facility] Disposition: Home with husband Planned DVT Prophylaxis: aspirin 325mg  BID DME needed: None PCP: Hillard Danker, MD TXA: IV Allergies: PCN (rash) Anesthesia Concerns: None BMI: 27  - Patient was instructed on what medications to stop prior to  surgery. - Follow-up visit in 2 weeks with Dr. Lequita Halt - Begin physical therapy following surgery - Pre-operative lab work as pre-surgical testing - Prescriptions will be provided in hospital at time of discharge  Arther Abbott, PA-C Orthopedic Surgery [facility] [place]

## 2018-03-15 ENCOUNTER — Other Ambulatory Visit: Payer: Self-pay

## 2018-03-15 MED ORDER — AMLODIPINE BESYLATE 2.5 MG PO TABS: 2.5000 mg | ORAL_TABLET | Freq: Every day | ORAL | 3 refills | Status: DC

## 2018-03-23 NOTE — Progress Notes (Signed)
12-29-17 Surgical Clearance from Dr. Okey Duprerawford on chart.

## 2018-03-23 NOTE — Patient Instructions (Addendum)
[name]  03/23/2018   Your procedure is scheduled on: 03-29-18    [facility]    Report to Admitting at 5:50 AM    Call this number if you have problems the morning of surgery [phone]    Remember: Do not eat food or drink liquids :After Midnight. BRUSH YOUR TEETH MORNING OF SURGERY AND RINSE YOUR MOUTH OUT, NO CHEWING GUM CANDY OR MINTS.     Take these medicines the morning of surgery with A SIP OF WATER: Amlodipine (Norvasc), Levothyroxine (Synthroid), and Metoprolol  You may also use and bring your inhaler, as needed.                               You may not have any metal on your body including hair pins and              piercings  Do not wear jewelry, make-up, lotions, powders or perfumes, deodorant             Do not wear nail polish.  Do not shave  48 hours prior to surgery.                Do not bring valuables to the hospital. [facility] IS NOT             RESPONSIBLE   FOR VALUABLES.  Contacts, dentures or bridgework may not be worn into surgery.  Leave suitcase in the car. After surgery it may be brought to your room.    Special Instructions: N/A              Please read over the following fact sheets you were given: _____________________________________________________________________             [facility] Pre[facility] Before surgery, you can play an important role.  Because skin is not sterile, your skin needs to be as free of germs as possible.  You can reduce the number of germs on your skin by washing with CHG (chlorahexidine gluconate) soap before surgery.  CHG is an antiseptic cleaner which kills germs and bonds with the skin to continue killing germs even after washing. Please DO NOT use if you have an allergy to CHG or antibacterial soaps.  If your skin becomes reddened/irritated stop using the CHG and inform your nurse when you arrive at Short Stay. Do not shave (including legs and  underarms) for at least 48 hours prior to the first CHG shower.  You may shave your face/neck. Please follow these instructions carefully:  1.  Shower with CHG Soap the night before surgery and the  morning of Surgery.  2.  If you choose to wash your hair, wash your hair first as usual with your  normal  shampoo.  3.  After you shampoo, rinse your hair and body thoroughly to remove the  shampoo.                           4.  Use CHG as you would any other liquid soap.  You can apply chg directly  to the skin and wash                       Gently with a scrungie or clean  washcloth.  5.  Apply the CHG Soap to your body ONLY FROM THE NECK DOWN.   Do not use on face/ open                           Wound or open sores. Avoid contact with eyes, ears mouth and genitals (private parts).                       Wash face,  Genitals (private parts) with your normal soap.             6.  Wash thoroughly, paying special attention to the area where your surgery  will be performed.  7.  Thoroughly rinse your body with warm water from the neck down.  8.  DO NOT shower/wash with your normal soap after using and rinsing off  the CHG Soap.                9.  Pat yourself dry with a clean towel.            10.  Wear clean pajamas.            11.  Place clean sheets on your bed the night of your first shower and do not  sleep with pets. Day of Surgery : Do not apply any lotions/deodorants the morning of surgery.  Please wear clean clothes to the hospital/surgery center.  FAILURE TO FOLLOW THESE INSTRUCTIONS MAY RESULT IN THE CANCELLATION OF YOUR SURGERY PATIENT SIGNATURE_________________________________  NURSE SIGNATURE__________________________________  ________________________________________________________________________   Adam Phenix  An incentive spirometer is a tool that can help keep your lungs clear and active. This tool measures how well you are filling your lungs with each breath. Taking  long deep breaths may help reverse or decrease the chance of developing breathing (pulmonary) problems (especially infection) following:  A long period of time when you are unable to move or be active. BEFORE THE PROCEDURE   If the spirometer includes an indicator to show your best effort, your nurse or respiratory therapist will set it to a desired goal.  If possible, sit up straight or lean slightly forward. Try not to slouch.  Hold the incentive spirometer in an upright position. INSTRUCTIONS FOR USE  1. Sit on the edge of your bed if possible, or sit up as far as you can in bed or on a chair. 2. Hold the incentive spirometer in an upright position. 3. Breathe out normally. 4. Place the mouthpiece in your mouth and seal your lips tightly around it. 5. Breathe in slowly and as deeply as possible, raising the piston or the ball toward the top of the column. 6. Hold your breath for 3-5 seconds or for as long as possible. Allow the piston or ball to fall to the bottom of the column. 7. Remove the mouthpiece from your mouth and breathe out normally. 8. Rest for a few seconds and repeat Steps 1 through 7 at least 10 times every 1-2 hours when you are awake. Take your time and take a few normal breaths between deep breaths. 9. The spirometer may include an indicator to show your best effort. Use the indicator as a goal to work toward during each repetition. 10. After each set of 10 deep breaths, practice coughing to be sure your lungs are clear. If you have an incision (the cut made at the time of surgery), support your incision when coughing by  placing a pillow or rolled up towels firmly against it. Once you are able to get out of bed, walk around indoors and cough well. You may stop using the incentive spirometer when instructed by your caregiver.  RISKS AND COMPLICATIONS  Take your time so you do not get dizzy or light-headed.  If you are in pain, you may need to take or ask for pain  medication before doing incentive spirometry. It is harder to take a deep breath if you are having pain. AFTER USE  Rest and breathe slowly and easily.  It can be helpful to keep track of a log of your progress. Your caregiver can provide you with a simple table to help with this. If you are using the spirometer at home, follow these instructions: [place] IF:   You are having difficultly using the spirometer.  You have trouble using the spirometer as often as instructed.  Your pain medication is not giving enough relief while using the spirometer.  You develop fever of 100.5 F (38.1 C) or higher. SEEK IMMEDIATE MEDICAL CARE IF:   You cough up bloody sputum that had not been present before.  You develop fever of 102 F (38.9 C) or greater.  You develop worsening pain at or near the incision site. MAKE SURE YOU:   Understand these instructions.  Will watch your condition.  Will get help right away if you are not doing well or get worse. Document Released: 07/21/2006 Document Revised: 06/02/2011 Document Reviewed: 09/21/2006 Exit[employer] Patient Information 2014 [employer], [place].   ________________________________________________________________________  WHAT IS A BLOOD TRANSFUSION? Blood Transfusion Information  A transfusion is the replacement of blood or some of its parts. Blood is made up of multiple cells which provide different functions.  Red blood cells carry oxygen and are used for blood loss replacement.  White blood cells fight against infection.  Platelets control bleeding.  Plasma helps clot blood.  Other blood products are available for specialized needs, such as hemophilia or other clotting disorders. BEFORE THE TRANSFUSION  Who gives blood for transfusions?   Healthy volunteers who are fully evaluated to make sure their blood is safe. This is blood bank blood. Transfusion therapy is the safest it has ever been in the practice of medicine.  Before blood is taken from a donor, a complete history is taken to make sure that person has no history of diseases nor engages in risky social behavior (examples are intravenous drug use or sexual activity with multiple partners). The donor's travel history is screened to minimize risk of transmitting infections, such as malaria. The donated blood is tested for signs of infectious diseases, such as HIV and hepatitis. The blood is then tested to be sure it is compatible with you in order to minimize the chance of a transfusion reaction. If you or a relative donates blood, this is often done in anticipation of surgery and is not appropriate for emergency situations. It takes many days to process the donated blood. RISKS AND COMPLICATIONS Although transfusion therapy is very safe and saves many lives, the main dangers of transfusion include:   Getting an infectious disease.  Developing a transfusion reaction. This is an allergic reaction to something in the blood you were given. Every precaution is taken to prevent this. The decision to have a blood transfusion has been considered carefully by your caregiver before blood is given. Blood is not given unless the benefits outweigh the risks. AFTER THE TRANSFUSION  Right after receiving a blood  transfusion, you will usually feel much better and more energetic. This is especially true if your red blood cells have gotten low (anemic). The transfusion raises the level of the red blood cells which carry oxygen, and this usually causes an energy increase.  The nurse administering the transfusion will monitor you carefully for complications. HOME CARE INSTRUCTIONS  No special instructions are needed after a transfusion. You may find your energy is better. Speak with your caregiver about any limitations on activity for underlying diseases you may have. SEEK MEDICAL CARE IF:   Your condition is not improving after your transfusion.  You develop redness or  irritation at the intravenous (IV) site. SEEK IMMEDIATE MEDICAL CARE IF:  Any of the following symptoms occur over the next 12 hours:  Shaking chills.  You have a temperature by mouth above 102 F (38.9 C), not controlled by medicine.  Chest, back, or muscle pain.  People around you feel you are not acting correctly or are confused.  Shortness of breath or difficulty breathing.  Dizziness and fainting.  You get a rash or develop hives.  You have a decrease in urine output.  Your urine turns a dark color or changes to pink, red, or brown. Any of the following symptoms occur over the next 10 days:  You have a temperature by mouth above 102 F (38.9 C), not controlled by medicine.  Shortness of breath.  Weakness after normal activity.  The white part of the eye turns yellow (jaundice).  You have a decrease in the amount of urine or are urinating less often.  Your urine turns a dark color or changes to pink, red, or brown. Document Released: 03/07/2000 Document Revised: 06/02/2011 Document Reviewed: 10/25/2007 [facility] Patient Information 2014 [place], [place].  _______________________________________________________________________

## 2018-03-26 ENCOUNTER — Encounter (HOSPITAL_COMMUNITY)
Admission: RE | Admit: 2018-03-26 | Discharge: 2018-03-26 | Disposition: A | Discharge status: Home / Self Care | Payer: Medicare Other | Source: Ambulatory Visit | Attending: Orthopedic Surgery | Admitting: Orthopedic Surgery

## 2018-03-26 ENCOUNTER — Encounter (HOSPITAL_COMMUNITY): Payer: Self-pay

## 2018-03-26 ENCOUNTER — Other Ambulatory Visit: Payer: Self-pay

## 2018-03-26 DIAGNOSIS — M1711 Unilateral primary osteoarthritis, right knee: Secondary | ICD-10-CM

## 2018-03-26 DIAGNOSIS — R9431 Abnormal electrocardiogram [ECG] [EKG]: Secondary | ICD-10-CM

## 2018-03-26 DIAGNOSIS — I10 Essential (primary) hypertension: Secondary | ICD-10-CM

## 2018-03-26 DIAGNOSIS — E785 Hyperlipidemia, unspecified: Secondary | ICD-10-CM | POA: Diagnosis not present

## 2018-03-26 DIAGNOSIS — Z01818 Encounter for other preprocedural examination: Secondary | ICD-10-CM | POA: Insufficient documentation

## 2018-03-26 DIAGNOSIS — B182 Chronic viral hepatitis C: Secondary | ICD-10-CM | POA: Diagnosis not present

## 2018-03-26 DIAGNOSIS — Z885 Allergy status to narcotic agent status: Secondary | ICD-10-CM | POA: Diagnosis not present

## 2018-03-26 DIAGNOSIS — Z79899 Other long term (current) drug therapy: Secondary | ICD-10-CM | POA: Diagnosis not present

## 2018-03-26 DIAGNOSIS — I491 Atrial premature depolarization: Secondary | ICD-10-CM

## 2018-03-26 DIAGNOSIS — Z791 Long term (current) use of non-steroidal anti-inflammatories (NSAID): Secondary | ICD-10-CM | POA: Diagnosis not present

## 2018-03-26 LAB — CBC
HCT: 41.1 % (ref 36.0–46.0)
Hemoglobin: 12.9 g/dL (ref 12.0–15.0)
MCH: 29.1 pg (ref 26.0–34.0)
MCHC: 31.4 g/dL (ref 30.0–36.0)
MCV: 92.6 fL (ref 80.0–100.0)
Platelets: 168 10*3/uL (ref 150–400)
RBC: 4.44 MIL/uL (ref 3.87–5.11)
RDW: 12.8 % (ref 11.5–15.5)
WBC: 4.1 10*3/uL (ref 4.0–10.5)
nRBC: 0 % (ref 0.0–0.2)

## 2018-03-26 LAB — SURGICAL PCR SCREEN
MRSA, PCR: NEGATIVE
Staphylococcus aureus: NEGATIVE

## 2018-03-26 LAB — COMPREHENSIVE METABOLIC PANEL
ALT: 17 U/L (ref 0–44)
AST: 22 U/L (ref 15–41)
Albumin: 4.2 g/dL (ref 3.5–5.0)
Alkaline Phosphatase: 75 U/L (ref 38–126)
Anion gap: 10 (ref 5–15)
BUN: 17 mg/dL (ref 8–23)
CO2: 25 mmol/L (ref 22–32)
Calcium: 9.3 mg/dL (ref 8.9–10.3)
Chloride: 106 mmol/L (ref 98–111)
Creatinine, Ser: 0.62 mg/dL (ref 0.44–1.00)
GFR calc Af Amer: >60 mL/min (ref >60)
Glucose, Bld: 96 mg/dL (ref 70–99)
POTASSIUM: 3.8 mmol/L (ref 3.5–5.1)
Sodium: 141 mmol/L (ref 135–145)
Total Bilirubin: 0.9 mg/dL (ref 0.3–1.2)
Total Protein: 6.5 g/dL (ref 6.5–8.1)

## 2018-03-26 LAB — APTT: aPTT: 38 seconds — ABNORMAL HIGH (ref 24–36)

## 2018-03-26 LAB — PROTIME-INR
INR: 0.94
PROTHROMBIN TIME: 12.5 s (ref 11.4–15.2)

## 2018-03-26 NOTE — Progress Notes (Signed)
EKG reviewed with Shanda Bumps, [place].No new order

## 2018-03-28 MED ORDER — BUPIVACAINE LIPOSOME 1.3 % IJ SUSP
20.0000 mL | Freq: Once | INTRAMUSCULAR | Status: DC
  Filled 2018-03-28: qty 20

## 2018-03-29 ENCOUNTER — Ambulatory Visit (HOSPITAL_COMMUNITY): Payer: Medicare Other | Admitting: Physician Assistant

## 2018-03-29 ENCOUNTER — Encounter (HOSPITAL_COMMUNITY): Payer: Self-pay | Admitting: Anesthesiology

## 2018-03-29 ENCOUNTER — Other Ambulatory Visit: Payer: Self-pay

## 2018-03-29 ENCOUNTER — Ambulatory Visit (HOSPITAL_COMMUNITY)
Admission: RE | Admit: 2018-03-29 | Discharge: 2018-03-30 | Disposition: A | Discharge status: Home / Self Care | Payer: Medicare Other | Attending: Orthopedic Surgery | Admitting: Orthopedic Surgery

## 2018-03-29 ENCOUNTER — Encounter (HOSPITAL_COMMUNITY)
Admission: RE | Disposition: A | Discharge status: Home / Self Care | Payer: Self-pay | Source: Home / Self Care | Attending: Orthopedic Surgery

## 2018-03-29 ENCOUNTER — Ambulatory Visit (HOSPITAL_COMMUNITY): Payer: Medicare Other | Admitting: Anesthesiology

## 2018-03-29 DIAGNOSIS — M1711 Unilateral primary osteoarthritis, right knee: Secondary | ICD-10-CM | POA: Diagnosis not present

## 2018-03-29 DIAGNOSIS — M1712 Unilateral primary osteoarthritis, left knee: Secondary | ICD-10-CM

## 2018-03-29 DIAGNOSIS — Z79899 Other long term (current) drug therapy: Secondary | ICD-10-CM | POA: Insufficient documentation

## 2018-03-29 DIAGNOSIS — B182 Chronic viral hepatitis C: Secondary | ICD-10-CM | POA: Insufficient documentation

## 2018-03-29 DIAGNOSIS — I10 Essential (primary) hypertension: Secondary | ICD-10-CM | POA: Diagnosis not present

## 2018-03-29 DIAGNOSIS — Z791 Long term (current) use of non-steroidal anti-inflammatories (NSAID): Secondary | ICD-10-CM | POA: Insufficient documentation

## 2018-03-29 DIAGNOSIS — M179 Osteoarthritis of knee, unspecified: Secondary | ICD-10-CM | POA: Diagnosis present

## 2018-03-29 DIAGNOSIS — Z885 Allergy status to narcotic agent status: Secondary | ICD-10-CM | POA: Insufficient documentation

## 2018-03-29 DIAGNOSIS — E785 Hyperlipidemia, unspecified: Secondary | ICD-10-CM | POA: Diagnosis not present

## 2018-03-29 DIAGNOSIS — M1710 Unilateral primary osteoarthritis, unspecified knee: Secondary | ICD-10-CM | POA: Diagnosis present

## 2018-03-29 HISTORY — PX: TOTAL KNEE ARTHROPLASTY: SHX125

## 2018-03-29 LAB — TYPE AND SCREEN
ABO/RH(D): O POS
Antibody Screen: NEGATIVE

## 2018-03-29 SURGERY — ARTHROPLASTY, KNEE, TOTAL
Anesthesia: Spinal | Site: Knee | Laterality: Right

## 2018-03-29 MED ORDER — ONDANSETRON HCL 4 MG PO TABS: 4.0000 mg | ORAL_TABLET | Freq: Four times a day (QID) | ORAL | Status: DC | PRN

## 2018-03-29 MED ORDER — SODIUM CHLORIDE (PF) 0.9 % IJ SOLN
INTRAMUSCULAR | Status: DC | PRN
  Administered 2018-03-29: 60 mL

## 2018-03-29 MED ORDER — ROPIVACAINE HCL 7.5 MG/ML IJ SOLN
INTRAMUSCULAR | Status: DC | PRN
  Administered 2018-03-29: 20 mL via PERINEURAL

## 2018-03-29 MED ORDER — BUPIVACAINE IN DEXTROSE 0.75-8.25 % IT SOLN
INTRATHECAL | Status: DC | PRN
  Administered 2018-03-29: 1.8 mL via INTRATHECAL

## 2018-03-29 MED ORDER — MORPHINE SULFATE (PF) 2 MG/ML IV SOLN: 1.0000 mg | INTRAVENOUS | Status: DC | PRN

## 2018-03-29 MED ORDER — CHLORHEXIDINE GLUCONATE 4 % EX LIQD: 60.0000 mL | Freq: Once | CUTANEOUS | Status: DC

## 2018-03-29 MED ORDER — METOPROLOL SUCCINATE ER 50 MG PO TB24
50.0000 mg | ORAL_TABLET | Freq: Every day | ORAL | Status: DC
  Administered 2018-03-30: 50 mg via ORAL
  Filled 2018-03-29: qty 1

## 2018-03-29 MED ORDER — DEXAMETHASONE SODIUM PHOSPHATE 10 MG/ML IJ SOLN
INTRAMUSCULAR | Status: AC
  Filled 2018-03-29: qty 3

## 2018-03-29 MED ORDER — SODIUM CHLORIDE (PF) 0.9 % IJ SOLN
INTRAMUSCULAR | Status: DC | PRN
  Administered 2018-03-29: 1000 mL

## 2018-03-29 MED ORDER — ONDANSETRON HCL 4 MG/2ML IJ SOLN
INTRAMUSCULAR | Status: AC
  Filled 2018-03-29: qty 6

## 2018-03-29 MED ORDER — MIDAZOLAM HCL 2 MG/2ML IJ SOLN
INTRAMUSCULAR | Status: AC
  Filled 2018-03-29: qty 2

## 2018-03-29 MED ORDER — AMLODIPINE BESYLATE 5 MG PO TABS
2.5000 mg | ORAL_TABLET | Freq: Every day | ORAL | Status: DC
  Filled 2018-03-29: qty 1

## 2018-03-29 MED ORDER — METOCLOPRAMIDE HCL 5 MG/ML IJ SOLN: 5.0000 mg | Freq: Three times a day (TID) | INTRAMUSCULAR | Status: DC | PRN

## 2018-03-29 MED ORDER — ACETAMINOPHEN 500 MG PO TABS
1000.0000 mg | ORAL_TABLET | Freq: Four times a day (QID) | ORAL | Status: AC
  Administered 2018-03-29 – 2018-03-30 (×4): 1000 mg via ORAL
  Filled 2018-03-29 (×4): qty 2

## 2018-03-29 MED ORDER — FENTANYL CITRATE (PF) 100 MCG/2ML IJ SOLN
INTRAMUSCULAR | Status: DC | PRN
  Administered 2018-03-29: 25 ug via INTRAVENOUS

## 2018-03-29 MED ORDER — ONDANSETRON HCL 4 MG/2ML IJ SOLN: 4.0000 mg | Freq: Four times a day (QID) | INTRAMUSCULAR | Status: DC | PRN

## 2018-03-29 MED ORDER — BUPIVACAINE LIPOSOME 1.3 % IJ SUSP
INTRAMUSCULAR | Status: DC | PRN
  Administered 2018-03-29: 20 mL

## 2018-03-29 MED ORDER — STERILE WATER FOR IRRIGATION IR SOLN
Status: DC | PRN
  Administered 2018-03-29: 2000 mL

## 2018-03-29 MED ORDER — ASPIRIN EC 325 MG PO TBEC
325.0000 mg | DELAYED_RELEASE_TABLET | Freq: Two times a day (BID) | ORAL | Status: DC
  Administered 2018-03-30: 325 mg via ORAL
  Filled 2018-03-29: qty 1

## 2018-03-29 MED ORDER — LIDOCAINE 2% (20 MG/ML) 5 ML SYRINGE
INTRAMUSCULAR | Status: DC | PRN
  Administered 2018-03-29: 60 mg via INTRAVENOUS

## 2018-03-29 MED ORDER — MIDAZOLAM HCL 5 MG/5ML IJ SOLN
INTRAMUSCULAR | Status: DC | PRN
  Administered 2018-03-29: 1 mg via INTRAVENOUS

## 2018-03-29 MED ORDER — TRANEXAMIC ACID-NACL 1000-0.7 MG/100ML-% IV SOLN
1000.0000 mg | INTRAVENOUS | Status: AC
  Administered 2018-03-29: 1000 mg via INTRAVENOUS
  Filled 2018-03-29: qty 100

## 2018-03-29 MED ORDER — FLEET ENEMA 7-19 GM/118ML RE ENEM: 1.0000 | ENEMA | Freq: Once | RECTAL | Status: DC | PRN

## 2018-03-29 MED ORDER — PROMETHAZINE HCL 25 MG/ML IJ SOLN: 6.2500 mg | INTRAMUSCULAR | Status: DC | PRN

## 2018-03-29 MED ORDER — VANCOMYCIN HCL IN DEXTROSE 1-5 GM/200ML-% IV SOLN
1000.0000 mg | Freq: Two times a day (BID) | INTRAVENOUS | Status: AC
  Administered 2018-03-29: 1000 mg via INTRAVENOUS
  Filled 2018-03-29: qty 200

## 2018-03-29 MED ORDER — EPHEDRINE SULFATE-NACL 50-0.9 MG/10ML-% IV SOSY
PREFILLED_SYRINGE | INTRAVENOUS | Status: DC | PRN
  Administered 2018-03-29 (×2): 5 mg via INTRAVENOUS
  Administered 2018-03-29: 10 mg via INTRAVENOUS
  Administered 2018-03-29 (×6): 5 mg via INTRAVENOUS

## 2018-03-29 MED ORDER — PHENYLEPHRINE 40 MCG/ML (10ML) SYRINGE FOR IV PUSH (FOR BLOOD PRESSURE SUPPORT)
PREFILLED_SYRINGE | INTRAVENOUS | Status: DC | PRN
  Administered 2018-03-29 (×3): 80 ug via INTRAVENOUS

## 2018-03-29 MED ORDER — POLYETHYLENE GLYCOL 3350 17 G PO PACK: 17.0000 g | PACK | Freq: Every day | ORAL | Status: DC | PRN

## 2018-03-29 MED ORDER — DEXAMETHASONE SODIUM PHOSPHATE 10 MG/ML IJ SOLN
10.0000 mg | Freq: Once | INTRAMUSCULAR | Status: AC
  Administered 2018-03-30: 10 mg via INTRAVENOUS
  Filled 2018-03-29: qty 1

## 2018-03-29 MED ORDER — SODIUM CHLORIDE (PF) 0.9 % IJ SOLN
INTRAMUSCULAR | Status: AC
  Filled 2018-03-29: qty 10

## 2018-03-29 MED ORDER — FENTANYL CITRATE (PF) 100 MCG/2ML IJ SOLN
50.0000 ug | INTRAMUSCULAR | Status: DC
  Administered 2018-03-29: 50 ug via INTRAVENOUS
  Filled 2018-03-29: qty 2

## 2018-03-29 MED ORDER — GABAPENTIN 300 MG PO CAPS
300.0000 mg | ORAL_CAPSULE | Freq: Once | ORAL | Status: AC
  Administered 2018-03-29: 300 mg via ORAL
  Filled 2018-03-29: qty 1

## 2018-03-29 MED ORDER — DIPHENHYDRAMINE HCL 12.5 MG/5ML PO ELIX: 12.5000 mg | ORAL_SOLUTION | ORAL | Status: DC | PRN

## 2018-03-29 MED ORDER — LEVOTHYROXINE SODIUM 50 MCG PO TABS
50.0000 ug | ORAL_TABLET | Freq: Every day | ORAL | Status: DC
  Administered 2018-03-30: 50 ug via ORAL
  Filled 2018-03-29: qty 1

## 2018-03-29 MED ORDER — BISACODYL 10 MG RE SUPP: 10.0000 mg | Freq: Every day | RECTAL | Status: DC | PRN

## 2018-03-29 MED ORDER — METHOCARBAMOL 500 MG IVPB - SIMPLE MED
INTRAVENOUS | Status: AC
  Filled 2018-03-29: qty 50

## 2018-03-29 MED ORDER — SODIUM CHLORIDE (PF) 0.9 % IJ SOLN
INTRAMUSCULAR | Status: AC
  Filled 2018-03-29: qty 50

## 2018-03-29 MED ORDER — METHOCARBAMOL 500 MG IVPB - SIMPLE MED
500.0000 mg | Freq: Four times a day (QID) | INTRAVENOUS | Status: DC | PRN
  Administered 2018-03-29: 500 mg via INTRAVENOUS
  Filled 2018-03-29: qty 50

## 2018-03-29 MED ORDER — DEXAMETHASONE SODIUM PHOSPHATE 10 MG/ML IJ SOLN
8.0000 mg | Freq: Once | INTRAMUSCULAR | Status: AC
  Administered 2018-03-29: 8 mg via INTRAVENOUS

## 2018-03-29 MED ORDER — TRAMADOL HCL 50 MG PO TABS
50.0000 mg | ORAL_TABLET | Freq: Four times a day (QID) | ORAL | Status: DC | PRN
  Administered 2018-03-30: 50 mg via ORAL
  Filled 2018-03-29: qty 2
  Filled 2018-03-29: qty 1

## 2018-03-29 MED ORDER — CEFAZOLIN SODIUM-DEXTROSE 2-4 GM/100ML-% IV SOLN
2.0000 g | INTRAVENOUS | Status: AC
  Administered 2018-03-29: 2 g via INTRAVENOUS
  Filled 2018-03-29: qty 100

## 2018-03-29 MED ORDER — LIDOCAINE 2% (20 MG/ML) 5 ML SYRINGE
INTRAMUSCULAR | Status: AC
  Filled 2018-03-29: qty 5

## 2018-03-29 MED ORDER — TRANEXAMIC ACID-NACL 1000-0.7 MG/100ML-% IV SOLN
1000.0000 mg | Freq: Once | INTRAVENOUS | Status: AC
  Administered 2018-03-29: 1000 mg via INTRAVENOUS
  Filled 2018-03-29: qty 100

## 2018-03-29 MED ORDER — METOCLOPRAMIDE HCL 5 MG PO TABS: 5.0000 mg | ORAL_TABLET | Freq: Three times a day (TID) | ORAL | Status: DC | PRN

## 2018-03-29 MED ORDER — PROPOFOL 10 MG/ML IV BOLUS
INTRAVENOUS | Status: AC
  Filled 2018-03-29: qty 60

## 2018-03-29 MED ORDER — DOCUSATE SODIUM 100 MG PO CAPS
100.0000 mg | ORAL_CAPSULE | Freq: Two times a day (BID) | ORAL | Status: DC
  Administered 2018-03-29 – 2018-03-30 (×2): 100 mg via ORAL
  Filled 2018-03-29 (×2): qty 1

## 2018-03-29 MED ORDER — PHENOL 1.4 % MT LIQD
1.0000 | OROMUCOSAL | Status: DC | PRN
  Filled 2018-03-29: qty 177

## 2018-03-29 MED ORDER — EPHEDRINE 5 MG/ML INJ
INTRAVENOUS | Status: AC
  Filled 2018-03-29: qty 20

## 2018-03-29 MED ORDER — PROPOFOL 500 MG/50ML IV EMUL
INTRAVENOUS | Status: DC | PRN
  Administered 2018-03-29: 50 ug/kg/min via INTRAVENOUS

## 2018-03-29 MED ORDER — PRAVASTATIN SODIUM 20 MG PO TABS
20.0000 mg | ORAL_TABLET | Freq: Every day | ORAL | Status: DC
  Administered 2018-03-29: 20 mg via ORAL
  Filled 2018-03-29: qty 1

## 2018-03-29 MED ORDER — ALBUTEROL SULFATE (2.5 MG/3ML) 0.083% IN NEBU: 3.0000 mL | INHALATION_SOLUTION | RESPIRATORY_TRACT | Status: DC | PRN

## 2018-03-29 MED ORDER — SODIUM CHLORIDE 0.9 % IV SOLN
INTRAVENOUS | Status: DC
  Administered 2018-03-29: 13:00:00 via INTRAVENOUS

## 2018-03-29 MED ORDER — MIDAZOLAM HCL 2 MG/2ML IJ SOLN
1.0000 mg | INTRAMUSCULAR | Status: DC
  Administered 2018-03-29: 1 mg via INTRAVENOUS
  Filled 2018-03-29: qty 2

## 2018-03-29 MED ORDER — OXYCODONE HCL 5 MG PO TABS
5.0000 mg | ORAL_TABLET | ORAL | Status: DC | PRN
  Administered 2018-03-30: 10 mg via ORAL
  Filled 2018-03-29: qty 2

## 2018-03-29 MED ORDER — METHOCARBAMOL 500 MG PO TABS
500.0000 mg | ORAL_TABLET | Freq: Four times a day (QID) | ORAL | Status: DC | PRN
  Administered 2018-03-30: 500 mg via ORAL
  Filled 2018-03-29: qty 1

## 2018-03-29 MED ORDER — ACETAMINOPHEN 10 MG/ML IV SOLN
1000.0000 mg | Freq: Four times a day (QID) | INTRAVENOUS | Status: DC
  Administered 2018-03-29: 1000 mg via INTRAVENOUS
  Filled 2018-03-29: qty 100

## 2018-03-29 MED ORDER — FENTANYL CITRATE (PF) 100 MCG/2ML IJ SOLN
INTRAMUSCULAR | Status: AC
  Filled 2018-03-29: qty 2

## 2018-03-29 MED ORDER — LACTATED RINGERS IV SOLN
INTRAVENOUS | Status: DC
  Administered 2018-03-29 (×3): via INTRAVENOUS

## 2018-03-29 MED ORDER — MENTHOL 3 MG MT LOZG: 1.0000 | LOZENGE | OROMUCOSAL | Status: DC | PRN

## 2018-03-29 MED ORDER — ONDANSETRON HCL 4 MG/2ML IJ SOLN
INTRAMUSCULAR | Status: DC | PRN
  Administered 2018-03-29: 4 mg via INTRAVENOUS

## 2018-03-29 MED ORDER — CLONIDINE HCL (ANALGESIA) 100 MCG/ML EP SOLN
EPIDURAL | Status: DC | PRN
  Administered 2018-03-29: 70 ug

## 2018-03-29 MED ORDER — SODIUM CHLORIDE 0.9 % IR SOLN
Status: DC | PRN
  Administered 2018-03-29: 1000 mL

## 2018-03-29 MED ORDER — HYDROMORPHONE HCL 1 MG/ML IJ SOLN: 0.2500 mg | INTRAMUSCULAR | Status: DC | PRN

## 2018-03-29 SURGICAL SUPPLY — 64 items
ATTUNE MED DOME PAT 38 KNEE (Knees) ×1 IMPLANT
ATTUNE MED DOME PAT 38MM KNEE (Knees) ×1 IMPLANT
ATTUNE PS FEM RT SZ 6 CEM KNEE (Femur) ×2 IMPLANT
ATTUNE PSRP INSR SZ6 6 KNEE (Insert) ×1 IMPLANT
ATTUNE PSRP INSR SZ6 6MM KNEE (Insert) ×1 IMPLANT
BAG SPEC THK2 15X12 ZIP CLS (MISCELLANEOUS) ×1
BAG ZIPLOCK 12X15 (MISCELLANEOUS) ×3 IMPLANT
BANDAGE ACE 6X5 VEL STRL LF (GAUZE/BANDAGES/DRESSINGS) ×3 IMPLANT
BANDAGE ELASTIC 6 VELCRO ST LF (GAUZE/BANDAGES/DRESSINGS) ×2 IMPLANT
BASE TIBIA ATTUNE KNEE SYS SZ6 (Knees) IMPLANT
BLADE SAG 18X100X1.27 (BLADE) ×3 IMPLANT
BLADE SAW SGTL 11.0X1.19X90.0M (BLADE) ×3 IMPLANT
BLADE SURG SZ10 CARB STEEL (BLADE) ×6 IMPLANT
BOWL SMART MIX CTS (DISPOSABLE) ×3 IMPLANT
BSPLAT TIB 6 CMNT ROT PLAT STR (Knees) ×1 IMPLANT
CEMENT HV SMART SET (Cement) ×6 IMPLANT
CLOSURE WOUND 1/2 X4 (GAUZE/BANDAGES/DRESSINGS) ×2
COVER SURGICAL LIGHT HANDLE (MISCELLANEOUS) ×3 IMPLANT
COVER WAND RF STERILE (DRAPES) ×2 IMPLANT
CUFF TOURN SGL QUICK 34 (TOURNIQUET CUFF) ×3
CUFF TRNQT CYL 34X4X40X1 (TOURNIQUET CUFF) ×1 IMPLANT
DECANTER SPIKE VIAL GLASS SM (MISCELLANEOUS) ×3 IMPLANT
DRAPE U-SHAPE 47X51 STRL (DRAPES) ×3 IMPLANT
DRSG ADAPTIC 3X8 NADH LF (GAUZE/BANDAGES/DRESSINGS) ×3 IMPLANT
DRSG PAD ABDOMINAL 8X10 ST (GAUZE/BANDAGES/DRESSINGS) ×3 IMPLANT
DURAPREP 26ML APPLICATOR (WOUND CARE) ×3 IMPLANT
ELECT REM PT RETURN 15FT ADLT (MISCELLANEOUS) ×3 IMPLANT
EVACUATOR 1/8 PVC DRAIN (DRAIN) ×3 IMPLANT
GAUZE SPONGE 4X4 12PLY STRL (GAUZE/BANDAGES/DRESSINGS) ×3 IMPLANT
GLOVE BIO SURGEON STRL SZ7 (GLOVE) ×3 IMPLANT
GLOVE BIO SURGEON STRL SZ8 (GLOVE) ×3 IMPLANT
GLOVE BIOGEL PI IND STRL 6.5 (GLOVE) ×1 IMPLANT
GLOVE BIOGEL PI IND STRL 7.0 (GLOVE) ×1 IMPLANT
GLOVE BIOGEL PI IND STRL 8 (GLOVE) ×1 IMPLANT
GLOVE BIOGEL PI INDICATOR 6.5 (GLOVE) ×2
GLOVE BIOGEL PI INDICATOR 7.0 (GLOVE) ×2
GLOVE BIOGEL PI INDICATOR 8 (GLOVE) ×2
GLOVE SURG SS PI 6.5 STRL IVOR (GLOVE) ×3 IMPLANT
GOWN STRL REUS W/TWL LRG LVL3 (GOWN DISPOSABLE) ×6 IMPLANT
GOWN STRL REUS W/TWL XL LVL3 (GOWN DISPOSABLE) ×3 IMPLANT
HANDPIECE INTERPULSE COAX TIP (DISPOSABLE) ×3
HOLDER FOLEY CATH W/STRAP (MISCELLANEOUS) ×2 IMPLANT
IMMOBILIZER KNEE 20 (SOFTGOODS) ×5 IMPLANT
IMMOBILIZER KNEE 20 THIGH 36 (SOFTGOODS) ×1 IMPLANT
MANIFOLD NEPTUNE II (INSTRUMENTS) ×3 IMPLANT
NS IRRIG 1000ML POUR BTL (IV SOLUTION) ×3 IMPLANT
PACK TOTAL KNEE CUSTOM (KITS) ×3 IMPLANT
PAD ABD 8X10 STRL (GAUZE/BANDAGES/DRESSINGS) ×2 IMPLANT
PADDING CAST COTTON 6X4 STRL (CAST SUPPLIES) ×5 IMPLANT
PIN STEINMAN FIXATION KNEE (PIN) ×2 IMPLANT
PIN THREADED HEADED SIGMA (PIN) ×2 IMPLANT
PROTECTOR NERVE ULNAR (MISCELLANEOUS) ×3 IMPLANT
SET HNDPC FAN SPRY TIP SCT (DISPOSABLE) ×1 IMPLANT
STRIP CLOSURE SKIN 1/2X4 (GAUZE/BANDAGES/DRESSINGS) ×4 IMPLANT
SUT MNCRL AB 4-0 PS2 18 (SUTURE) ×3 IMPLANT
SUT STRATAFIX 0 PDS 27 VIOLET (SUTURE) ×3
SUT VIC AB 2-0 CT1 27 (SUTURE) ×9
SUT VIC AB 2-0 CT1 TAPERPNT 27 (SUTURE) ×3 IMPLANT
SUTURE STRATFX 0 PDS 27 VIOLET (SUTURE) ×1 IMPLANT
TIBIA ATTUNE KNEE SYS BASE SZ6 (Knees) ×3 IMPLANT
TRAY FOLEY MTR SLVR 16FR STAT (SET/KITS/TRAYS/PACK) ×3 IMPLANT
WATER STERILE IRR 1000ML POUR (IV SOLUTION) ×6 IMPLANT
WRAP KNEE MAXI GEL POST OP (GAUZE/BANDAGES/DRESSINGS) ×3 IMPLANT
YANKAUER SUCT BULB TIP 10FT TU (MISCELLANEOUS) ×3 IMPLANT

## 2018-03-29 NOTE — Anesthesia Procedure Notes (Signed)
Anesthesia Regional Block: Adductor canal block   Pre-Anesthetic Checklist: ,, timeout performed, Correct Patient, Correct Site, Correct Laterality, Correct Procedure, Correct Position, site marked, Risks and benefits discussed,  Surgical consent,  Pre-op evaluation,  At surgeon's request and post-op pain management  Laterality: Right  Prep: chloraprep       Needles:  Injection technique: Single-shot  Needle Type: Echogenic Needle     Needle Length: 9cm      Additional Needles:   Procedures:,,,, ultrasound used (permanent image in chart),,,,  Narrative:  Start time: 03/29/2018 7:30 AM End time: 03/29/2018 7:40 AM Injection made incrementally with aspirations every 5 mL.  Performed by: Personally  Anesthesiologist: Eilene Ghazi, MD  Additional Notes: Patient tolerated the procedure well without complications

## 2018-03-29 NOTE — Anesthesia Postprocedure Evaluation (Signed)
Anesthesia Post Note  Patient: [name]  Procedure(s) Performed: RIGHT TOTAL KNEE ARTHROPLASTY (Right Knee)     Patient location during evaluation: PACU Anesthesia Type: Spinal Level of consciousness: awake and alert Pain management: pain level controlled Vital Signs Assessment: post-procedure vital signs reviewed and stable Respiratory status: spontaneous breathing, nonlabored ventilation, respiratory function stable and patient connected to nasal cannula oxygen Cardiovascular status: blood pressure returned to baseline and stable Postop Assessment: no apparent nausea or vomiting Anesthetic complications: no    Last Vitals:  Vitals:   03/29/18 1405 03/29/18 1606  BP: 105/72 103/69  Pulse: 62 (!) 57  Resp: 17 16  Temp:    SpO2: 100% 100%    Last Pain:  Vitals:   03/29/18 1130  TempSrc:   PainSc: 0-No pain                 Myra Weng S

## 2018-03-29 NOTE — Plan of Care (Signed)
Plan of care 

## 2018-03-29 NOTE — Transfer of Care (Signed)
Immediate Anesthesia Transfer of Care Note  Patient: [name]  Procedure(s) Performed: Procedure(s) with comments: RIGHT TOTAL KNEE ARTHROPLASTY (Right) -  Patient Location: PACU  Anesthesia Type:Spinal  Level of Consciousness:  sedated, patient cooperative and responds to stimulation  Airway & Oxygen Therapy:Patient Spontanous Breathing and Patient connected to face mask oxgen  Post-op Assessment:  Report given to PACU RN and Post -op Vital signs reviewed and stable  Post vital signs:  Reviewed and stable  Last Vitals:  Vitals:   03/29/18 0753 03/29/18 0942  BP: 135/74 121/70  Pulse: (!) 54 64  Resp: 16 16  Temp:    SpO2: 99% 100%    Complications: No apparent anesthesia complications

## 2018-03-29 NOTE — Anesthesia Procedure Notes (Signed)
Anesthesia Procedure Image    

## 2018-03-29 NOTE — Evaluation (Signed)
Physical Therapy Evaluation Patient Details Name: [name] MRN: [id number] DOB: 14-Mar-1941 Today's Date: 03/29/2018   History of Present Illness  78 yo female s/p R TKR on 03/29/18. PMH includes UTI, syncope, hep C, HLD, OA, c-spine spondylosis with radiculopathy, HTN, PNA, L TKR 2018.   Clinical Impression   Pt presents with R knee pain, decreased R knee ROM, increased time and effort to perform mobility tasks, and decreased activity tolerance due to R knee pain. Pt to benefit from acute PT to address deficits. Pt ambulated 90 ft with RW with min guard assist, verbal cuing provided throughout. Pt educated on ankle pumps (20/hour) to perform this afternoon/evening to increase circulation, to pt's tolerance and limited by pain. PT to progress mobility as tolerated, and will continue to follow acutely.        Follow Up Recommendations Follow surgeon's recommendation for DC plan and follow-up therapies;Supervision for mobility/OOB(OPPT)    Equipment Recommendations  None recommended by PT    Recommendations for Other Services       Precautions / Restrictions Precautions Precautions: Fall Required Braces or Orthoses: Knee Immobilizer - Right Knee Immobilizer - Right: On when out of bed or walking;Discontinue once straight leg raise with < 10 degree lag Restrictions Weight Bearing Restrictions: No Other Position/Activity Restrictions: WBAT       Mobility  Bed Mobility Overal bed mobility: Needs Assistance Bed Mobility: Supine to Sit     Supine to sit: Min guard;HOB elevated     General bed mobility comments: Min guard for safety, increased time and effort. Pt reporting mild dizziness upon sitting EOB, BP and HR 98/74 and 68 bpm. Pt with decreased dizziness after 2 minutes, highly motivated to continue with mobility.   Transfers Overall transfer level: Needs assistance Equipment used: Rolling walker (2 wheeled) Transfers: Sit to/from Stand Sit to Stand: Min guard;From  elevated surface         General transfer comment: Min guard for safety, verbal cuing for hand placement. No dizziness upon standing.   Ambulation/Gait Ambulation/Gait assistance: Min guard;+2 safety/equipment(chair follow) Gait Distance (Feet): 90 Feet Assistive device: Rolling walker (2 wheeled) Gait Pattern/deviations: Step-to pattern;Decreased weight shift to right Gait velocity: decr    General Gait Details: Min guard for safety. Verbal cuing for placement in RW, sequencing.   Stairs            Wheelchair Mobility    Modified Rankin (Stroke Patients Only)       Balance Overall balance assessment: Mild deficits observed, not formally tested                                           Pertinent Vitals/Pain Pain Assessment: 0-10 Pain Score: 6  Pain Location: R knee, with ambulation  Pain Descriptors / Indicators: Sore;Aching Pain Intervention(s): Limited activity within patient's tolerance;Repositioned;Ice applied;Monitored during session;Premedicated before session    Home Living Family/patient expects to be discharged to:: Private residence Living Arrangements: Spouse/significant other Available Help at Discharge: Family;Available 24 hours/day Type of Home: House Home Access: Stairs to enter Entrance Stairs-Rails: None Entrance Stairs-Number of Steps: 1+2 Home Layout: One level Home Equipment: Shower seat - built in;Walker - 2 wheels;Crutches;Cane - single point;Other (comment);Bedside commode(frame around toilet )      Prior Function Level of Independence: Independent               Hand Dominance  Dominant Hand: Right    Extremity/Trunk Assessment   Upper Extremity Assessment Upper Extremity Assessment: Overall WFL for tasks assessed    Lower Extremity Assessment Lower Extremity Assessment: Overall WFL for tasks assessed;RLE deficits/detail RLE Deficits / Details: suspected post-surgical weakness; able to perform ankle  pumps, quad sets, SLR with <10* quad lag, assisted heel slides to 90* flexion  RLE Sensation: WNL    Cervical / Trunk Assessment Cervical / Trunk Assessment: Normal  Communication   Communication: No difficulties  Cognition Arousal/Alertness: Awake/alert Behavior During Therapy: WFL for tasks assessed/performed Overall Cognitive Status: Within Functional Limits for tasks assessed                                        General Comments      Exercises Total Joint Exercises Goniometric ROM: knee aarom ~5-90*, limited by pain    Assessment/Plan    PT Assessment Patient needs continued PT services  PT Problem List Decreased strength;Pain;Decreased range of motion;Decreased activity tolerance;Decreased knowledge of use of DME;Decreased balance;Decreased mobility       PT Treatment Interventions DME instruction;Therapeutic activities;Gait training;Therapeutic exercise;Patient/family education;Stair training;Balance training;Functional mobility training    PT Goals (Current goals can be found in the Care Plan section)  Acute Rehab PT Goals Patient Stated Goal: none stated PT Goal Formulation: With patient Time For Goal Achievement: 04/05/18    Frequency 7X/week   Barriers to discharge        Co-evaluation               AM-PAC PT "6 Clicks" Mobility  Outcome Measure Help needed turning from your back to your side while in a flat bed without using bedrails?: A Little Help needed moving from lying on your back to sitting on the side of a flat bed without using bedrails?: A Little Help needed moving to and from a bed to a chair (including a wheelchair)?: A Little Help needed standing up from a chair using your arms (e.g., wheelchair or bedside chair)?: A Little Help needed to walk in hospital room?: A Little Help needed climbing 3-5 steps with a railing? : A Little 6 Click Score: 18    End of Session Equipment Utilized During Treatment: Gait belt;Right  knee immobilizer Activity Tolerance: Patient tolerated treatment well Patient left: in chair;with chair alarm set;with call bell/phone within reach;with SCD's reapplied Nurse Communication: Mobility status PT Visit Diagnosis: Other abnormalities of gait and mobility (R26.89);Difficulty in walking, not elsewhere classified (R26.2)    Time: 6834-1962 PT Time Calculation (min) (ACUTE ONLY): 30 min   Charges:   PT Evaluation $PT Eval Low Complexity: 1 Low PT Treatments $Gait Training: 8-22 mins       Nicola Police, PT Acute Rehabilitation Services Pager [phone]  Office [phone]  Jameriah Trotti D Despina Hidden 03/29/2018, 2:46 PM

## 2018-03-29 NOTE — Progress Notes (Signed)
Assisted Dr. Rose with right, ultrasound guided, adductor canal block. Side rails up, monitors on throughout procedure. See vital signs in flow sheet. Tolerated Procedure well.  

## 2018-03-29 NOTE — Discharge Instructions (Signed)
° °Dr. Frank Aluisio °Total Joint Specialist °[facility] °[address] °[place], [place] [address] °[phone] ° °TOTAL KNEE REPLACEMENT POSTOPERATIVE DIRECTIONS ° °Knee Rehabilitation, Guidelines Following Surgery  °Results after knee surgery are often greatly improved when you follow the exercise, range of motion and muscle strengthening exercises prescribed by your doctor. Safety measures are also important to protect the knee from further injury. Any time any of these exercises cause you to have increased pain or swelling in your knee joint, decrease the amount until you are comfortable again and slowly increase them. If you have problems or questions, call your caregiver or physical therapist for advice.  ° °HOME CARE INSTRUCTIONS  °• Remove items at home which could result in a fall. This includes throw rugs or furniture in walking pathways.  °· ICE to the affected knee every three hours for 30 minutes at a time and then as needed for pain and swelling.  Continue to use ice on the knee for pain and swelling from surgery. You may notice swelling that will progress down to the foot and ankle.  This is normal after surgery.  Elevate the leg when you are not up walking on it.   °· Continue to use the breathing machine which will help keep your temperature down.  It is common for your temperature to cycle up and down following surgery, especially at night when you are not up moving around and exerting yourself.  The breathing machine keeps your lungs expanded and your temperature down. °· Do not place pillow under knee, focus on keeping the knee straight while resting ° °DIET °You may resume your previous home diet once your are discharged from the hospital. ° °DRESSING / WOUND CARE / SHOWERING °You may shower 3 days after surgery, but keep the wounds dry during showering.  You may use an occlusive plastic wrap (Press'n Seal for example), NO SOAKING/SUBMERGING IN THE BATHTUB.  If the bandage  gets wet, change with a clean dry gauze.  If the incision gets wet, pat the wound dry with a clean towel. °You may start showering once you are discharged home but do not submerge the incision under water. Just pat the incision dry and apply a dry gauze dressing on daily. °Change the surgical dressing daily and reapply a dry dressing each time. ° °ACTIVITY °Walk with your walker as instructed. °Use walker as long as suggested by your caregivers. °Avoid periods of inactivity such as sitting longer than an hour when not asleep. This helps prevent blood clots.  °You may resume a sexual relationship in one month or when given the OK by your doctor.  °You may return to work once you are cleared by your doctor.  °Do not drive a car for 6 weeks or until released by you surgeon.  °Do not drive while taking narcotics. ° °WEIGHT BEARING °Weight bearing as tolerated with assist device (walker, cane, etc) as directed, use it as long as suggested by your surgeon or therapist, typically at least 4-6 weeks. ° °POSTOPERATIVE CONSTIPATION PROTOCOL °Constipation - defined medically as fewer than three stools per week and severe constipation as less than one stool per week. ° °One of the most common issues patients have following surgery is constipation.  Even if you have a regular bowel pattern at home, your normal regimen is likely to be disrupted due to multiple reasons following surgery.  Combination of anesthesia, postoperative narcotics, change in appetite and fluid intake all can affect your bowels.    In order to avoid complications following surgery, here are some recommendations in order to help you during your recovery period. ° °Colace (docusate) - Pick up an over-the-counter form of Colace or another stool softener and take twice a day as long as you are requiring postoperative pain medications.  Take with a full glass of water daily.  If you experience loose stools or diarrhea, hold the colace until you stool forms back  up.  If your symptoms do not get better within 1 week or if they get worse, check with your doctor. ° °Dulcolax (bisacodyl) - Pick up over-the-counter and take as directed by the product packaging as needed to assist with the movement of your bowels.  Take with a full glass of water.  Use this product as needed if not relieved by Colace only.  ° °MiraLax (polyethylene glycol) - Pick up over-the-counter to have on hand.  MiraLax is a solution that will increase the amount of water in your bowels to assist with bowel movements.  Take as directed and can mix with a glass of water, juice, soda, coffee, or tea.  Take if you go more than two days without a movement. °Do not use MiraLax more than once per day. Call your doctor if you are still constipated or irregular after using this medication for 7 days in a row. ° °If you continue to have problems with postoperative constipation, please contact the office for further assistance and recommendations.  If you experience "the worst abdominal pain ever" or develop nausea or vomiting, please contact the office immediatly for further recommendations for treatment. ° °ITCHING ° If you experience itching with your medications, try taking only a single pain pill, or even half a pain pill at a time.  You can also use Benadryl over the counter for itching or also to help with sleep.  ° °TED HOSE STOCKINGS °Wear the elastic stockings on both legs for three weeks following surgery during the day but you may remove then at night for sleeping. ° °MEDICATIONS °See your medication summary on the “After Visit Summary” that the nursing staff will review with you prior to discharge.  You may have some home medications which will be placed on hold until you complete the course of blood thinner medication.  It is important for you to complete the blood thinner medication as prescribed by your surgeon.  Continue your approved medications as instructed at time of discharge. ° °PRECAUTIONS °If  you experience chest pain or shortness of breath - call 911 immediately for transfer to the hospital emergency department.  °If you develop a fever greater that 101 F, purulent drainage from wound, increased redness or drainage from wound, foul odor from the wound/dressing, or calf pain - CONTACT YOUR SURGEON.   °                                                °FOLLOW-UP APPOINTMENTS °Make sure you keep all of your appointments after your operation with your surgeon and caregivers. You should call the office at the above phone number and make an appointment for approximately two weeks after the date of your surgery or on the date instructed by your surgeon outlined in the "After Visit Summary". ° ° °RANGE OF MOTION AND STRENGTHENING EXERCISES  °Rehabilitation of the knee is important following a knee injury or   an operation. After just a few days of immobilization, the muscles of the thigh which control the knee become weakened and shrink (atrophy). Knee exercises are designed to build up the tone and strength of the thigh muscles and to improve knee motion. Often times heat used for twenty to thirty minutes before working out will loosen up your tissues and help with improving the range of motion but do not use heat for the first two weeks following surgery. These exercises can be done on a training (exercise) mat, on the floor, on a table or on a bed. Use what ever works the best and is most comfortable for you Knee exercises include:  °• Leg Lifts - While your knee is still immobilized in a splint or cast, you can do straight leg raises. Lift the leg to 60 degrees, hold for 3 sec, and slowly lower the leg. Repeat 10-20 times 2-3 times daily. Perform this exercise against resistance later as your knee gets better.  °• Quad and Hamstring Sets - Tighten up the muscle on the front of the thigh (Quad) and hold for 5-10 sec. Repeat this 10-20 times hourly. Hamstring sets are done by pushing the foot backward against an  object and holding for 5-10 sec. Repeat as with quad sets.  °· Leg Slides: Lying on your back, slowly slide your foot toward your buttocks, bending your knee up off the floor (only go as far as is comfortable). Then slowly slide your foot back down until your leg is flat on the floor again. °· Angel Wings: Lying on your back spread your legs to the side as far apart as you can without causing discomfort.  °A rehabilitation program following serious knee injuries can speed recovery and prevent re-injury in the future due to weakened muscles. Contact your doctor or a physical therapist for more information on knee rehabilitation.  ° °IF YOU ARE TRANSFERRED TO A SKILLED REHAB FACILITY °If the patient is transferred to a skilled rehab facility following release from the hospital, a list of the current medications will be sent to the facility for the patient to continue.  When discharged from the skilled rehab facility, please have the facility set up the patient's Home Health Physical Therapy prior to being released. Also, the skilled facility will be responsible for providing the patient with their medications at time of release from the facility to include their pain medication, the muscle relaxants, and their blood thinner medication. If the patient is still at the rehab facility at time of the two week follow up appointment, the skilled rehab facility will also need to assist the patient in arranging follow up appointment in our office and any transportation needs. ° °MAKE SURE YOU:  °• Understand these instructions.  °• Get help right away if you are not doing well or get worse.  ° ° °Pick up stool softner and laxative for home use following surgery while on pain medications. °Do not submerge incision under water. °Please use good hand washing techniques while changing dressing each day. °May shower starting three days after surgery. °Please use a clean towel to pat the incision dry following showers. °Continue to  use ice for pain and swelling after surgery. °Do not use any lotions or creams on the incision until instructed by your surgeon. ° °

## 2018-03-29 NOTE — Interval H&P Note (Signed)
History and Physical Interval Note:  03/29/2018 6:30 AM  [name]  has presented today for surgery, with the diagnosis of right knee osteoarthritis  The various methods of treatment have been discussed with the patient and family. After consideration of risks, benefits and other options for treatment, the patient has consented to  Procedure(s) with comments: RIGHT TOTAL KNEE ARTHROPLASTY (Right) - as a surgical intervention .  The patient's history has been reviewed, patient examined, no change in status, stable for surgery.  I have reviewed the patient's chart and labs.  Questions were answered to the patient's satisfaction.     Homero Fellers [name]

## 2018-03-29 NOTE — Op Note (Signed)
OPERATIVE REPORT-TOTAL KNEE ARTHROPLASTY   Pre-operative diagnosis- Osteoarthritis  Right knee(s)  Post-operative diagnosis- Osteoarthritis Right knee(s)  Procedure-  Right  Total Knee Arthroplasty  Surgeon- Gus RankinFrank V. Khan Chura, MD  Assistant- Arther AbbottKristie Edmisten, PA-C   Anesthesia-  Adductor canal block and spinal  EBL- 25 ml   Drains Hemovac  Tourniquet time-  Total Tourniquet Time Documented: Thigh (Right) - 36 minutes Total: Thigh (Right) - 36 minutes     Complications- None  Condition-PACU - hemodynamically stable.   Brief Clinical Note  [name] is a 78 y.o. year old female with end stage OA of her right knee with progressively worsening pain and dysfunction. She has constant pain, with activity and at rest and significant functional deficits with difficulties even with ADLs. She has had extensive non-op management including analgesics, injections of cortisone and viscosupplements, and home exercise program, but remains in significant pain with significant dysfunction.Radiographs show bone on bone arthritis lateral and patellofemoral. She presents now for right Total Knee Arthroplasty.    Procedure in detail---   The patient is brought into the operating room and positioned supine on the operating table. After successful administration of  Adductor canal block and spinal,   a tourniquet is placed high on the  Right thigh(s) and the lower extremity is prepped and draped in the usual sterile fashion. Time out is performed by the operating team and then the  Right lower extremity is wrapped in Esmarch, knee flexed and the tourniquet inflated to 300 mmHg.       A midline incision is made with a ten blade through the subcutaneous tissue to the level of the extensor mechanism. A fresh blade is used to make a medial parapatellar arthrotomy. Soft tissue over the proximal medial tibia is subperiosteally elevated to the joint line with a knife and into the semimembranosus bursa with  a Cobb elevator. Soft tissue over the proximal lateral tibia is elevated with attention being paid to avoiding the patellar tendon on the tibial tubercle. The patella is everted, knee flexed 90 degrees and the ACL and PCL are removed. Findings are bone on bone lateral and patellofemoral        The drill is used to create a starting hole in the distal femur and the canal is thoroughly irrigated with sterile saline to remove the fatty contents. The 5 degree Right  valgus alignment guide is placed into the femoral canal and the distal femoral cutting block is pinned to remove 9 mm off the distal femur. Resection is made with an oscillating saw.      The tibia is subluxed forward and the menisci are removed. The extramedullary alignment guide is placed referencing proximally at the medial aspect of the tibial tubercle and distally along the second metatarsal axis and tibial crest. The block is pinned to remove 2mm off the more deficient lateral  side. Resection is made with an oscillating saw. Size 6is the most appropriate size for the tibia and the proximal tibia is prepared with the modular drill and keel punch for that size.      The femoral sizing guide is placed and size 6 is most appropriate. Rotation is marked off the epicondylar axis and confirmed by creating a rectangular flexion gap at 90 degrees. The size 6 cutting block is pinned in this rotation and the anterior, posterior and chamfer cuts are made with the oscillating saw. The intercondylar block is then placed and that cut is made.  Trial size 6 tibial component, trial size 6 posterior stabilized femur and a 6  mm posterior stabilized rotating platform insert trial is placed. Full extension is achieved with excellent varus/valgus and anterior/posterior balance throughout full range of motion. The patella is everted and thickness measured to be 24  mm. Free hand resection is taken to 14 mm, a 38 template is placed, lug holes are drilled, trial  patella is placed, and it tracks normally. Osteophytes are removed off the posterior femur with the trial in place. All trials are removed and the cut bone surfaces prepared with pulsatile lavage. Cement is mixed and once ready for implantation, the size 6 tibial implant, size  6 posterior stabilized femoral component, and the size 38 patella are cemented in place and the patella is held with the clamp. The trial insert is placed and the knee held in full extension. The Exparel (20 ml mixed with 60 ml saline) is injected into the extensor mechanism, posterior capsule, medial and lateral gutters and subcutaneous tissues.  All extruded cement is removed and once the cement is hard the permanent 6 mm posterior stabilized rotating platform insert is placed into the tibial tray.      The wound is copiously irrigated with saline solution and the extensor mechanism closed over a hemovac drain with #1 V-loc suture. The tourniquet is released for a total tourniquet time of 36  minutes. Flexion against gravity is 140 degrees and the patella tracks normally. Subcutaneous tissue is closed with 2.0 vicryl and subcuticular with running 4.0 Monocryl. The incision is cleaned and dried and steri-strips and a bulky sterile dressing are applied. The limb is placed into a knee immobilizer and the patient is awakened and transported to recovery in stable condition.      Please note that a surgical assistant was a medical necessity for this procedure in order to perform it in a safe and expeditious manner. Surgical assistant was necessary to retract the ligaments and vital neurovascular structures to prevent injury to them and also necessary for proper positioning of the limb to allow for anatomic placement of the prosthesis.   Gus Rankin Gimena Buick, MD    03/29/2018, 9:19 AM

## 2018-03-29 NOTE — Anesthesia Procedure Notes (Signed)
Spinal  Patient location during procedure: OR Start time: 03/29/2018 8:07 AM End time: 03/29/2018 8:14 AM Staffing Anesthesiologist: Rose, George, MD Performed: anesthesiologist  Preanesthetic Checklist Completed: patient identified, site marked, surgical consent, pre-op evaluation, timeout performed, IV checked, risks and benefits discussed and monitors and equipment checked Spinal Block Patient position: sitting Prep: ChloraPrep Patient monitoring: heart rate, continuous pulse ox and blood pressure Location: L3-4 Injection technique: single-shot Needle Needle type: Sprotte  Needle gauge: 24 G Needle length: 9 cm Additional Notes Expiration date of kit checked and confirmed. Patient tolerated procedure well, without complications.       

## 2018-03-29 NOTE — Anesthesia Preprocedure Evaluation (Signed)
Anesthesia Evaluation  Patient identified by MRN, date of birth, ID band Patient awake    Reviewed: Allergy & Precautions, NPO status , Patient's Chart, lab work & pertinent test results  Airway Mallampati: II  TM Distance: >3 FB Neck ROM: Full    Dental no notable dental hx.    Pulmonary neg pulmonary ROS,    Pulmonary exam normal breath sounds clear to auscultation       Cardiovascular hypertension, Normal cardiovascular exam Rhythm:Regular Rate:Normal     Neuro/Psych negative neurological ROS  negative psych ROS   GI/Hepatic negative GI ROS, Neg liver ROS,   Endo/Other  negative endocrine ROS  Renal/GU negative Renal ROS  negative genitourinary   Musculoskeletal negative musculoskeletal ROS (+)   Abdominal   Peds negative pediatric ROS (+)  Hematology negative hematology ROS (+)   Anesthesia Other Findings   Reproductive/Obstetrics negative OB ROS                             Anesthesia Physical Anesthesia Plan  ASA: II  Anesthesia Plan: Spinal   Post-op Pain Management:  Regional for Post-op pain   Induction: Intravenous  PONV Risk Score and Plan: 2 and Ondansetron and Dexamethasone  Airway Management Planned: Simple Face Mask  Additional Equipment:   Intra-op Plan:   Post-operative Plan:   Informed Consent: I have reviewed the patients History and Physical, chart, labs and discussed the procedure including the risks, benefits and alternatives for the proposed anesthesia with the patient or authorized representative who has indicated his/her understanding and acceptance.   Dental advisory given  Plan Discussed with: CRNA and Surgeon  Anesthesia Plan Comments:         Anesthesia Quick Evaluation

## 2018-03-30 ENCOUNTER — Encounter (HOSPITAL_COMMUNITY): Payer: Self-pay | Admitting: Orthopedic Surgery

## 2018-03-30 DIAGNOSIS — M1711 Unilateral primary osteoarthritis, right knee: Secondary | ICD-10-CM | POA: Diagnosis not present

## 2018-03-30 LAB — CBC
HEMATOCRIT: 33.9 % — AB (ref 36.0–46.0)
Hemoglobin: 10.8 g/dL — ABNORMAL LOW (ref 12.0–15.0)
MCH: 29.6 pg (ref 26.0–34.0)
MCHC: 31.9 g/dL (ref 30.0–36.0)
MCV: 92.9 fL (ref 80.0–100.0)
Platelets: 154 10*3/uL (ref 150–400)
RBC: 3.65 MIL/uL — ABNORMAL LOW (ref 3.87–5.11)
RDW: 12.8 % (ref 11.5–15.5)
WBC: 8.9 10*3/uL (ref 4.0–10.5)
nRBC: 0 % (ref 0.0–0.2)

## 2018-03-30 LAB — BASIC METABOLIC PANEL
Anion gap: 7 (ref 5–15)
BUN: 11 mg/dL (ref 8–23)
CO2: 23 mmol/L (ref 22–32)
Calcium: 8.9 mg/dL (ref 8.9–10.3)
Chloride: 111 mmol/L (ref 98–111)
Creatinine, Ser: 0.53 mg/dL (ref 0.44–1.00)
GFR calc Af Amer: >60 mL/min (ref >60)
GFR calc non Af Amer: >60 mL/min (ref >60)
GLUCOSE: 117 mg/dL — AB (ref 70–99)
Potassium: 4.1 mmol/L (ref 3.5–5.1)
Sodium: 141 mmol/L (ref 135–145)

## 2018-03-30 MED ORDER — OXYCODONE HCL 5 MG PO TABS: 5.0000 mg | ORAL_TABLET | Freq: Four times a day (QID) | ORAL | 0 refills | Status: DC | PRN

## 2018-03-30 MED ORDER — METHOCARBAMOL 500 MG PO TABS: 500.0000 mg | ORAL_TABLET | Freq: Four times a day (QID) | ORAL | 0 refills | Status: DC | PRN

## 2018-03-30 MED ORDER — ASPIRIN 325 MG PO TBEC: 325.0000 mg | DELAYED_RELEASE_TABLET | Freq: Two times a day (BID) | ORAL | 0 refills | Status: AC

## 2018-03-30 MED ORDER — TRAMADOL HCL 50 MG PO TABS: 50.0000 mg | ORAL_TABLET | Freq: Four times a day (QID) | ORAL | 0 refills | Status: DC | PRN

## 2018-03-30 NOTE — Progress Notes (Signed)
Physical Therapy Treatment Patient Details Name: [name] MRN: [id number] DOB: 04-06-1940 Today's Date: 03/30/2018    History of Present Illness 78 yo female s/p R TKR on 03/29/18. PMH includes UTI, syncope, hep C, HLD, OA, c-spine spondylosis with radiculopathy, HTN, PNA, L TKR 2018.     PT Comments    Progressing well with mobility. Reviewed exercises, gait training, and stair training. Issued HEP for pt to perform 2x/day until she begins OP PT. All education completed. Okay to d/c from PT standpoint-made RN aware.     Follow Up Recommendations  Follow surgeon's recommendation for DC plan and follow-up therapies     Equipment Recommendations  None recommended by PT    Recommendations for Other Services       Precautions / Restrictions Precautions Precautions: Fall Required Braces or Orthoses: Knee Immobilizer - Right(KI d/c-pt able to SLR) Knee Immobilizer - Right: Discontinue once straight leg raise with < 10 degree lag Restrictions Weight Bearing Restrictions: No Other Position/Activity Restrictions: WBAT     Mobility  Bed Mobility Overal bed mobility: Needs Assistance Bed Mobility: Supine to Sit     Supine to sit: Supervision     General bed mobility comments: for safety. No dizziness today.   Transfers Overall transfer level: Needs assistance Equipment used: Rolling walker (2 wheeled) Transfers: Sit to/from Stand Sit to Stand: Min guard         General transfer comment: Close guard for safety. VCs safety, hand placement.   Ambulation/Gait Ambulation/Gait assistance: Min guard Gait Distance (Feet): 125 Feet Assistive device: Rolling walker (2 wheeled) Gait Pattern/deviations: Step-to pattern     General Gait Details: Close guard for safety.    Stairs Stairs: Yes Stairs assistance: Min guard Stair Management: Forwards;With walker;With crutches;One rail Left;Step to pattern   General stair comments: Practiced x 2. Once simulating entry into  home (1 step x 2). Then once simulating stairway to bedroom (up and over portable steps with 1 crutch, 1 rail). VCs safety, technique, sequence. Husband present to observe.    Wheelchair Mobility    Modified Rankin (Stroke Patients Only)       Balance Overall balance assessment: Mild deficits observed, not formally tested                                          Cognition Arousal/Alertness: Awake/alert Behavior During Therapy: WFL for tasks assessed/performed Overall Cognitive Status: Within Functional Limits for tasks assessed                                        Exercises Total Joint Exercises Ankle Circles/Pumps: AROM;Both;10 reps;Supine Quad Sets: AROM;Both;10 reps;Supine Heel Slides: AAROM;Right;10 reps;Supine Hip ABduction/ADduction: AROM;Right;10 reps;Supine Straight Leg Raises: AROM;Right;10 reps;Supine Goniometric ROM: ~5-80 degrees    General Comments        Pertinent Vitals/Pain Pain Assessment: 0-10 Pain Score: 6  Pain Location: R knee, with activity Pain Descriptors / Indicators: Sore Pain Intervention(s): Monitored during session;Ice applied    Home Living                      Prior Function            PT Goals (current goals can now be found in the care plan section) Progress towards PT goals: Progressing  toward goals    Frequency    7X/week      PT Plan Current plan remains appropriate    Co-evaluation              AM-PAC PT "6 Clicks" Mobility   Outcome Measure  Help needed turning from your back to your side while in a flat bed without using bedrails?: None Help needed moving from lying on your back to sitting on the side of a flat bed without using bedrails?: None Help needed moving to and from a bed to a chair (including a wheelchair)?: A Little Help needed standing up from a chair using your arms (e.g., wheelchair or bedside chair)?: A Little Help needed to walk in hospital  room?: A Little Help needed climbing 3-5 steps with a railing? : A Little 6 Click Score: 20    End of Session Equipment Utilized During Treatment: Gait belt Activity Tolerance: Patient tolerated treatment well Patient left: in chair;with call bell/phone within reach;with family/visitor present   PT Visit Diagnosis: Other abnormalities of gait and mobility (R26.89)     Time: 1020-1049 PT Time Calculation (min) (ACUTE ONLY): 29 min  Charges:  $Gait Training: 8-22 mins $Therapeutic Exercise: 8-22 mins                        Rebeca Alert, PT Acute Rehabilitation Services Pager: [phone] Office: [phone]

## 2018-03-30 NOTE — Plan of Care (Signed)
Plan of care reviewed and discussed with the patient. 

## 2018-03-30 NOTE — Progress Notes (Signed)
   Subjective: 1 Day Post-Op Procedure(s) (LRB): RIGHT TOTAL KNEE ARTHROPLASTY (Right) Patient reports pain as mild.   Patient seen in rounds by Dr. Lequita Halt. Patient is well, and has had no acute complaints or problems. No issues overnight. States she is ready to go home. Denies chest pain, SOB, or calf pain. Foley catheter removed this AM. We will continue therapy today.   Objective: Vital signs in last 24 hours: Temp:  [96.3 F (35.7 C)-98.4 F (36.9 C)] 98.4 F (36.9 C) (01/07 0525) Pulse Rate:  [54-64] 60 (01/07 0525) Resp:  [11-19] 17 (01/07 0148) BP: (94-167)/(53-78) 103/63 (01/07 0525) SpO2:  [97 %-100 %] 100 % (01/07 0525) Weight:  [77 kg] 77 kg (01/06 1129)  Intake/Output from previous day:  Intake/Output Summary (Last 24 hours) at 03/30/2018 0702 Last data filed at 03/30/2018 0630 Gross per 24 hour  Intake 4202.1 ml  Output 4125 ml  Net 77.1 ml    Labs: Recent Labs    03/30/18 0428  HGB 10.8*   Recent Labs    03/30/18 0428  WBC 8.9  RBC 3.65*  HCT 33.9*  PLT 154   Recent Labs    03/30/18 0428  NA 141  K 4.1  CL 111  CO2 23  BUN 11  CREATININE 0.53  GLUCOSE 117*  CALCIUM 8.9   Exam: General - Patient is Alert and Oriented Extremity - Neurologically intact Neurovascular intact Sensation intact distally Dorsiflexion/Plantar flexion intact Dressing - dressing C/D/I Motor Function - intact, moving foot and toes well on exam.   Past Medical History:  Diagnosis Date  . Chronic hepatitis C (HCC) 2014   DID HARVONI AND RIBIVORIN [place]   . Headache    MIGRAINE AURA NO HEADACHE  . History of blood transfusion 1990   AFTER GI BLEED  . Hyperlipidemia   . Hypertension   . Multinodular goiter    SHRANK AWAY 25 YRS AGO  . Osteoarthritis    OA  . Palpitations    PACs & PVCs  . Pneumonia YRS AGO    Assessment/Plan: 1 Day Post-Op Procedure(s) (LRB): RIGHT TOTAL KNEE ARTHROPLASTY (Right) Active Problems:   OA (osteoarthritis) of knee  Estimated  body mass index is 27.4 kg/m as calculated from the following:   Height as of this encounter: 5\' 6"  (1.676 m).   Weight as of this encounter: 77 kg. Advance diet Up with therapy D/C IV fluids  Anticipated LOS equal to or greater than 2 midnights due to - Age 76 and older with one or more of the following:  - Obesity  - Expected need for hospital services (PT, OT, Nursing) required for safe  discharge  - Anticipated need for postoperative skilled nursing care or inpatient rehab  - Active co-morbidities: None OR   - Unanticipated findings during/Post Surgery: None  - Patient is a high risk of re-admission due to: None    DVT Prophylaxis - Aspirin Weight bearing as tolerated. D/C O2 and pulse ox and try on room air. Hemovac pulled without difficulty, will continue therapy today.  Plan is to go Home after hospital stay. Plan for discharge following two sessions of therapy. Scheduled for outpatient physical therapy at [facility] Follow-up in the office in 2 weeks.   Arther Abbott, PA-C Orthopedic Surgery 03/30/2018, 7:02 AM

## 2018-03-30 NOTE — Care Management Note (Signed)
Case Management Note  Patient Details  Name: [name] MRN: [id number] Date of Birth: 02/15/1941  Subjective/Objective:     Spoke with patient at bedside. Confirmed plan for OP PT, already arranged. Has RW and 3n1. [phone]               Action/Plan:   Expected Discharge Date:  03/30/18               Expected Discharge Plan:  OP Rehab  In-House Referral:  NA  Discharge planning Services  CM Consult  Post Acute Care Choice:  NA Choice offered to:  Patient  DME Arranged:  N/A DME Agency:  NA  HH Arranged:  NA HH Agency:  NA  Status of Service:  Completed, signed off  If discussed at Long Length of Stay Meetings, dates discussed:    Additional Comments:  Alexis Goodell, RN 03/30/2018, 9:22 AM

## 2018-04-13 DIAGNOSIS — Z471 Aftercare following joint replacement surgery: Secondary | ICD-10-CM | POA: Insufficient documentation

## 2018-09-20 ENCOUNTER — Other Ambulatory Visit (INDEPENDENT_AMBULATORY_CARE_PROVIDER_SITE_OTHER): Payer: Medicare Other

## 2018-09-20 ENCOUNTER — Ambulatory Visit (INDEPENDENT_AMBULATORY_CARE_PROVIDER_SITE_OTHER): Payer: Medicare Other | Admitting: Internal Medicine

## 2018-09-20 ENCOUNTER — Other Ambulatory Visit: Payer: Self-pay

## 2018-09-20 ENCOUNTER — Encounter: Payer: Self-pay | Admitting: Internal Medicine

## 2018-09-20 VITALS — BP 110/80 | HR 83 | Temp 97.6°F | Ht 66.0 in | Wt 169.0 lb

## 2018-09-20 DIAGNOSIS — Z0000 Encounter for general adult medical examination without abnormal findings: Secondary | ICD-10-CM

## 2018-09-20 DIAGNOSIS — E782 Mixed hyperlipidemia: Secondary | ICD-10-CM

## 2018-09-20 DIAGNOSIS — E042 Nontoxic multinodular goiter: Secondary | ICD-10-CM | POA: Diagnosis not present

## 2018-09-20 DIAGNOSIS — Z8619 Personal history of other infectious and parasitic diseases: Secondary | ICD-10-CM | POA: Diagnosis not present

## 2018-09-20 DIAGNOSIS — I10 Essential (primary) hypertension: Secondary | ICD-10-CM

## 2018-09-20 LAB — CBC
HCT: 39.2 % (ref 36.0–46.0)
Hemoglobin: 12.9 g/dL (ref 12.0–15.0)
MCHC: 33 g/dL (ref 30.0–36.0)
MCV: 87.7 fl (ref 78.0–100.0)
Platelets: 187 10*3/uL (ref 150.0–400.0)
RBC: 4.47 Mil/uL (ref 3.87–5.11)
RDW: 14.8 % (ref 11.5–15.5)
WBC: 5.6 10*3/uL (ref 4.0–10.5)

## 2018-09-20 LAB — TSH: TSH: 1.71 u[IU]/mL (ref 0.35–4.50)

## 2018-09-20 NOTE — Patient Instructions (Signed)
Health Maintenance, Female Adopting a healthy lifestyle and getting preventive care are important in promoting health and wellness. Ask your health care provider about:  The right schedule for you to have regular tests and exams.  Things you can do on your own to prevent diseases and keep yourself healthy. What should I know about diet, weight, and exercise? Eat a healthy diet   Eat a diet that includes plenty of vegetables, fruits, low-fat dairy products, and lean protein.  Do not eat a lot of foods that are high in solid fats, added sugars, or sodium. Maintain a healthy weight Body mass index (BMI) is used to identify weight problems. It estimates body fat based on height and weight. Your health care provider can help determine your BMI and help you achieve or maintain a healthy weight. Get regular exercise Get regular exercise. This is one of the most important things you can do for your health. Most adults should:  Exercise for at least 150 minutes each week. The exercise should increase your heart rate and make you sweat (moderate-intensity exercise).  Do strengthening exercises at least twice a week. This is in addition to the moderate-intensity exercise.  Spend less time sitting. Even light physical activity can be beneficial. Watch cholesterol and blood lipids Have your blood tested for lipids and cholesterol at 76 years of age, then have this test every 5 years. Have your cholesterol levels checked more often if:  Your lipid or cholesterol levels are high.  You are older than 76 years of age.  You are at high risk for heart disease. What should I know about cancer screening? Depending on your health history and family history, you may need to have cancer screening at various ages. This may include screening for:  Breast cancer.  Cervical cancer.  Colorectal cancer.  Skin cancer.  Lung cancer. What should I know about heart disease, diabetes, and high blood  pressure? Blood pressure and heart disease  High blood pressure causes heart disease and increases the risk of stroke. This is more likely to develop in people who have high blood pressure readings, are of African descent, or are overweight.  Have your blood pressure checked: ? Every 3-5 years if you are 18-39 years of age. ? Every year if you are 40 years old or older. Diabetes Have regular diabetes screenings. This checks your fasting blood sugar level. Have the screening done:  Once every three years after age 40 if you are at a normal weight and have a low risk for diabetes.  More often and at a younger age if you are overweight or have a high risk for diabetes. What should I know about preventing infection? Hepatitis B If you have a higher risk for hepatitis B, you should be screened for this virus. Talk with your health care provider to find out if you are at risk for hepatitis B infection. Hepatitis C Testing is recommended for:  Everyone born from 1945 through 1965.  Anyone with known risk factors for hepatitis C. Sexually transmitted infections (STIs)  Get screened for STIs, including gonorrhea and chlamydia, if: ? You are sexually active and are younger than 76 years of age. ? You are older than 76 years of age and your health care provider tells you that you are at risk for this type of infection. ? Your sexual activity has changed since you were last screened, and you are at increased risk for chlamydia or gonorrhea. Ask your health care provider if   you are at risk.  Ask your health care provider about whether you are at high risk for HIV. Your health care provider may recommend a prescription medicine to help prevent HIV infection. If you choose to take medicine to prevent HIV, you should first get tested for HIV. You should then be tested every 3 months for as long as you are taking the medicine. Pregnancy  If you are about to stop having your period (premenopausal) and  you may become pregnant, seek counseling before you get pregnant.  Take 400 to 800 micrograms (mcg) of folic acid every day if you become pregnant.  Ask for birth control (contraception) if you want to prevent pregnancy. Osteoporosis and menopause Osteoporosis is a disease in which the bones lose minerals and strength with aging. This can result in bone fractures. If you are 65 years old or older, or if you are at risk for osteoporosis and fractures, ask your health care provider if you should:  Be screened for bone loss.  Take a calcium or vitamin D supplement to lower your risk of fractures.  Be given hormone replacement therapy (HRT) to treat symptoms of menopause. Follow these instructions at home: Lifestyle  Do not use any products that contain nicotine or tobacco, such as cigarettes, e-cigarettes, and chewing tobacco. If you need help quitting, ask your health care provider.  Do not use street drugs.  Do not share needles.  Ask your health care provider for help if you need support or information about quitting drugs. Alcohol use  Do not drink alcohol if: ? Your health care provider tells you not to drink. ? You are pregnant, may be pregnant, or are planning to become pregnant.  If you drink alcohol: ? Limit how much you use to 0-1 drink a day. ? Limit intake if you are breastfeeding.  Be aware of how much alcohol is in your drink. In the [place] one drink equals one 12 oz bottle of beer (355 mL), one 5 oz glass of wine (148 mL), or one 1 oz glass of hard liquor (44 mL). General instructions  Schedule regular health, dental, and eye exams.  Stay current with your vaccines.  Tell your health care provider if: ? You often feel depressed. ? You have ever been abused or do not feel safe at home. Summary  Adopting a healthy lifestyle and getting preventive care are important in promoting health and wellness.  Follow your health care provider's instructions about healthy  diet, exercising, and getting tested or screened for diseases.  Follow your health care provider's instructions on monitoring your cholesterol and blood pressure. This information is not intended to replace advice given to you by your health care provider. Make sure you discuss any questions you have with your health care provider. Document Released: 09/23/2010 Document Revised: 03/03/2018 Document Reviewed: 03/03/2018 Elsevier Patient Education  2020 Elsevier Inc.  

## 2018-09-20 NOTE — Progress Notes (Signed)
Subjective:   Patient ID: [name], female    DOB: 08/09/1940, 78 y.o.   MRN: [id number]  HPI Here for medicare wellness and physical, no new complaints. Please see A/P for status and treatment of chronic medical problems.   Diet: heart healthy  Physical activity: sedentary Depression/mood screen: negative Hearing: intact to whispered voice Visual acuity: grossly normal with lens, performs annual eye exam  ADLs: capable Fall risk: none Home safety: good Cognitive evaluation: intact to orientation, naming, recall and repetition EOL planning: adv directives discussed    Office Visit from 09/20/2018 in [facility]  PHQ-2 Total Score  0      I have personally reviewed and have noted 1. The patient's medical and social history - reviewed today no changes 2. Their use of alcohol, tobacco or illicit drugs 3. Their current medications and supplements 4. The patient's functional ability including ADL's, fall risks, home safety risks and hearing or visual impairment. 5. Diet and physical activities 6. Evidence for depression or mood disorders 7. Care team reviewed and updated  Patient Care Team: Myrlene Brokerrawford, [name] A, MD as PCP - General (Internal Medicine) Past Medical History:  Diagnosis Date  . Chronic hepatitis C (HCC) 2014   DID HARVONI AND RIBIVORIN [place]   . Headache    MIGRAINE AURA NO HEADACHE  . History of blood transfusion 1990   AFTER GI BLEED  . Hyperlipidemia   . Hypertension   . Multinodular goiter    SHRANK AWAY 25 YRS AGO  . Osteoarthritis    OA  . Palpitations    PACs & PVCs  . Pneumonia YRS AGO   Past Surgical History:  Procedure Laterality Date  . TOTAL KNEE ARTHROPLASTY Left 04/28/2016   Procedure: LEFT TOTAL KNEE ARTHROPLASTY;  Surgeon: Ollen GrossAluisio, Frank, MD;  Location: [facility];  Service: Orthopedics;  Laterality: Left;  . TOTAL KNEE ARTHROPLASTY Right 03/29/2018   Procedure: RIGHT TOTAL KNEE ARTHROPLASTY;  Surgeon: Ollen GrossAluisio,  Frank, MD;  Location: [facility];  Service: Orthopedics;  Laterality: Right;  50min  . TUBAL LIGATION     Family History  Problem Relation Age of Onset  . Heart disease Mother   . Heart disease Father   . COPD Father    Review of Systems  Constitutional: Negative.   HENT: Negative.   Eyes: Negative.   Respiratory: Negative for cough, chest tightness and shortness of breath.   Cardiovascular: Negative for chest pain, palpitations and leg swelling.  Gastrointestinal: Negative for abdominal distention, abdominal pain, constipation, diarrhea, nausea and vomiting.  Musculoskeletal: Negative.   Skin: Negative.   Neurological: Negative.   Psychiatric/Behavioral: Negative.     Objective:  Physical Exam Constitutional:      Appearance: She is well-developed.  HENT:     Head: Normocephalic and atraumatic.  Neck:     Musculoskeletal: Normal range of motion.  Cardiovascular:     Rate and Rhythm: Normal rate and regular rhythm.  Pulmonary:     Effort: Pulmonary effort is normal. No respiratory distress.     Breath sounds: Normal breath sounds. No wheezing or rales.  Abdominal:     General: Bowel sounds are normal. There is no distension.     Palpations: Abdomen is soft.     Tenderness: There is no abdominal tenderness. There is no rebound.  Skin:    General: Skin is warm and dry.  Neurological:     Mental Status: She is alert and oriented to person, place, and time.  Coordination: Coordination normal.     Vitals:   09/20/18 1433  BP: 110/80  Pulse: 83  Temp: 97.6 F (36.4 C)  TempSrc: Oral  SpO2: 97%  Weight: 169 lb (76.7 kg)  Height: 5\' 6"  (1.676 m)    Assessment & Plan:

## 2018-09-21 DIAGNOSIS — I10 Essential (primary) hypertension: Secondary | ICD-10-CM | POA: Insufficient documentation

## 2018-09-21 DIAGNOSIS — Z0000 Encounter for general adult medical examination without abnormal findings: Secondary | ICD-10-CM | POA: Insufficient documentation

## 2018-09-21 LAB — LIPID PANEL
Cholesterol: 180 mg/dL (ref 0–200)
HDL: 48.4 mg/dL (ref >39.00)
LDL Cholesterol: 105 mg/dL — ABNORMAL HIGH (ref 0–99)
NonHDL: 131.12
Total CHOL/HDL Ratio: 4
Triglycerides: 129 mg/dL (ref 0.0–149.0)
VLDL: 25.8 mg/dL (ref 0.0–40.0)

## 2018-09-21 MED ORDER — CIPROFLOXACIN HCL 500 MG PO TABS: 500.0000 mg | ORAL_TABLET | Freq: Two times a day (BID) | ORAL | 2 refills | Status: DC

## 2018-09-21 NOTE — Assessment & Plan Note (Signed)
Checking TSH and adjust as needed her synthroid 50 mcg daily.  

## 2018-09-21 NOTE — Assessment & Plan Note (Addendum)
Flu shot counseled. Pneumonia complete per patient. Shingrix counseled. Tetanus due 2021. Colonoscopy last 2015 and no further due. Mammogram due 2021, pap smear aged out and dexa due 2020 declines today with covid. Counseled about sun safety and mole surveillance. Counseled about the dangers of distracted driving. Given 10 year screening recommendations.

## 2018-09-21 NOTE — Assessment & Plan Note (Signed)
Taking amlodipine 2.5 mg daily. Recent CMP without indication for change.

## 2018-09-21 NOTE — Assessment & Plan Note (Signed)
Checking lipid panel and adjust pravastatin 20 mg daily as needed. 

## 2018-12-31 ENCOUNTER — Ambulatory Visit (INDEPENDENT_AMBULATORY_CARE_PROVIDER_SITE_OTHER): Payer: Medicare Other

## 2018-12-31 DIAGNOSIS — Z23 Encounter for immunization: Secondary | ICD-10-CM | POA: Diagnosis not present

## 2019-01-04 ENCOUNTER — Other Ambulatory Visit: Payer: Self-pay | Admitting: Internal Medicine

## 2019-04-06 ENCOUNTER — Ambulatory Visit: Payer: Medicare Other | Attending: Internal Medicine

## 2019-04-06 DIAGNOSIS — Z23 Encounter for immunization: Secondary | ICD-10-CM | POA: Insufficient documentation

## 2019-04-06 NOTE — Progress Notes (Signed)
   Covid-19 Vaccination Clinic  Name:  [name]    MRN: [id number] DOB: 10/27/1940  04/06/2019  [name] was observed post Covid-19 immunization for 15 minutes without incidence. She was provided with Vaccine Information Sheet and instruction to access the V-Safe system.   [name] was instructed to call 911 with any severe reactions post vaccine: Marland Kitchen Difficulty breathing  . Swelling of your face and throat  . A fast heartbeat  . A bad rash all over your body  . Dizziness and weakness    Immunizations Administered    Name Date Dose VIS Date Route   Pfizer COVID-19 Vaccine 04/06/2019  8:58 AM 0.3 mL 03/04/2019 Intramuscular   Manufacturer: [employer], [employer]   Lot: [id number]   NDC: [address]-1

## 2019-04-26 ENCOUNTER — Ambulatory Visit: Payer: Medicare PPO | Attending: Internal Medicine

## 2019-04-26 DIAGNOSIS — Z23 Encounter for immunization: Secondary | ICD-10-CM | POA: Insufficient documentation

## 2019-04-26 NOTE — Progress Notes (Signed)
   Covid-19 Vaccination Clinic  Name:  [name]    MRN: [id number] DOB: [birth year]  04/26/2019  [name] was observed post Covid-19 immunization for 15 minutes without incidence. She was provided with Vaccine Information Sheet and instruction to access the V-Safe system.   [name] was instructed to call 911 with any severe reactions post vaccine: Marland Kitchen Difficulty breathing  . Swelling of your face and throat  . A fast heartbeat  . A bad rash all over your body  . Dizziness and weakness    Immunizations Administered    Name Date Dose VIS Date Route   Pfizer COVID-19 Vaccine 04/26/2019  8:51 AM 0.3 mL 03/04/2019 Intramuscular   Manufacturer: [employer], [employer]   Lot: [id number]   NDC: [address]-4

## 2019-05-06 ENCOUNTER — Other Ambulatory Visit: Payer: Self-pay

## 2019-05-06 NOTE — Telephone Encounter (Signed)
I will need to call pt to see if she has been taking the prescription. Last fill for all was years ago.

## 2019-05-06 NOTE — Telephone Encounter (Signed)
**  Patient calling and states that she does not need the metoprolol at this time, but new pharmacy will be sending a request for these 3 prescriptions. States that she does need the other 2 at this time.**   Medication Refill - Medication: levothyroxine (SYNTHROID, LEVOTHROID) 50 MCG tablet pravastatin (PRAVACHOL) 20 MG tablet metoprolol succinate (TOPROL-XL) 50 MG 24 hr tablet   Has the patient contacted their pharmacy? Yes.   (Agent: If no, request that the patient contact the pharmacy for the refill.) (Agent: If yes, when and what did the pharmacy advise?)  Preferred Pharmacy (with phone number or street name): [facility] - [place], [place] - [address]  Agent: Please be advised that RX refills may take up to 3 business days. We ask that you follow-up with your pharmacy.

## 2019-05-10 ENCOUNTER — Other Ambulatory Visit: Payer: Self-pay

## 2019-05-10 MED ORDER — LEVOTHYROXINE SODIUM 50 MCG PO TABS: 50.0000 ug | ORAL_TABLET | Freq: Every day | ORAL | 1 refills | Status: DC

## 2019-05-10 MED ORDER — PRAVASTATIN SODIUM 20 MG PO TABS: 20.0000 mg | ORAL_TABLET | Freq: Every day | ORAL | 1 refills | Status: DC

## 2019-05-10 MED ORDER — METOPROLOL SUCCINATE ER 50 MG PO TB24: 50.0000 mg | ORAL_TABLET | Freq: Every day | ORAL | 1 refills | Status: DC

## 2019-05-10 NOTE — Telephone Encounter (Signed)
Pt contacted and she stated that the bottles she had did state Dr. Hyman Hopes and that may be why we did not see in our record.   Al Corpus has been sent as requested.

## 2019-05-16 ENCOUNTER — Other Ambulatory Visit: Payer: Self-pay

## 2019-05-16 MED ORDER — PRAVASTATIN SODIUM 20 MG PO TABS: 20.0000 mg | ORAL_TABLET | Freq: Every day | ORAL | 1 refills | Status: DC

## 2019-05-16 MED ORDER — METOPROLOL SUCCINATE ER 50 MG PO TB24: 50.0000 mg | ORAL_TABLET | Freq: Every day | ORAL | 1 refills | Status: DC

## 2019-05-16 MED ORDER — LEVOTHYROXINE SODIUM 50 MCG PO TABS: 50.0000 ug | ORAL_TABLET | Freq: Every day | ORAL | 1 refills | Status: DC

## 2019-05-18 DIAGNOSIS — D2272 Melanocytic nevi of left lower limb, including hip: Secondary | ICD-10-CM | POA: Diagnosis not present

## 2019-05-18 DIAGNOSIS — D2262 Melanocytic nevi of left upper limb, including shoulder: Secondary | ICD-10-CM | POA: Diagnosis not present

## 2019-05-18 DIAGNOSIS — D2261 Melanocytic nevi of right upper limb, including shoulder: Secondary | ICD-10-CM | POA: Diagnosis not present

## 2019-05-18 DIAGNOSIS — D2271 Melanocytic nevi of right lower limb, including hip: Secondary | ICD-10-CM | POA: Diagnosis not present

## 2019-05-18 DIAGNOSIS — L570 Actinic keratosis: Secondary | ICD-10-CM | POA: Diagnosis not present

## 2019-05-18 DIAGNOSIS — L821 Other seborrheic keratosis: Secondary | ICD-10-CM | POA: Diagnosis not present

## 2019-05-18 DIAGNOSIS — D1801 Hemangioma of skin and subcutaneous tissue: Secondary | ICD-10-CM | POA: Diagnosis not present

## 2019-05-18 DIAGNOSIS — D044 Carcinoma in situ of skin of scalp and neck: Secondary | ICD-10-CM | POA: Diagnosis not present

## 2019-05-18 DIAGNOSIS — D485 Neoplasm of uncertain behavior of skin: Secondary | ICD-10-CM | POA: Diagnosis not present

## 2019-06-03 DIAGNOSIS — D044 Carcinoma in situ of skin of scalp and neck: Secondary | ICD-10-CM | POA: Diagnosis not present

## 2019-06-03 DIAGNOSIS — L821 Other seborrheic keratosis: Secondary | ICD-10-CM | POA: Diagnosis not present

## 2019-07-21 DIAGNOSIS — Z471 Aftercare following joint replacement surgery: Secondary | ICD-10-CM | POA: Diagnosis not present

## 2019-07-21 DIAGNOSIS — Z96652 Presence of left artificial knee joint: Secondary | ICD-10-CM | POA: Diagnosis not present

## 2019-07-21 DIAGNOSIS — Z96651 Presence of right artificial knee joint: Secondary | ICD-10-CM | POA: Diagnosis not present

## 2019-07-21 DIAGNOSIS — Z96653 Presence of artificial knee joint, bilateral: Secondary | ICD-10-CM | POA: Diagnosis not present

## 2019-08-17 DIAGNOSIS — H2513 Age-related nuclear cataract, bilateral: Secondary | ICD-10-CM | POA: Diagnosis not present

## 2019-08-17 DIAGNOSIS — H5203 Hypermetropia, bilateral: Secondary | ICD-10-CM | POA: Diagnosis not present

## 2019-08-23 ENCOUNTER — Ambulatory Visit: Payer: Medicare PPO | Admitting: Sports Medicine

## 2019-08-23 ENCOUNTER — Ambulatory Visit
Admission: RE | Admit: 2019-08-23 | Discharge: 2019-08-23 | Disposition: A | Discharge status: Home / Self Care | Payer: Medicare PPO | Source: Ambulatory Visit | Attending: Sports Medicine | Admitting: Sports Medicine

## 2019-08-23 ENCOUNTER — Other Ambulatory Visit: Payer: Self-pay

## 2019-08-23 VITALS — BP 110/72 | Ht 66.0 in | Wt 170.0 lb

## 2019-08-23 DIAGNOSIS — M1611 Unilateral primary osteoarthritis, right hip: Secondary | ICD-10-CM | POA: Diagnosis not present

## 2019-08-23 DIAGNOSIS — M25551 Pain in right hip: Secondary | ICD-10-CM

## 2019-08-24 ENCOUNTER — Encounter: Payer: Self-pay | Admitting: Sports Medicine

## 2019-08-24 NOTE — Progress Notes (Signed)
   Subjective:    Patient ID: [name], female    DOB: [birth year], 79 y.o.   MRN: [id number]  HPI chief complaint: Right hip pain  Katrine comes in today complaining of right hip pain that has been present for about 6 weeks without any known trauma.  She endorses pain in the posterior aspect of her hip which occasionally will radiate into the groin.  Her pain is most noticeable when first getting up either first thing in the morning or from a seated position.  Once she gets moving her pain improves.  She enjoys walking about 4 miles a day and endorses pain for about the first three quarters of a mile but then her pain improves and she is able to complete her walk without any difficulty.  She denies any stiffness.  No radiating pain down the leg.  No numbness or tingling.  She has a history of sciatica but states her current hip pain is different in nature than what she experienced with that in the past.  Inner medical history reviewed Medications reviewed Allergies reviewed    Review of Systems    As above Objective:   Physical Exam  Well-developed, well-nourished.  No acute distress.  Awake alert and oriented x3.  Vital signs reviewed  Right hip: Smooth painless hip range of motion with a negative logroll.  There is no tenderness to palpation over the greater trochanteric bursa but there is tenderness to palpation along both the gluteus medius and the piriformis.  Positive Trendelenburg when standing on the right leg, negative Trendelenburg when standing on the left leg.  Negative straight leg raise.  Neurovascularly intact distally.  X-rays of the right hip including an AP pelvis and lateral right hip show some mild degenerative changes but not marked.  Nothing acute.       Assessment & Plan:   Right hip pain likely secondary to pelvic stabilizer weakness versus mild DJD  Patient has definite hip weakness on today's exam.  We are going to give her some exercises to do at home  including hip abductor exercises and pelvic stabilizer exercises.  Although her x-ray does not show advanced arthritis, her history is suggestive of arthritis possibly being responsible for some of her symptoms.  I would like to see her back in the office in 6 weeks after she has improved her hip weakness to see how much of her pain is resolved.  If symptoms persist at that time then consider diagnostic/therapeutic intra-articular right hip injection.  If she finds it difficult to do her home exercises on her own, then I would suggest formal physical therapy.  She had excellent results in the past working with Eulis Foster at [facility].

## 2019-09-21 ENCOUNTER — Other Ambulatory Visit: Payer: Self-pay

## 2019-09-21 ENCOUNTER — Ambulatory Visit (INDEPENDENT_AMBULATORY_CARE_PROVIDER_SITE_OTHER): Payer: Medicare PPO | Admitting: Internal Medicine

## 2019-09-21 ENCOUNTER — Encounter: Payer: Self-pay | Admitting: Internal Medicine

## 2019-09-21 VITALS — BP 124/78 | HR 66 | Temp 97.9°F | Ht 66.0 in | Wt 171.0 lb

## 2019-09-21 DIAGNOSIS — Z0000 Encounter for general adult medical examination without abnormal findings: Secondary | ICD-10-CM

## 2019-09-21 DIAGNOSIS — E042 Nontoxic multinodular goiter: Secondary | ICD-10-CM | POA: Diagnosis not present

## 2019-09-21 DIAGNOSIS — E782 Mixed hyperlipidemia: Secondary | ICD-10-CM | POA: Diagnosis not present

## 2019-09-21 DIAGNOSIS — I10 Essential (primary) hypertension: Secondary | ICD-10-CM | POA: Diagnosis not present

## 2019-09-21 LAB — CBC
HCT: 39.7 % (ref 36.0–46.0)
Hemoglobin: 13.4 g/dL (ref 12.0–15.0)
MCHC: 33.6 g/dL (ref 30.0–36.0)
MCV: 90.1 fl (ref 78.0–100.0)
Platelets: 157 10*3/uL (ref 150.0–400.0)
RBC: 4.41 Mil/uL (ref 3.87–5.11)
RDW: 13.8 % (ref 11.5–15.5)
WBC: 4.7 10*3/uL (ref 4.0–10.5)

## 2019-09-21 LAB — COMPREHENSIVE METABOLIC PANEL
ALT: 13 U/L (ref 0–35)
AST: 16 U/L (ref 0–37)
Albumin: 4.3 g/dL (ref 3.5–5.2)
Alkaline Phosphatase: 79 U/L (ref 39–117)
BUN: 13 mg/dL (ref 6–23)
CO2: 25 mEq/L (ref 19–32)
Calcium: 9.3 mg/dL (ref 8.4–10.5)
Chloride: 106 mEq/L (ref 96–112)
Creatinine, Ser: 0.66 mg/dL (ref 0.40–1.20)
GFR: 86.9 mL/min (ref >60.00)
Glucose, Bld: 100 mg/dL — ABNORMAL HIGH (ref 70–99)
Potassium: 4 mEq/L (ref 3.5–5.1)
Sodium: 138 mEq/L (ref 135–145)
Total Bilirubin: 0.8 mg/dL (ref 0.2–1.2)
Total Protein: 6.5 g/dL (ref 6.0–8.3)

## 2019-09-21 LAB — LIPID PANEL
Cholesterol: 169 mg/dL (ref 0–200)
HDL: 48.7 mg/dL (ref >39.00)
LDL Cholesterol: 103 mg/dL — ABNORMAL HIGH (ref 0–99)
NonHDL: 120.64
Total CHOL/HDL Ratio: 3
Triglycerides: 86 mg/dL (ref 0.0–149.0)
VLDL: 17.2 mg/dL (ref 0.0–40.0)

## 2019-09-21 LAB — TSH: TSH: 3.36 u[IU]/mL (ref 0.35–4.50)

## 2019-09-21 MED ORDER — ALBUTEROL SULFATE HFA 108 (90 BASE) MCG/ACT IN AERS: INHALATION_SPRAY | RESPIRATORY_TRACT | 6 refills | Status: AC

## 2019-09-21 MED ORDER — PRAVASTATIN SODIUM 20 MG PO TABS: 20.0000 mg | ORAL_TABLET | Freq: Every day | ORAL | 3 refills | Status: DC

## 2019-09-21 MED ORDER — METOPROLOL SUCCINATE ER 50 MG PO TB24: 50.0000 mg | ORAL_TABLET | Freq: Every day | ORAL | 3 refills | Status: DC

## 2019-09-21 MED ORDER — CIPROFLOXACIN HCL 500 MG PO TABS: 500.0000 mg | ORAL_TABLET | Freq: Two times a day (BID) | ORAL | 2 refills | Status: DC

## 2019-09-21 NOTE — Patient Instructions (Signed)
Health Maintenance, Female Adopting a healthy lifestyle and getting preventive care are important in promoting health and wellness. Ask your health care provider about:  The right schedule for you to have regular tests and exams.  Things you can do on your own to prevent diseases and keep yourself healthy. What should I know about diet, weight, and exercise? Eat a healthy diet   Eat a diet that includes plenty of vegetables, fruits, low-fat dairy products, and lean protein.  Do not eat a lot of foods that are high in solid fats, added sugars, or sodium. Maintain a healthy weight Body mass index (BMI) is used to identify weight problems. It estimates body fat based on height and weight. Your health care provider can help determine your BMI and help you achieve or maintain a healthy weight. Get regular exercise Get regular exercise. This is one of the most important things you can do for your health. Most adults should:  Exercise for at least 150 minutes each week. The exercise should increase your heart rate and make you sweat (moderate-intensity exercise).  Do strengthening exercises at least twice a week. This is in addition to the moderate-intensity exercise.  Spend less time sitting. Even light physical activity can be beneficial. Watch cholesterol and blood lipids Have your blood tested for lipids and cholesterol at 77 years of age, then have this test every 5 years. Have your cholesterol levels checked more often if:  Your lipid or cholesterol levels are high.  You are older than 77 years of age.  You are at high risk for heart disease. What should I know about cancer screening? Depending on your health history and family history, you may need to have cancer screening at various ages. This may include screening for:  Breast cancer.  Cervical cancer.  Colorectal cancer.  Skin cancer.  Lung cancer. What should I know about heart disease, diabetes, and high blood  pressure? Blood pressure and heart disease  High blood pressure causes heart disease and increases the risk of stroke. This is more likely to develop in people who have high blood pressure readings, are of African descent, or are overweight.  Have your blood pressure checked: ? Every 3-5 years if you are 18-39 years of age. ? Every year if you are 40 years old or older. Diabetes Have regular diabetes screenings. This checks your fasting blood sugar level. Have the screening done:  Once every three years after age 40 if you are at a normal weight and have a low risk for diabetes.  More often and at a younger age if you are overweight or have a high risk for diabetes. What should I know about preventing infection? Hepatitis B If you have a higher risk for hepatitis B, you should be screened for this virus. Talk with your health care provider to find out if you are at risk for hepatitis B infection. Hepatitis C Testing is recommended for:  Everyone born from 1945 through 1965.  Anyone with known risk factors for hepatitis C. Sexually transmitted infections (STIs)  Get screened for STIs, including gonorrhea and chlamydia, if: ? You are sexually active and are younger than 77 years of age. ? You are older than 77 years of age and your health care provider tells you that you are at risk for this type of infection. ? Your sexual activity has changed since you were last screened, and you are at increased risk for chlamydia or gonorrhea. Ask your health care provider if   you are at risk.  Ask your health care provider about whether you are at high risk for HIV. Your health care provider may recommend a prescription medicine to help prevent HIV infection. If you choose to take medicine to prevent HIV, you should first get tested for HIV. You should then be tested every 3 months for as long as you are taking the medicine. Pregnancy  If you are about to stop having your period (premenopausal) and  you may become pregnant, seek counseling before you get pregnant.  Take 400 to 800 micrograms (mcg) of folic acid every day if you become pregnant.  Ask for birth control (contraception) if you want to prevent pregnancy. Osteoporosis and menopause Osteoporosis is a disease in which the bones lose minerals and strength with aging. This can result in bone fractures. If you are 65 years old or older, or if you are at risk for osteoporosis and fractures, ask your health care provider if you should:  Be screened for bone loss.  Take a calcium or vitamin D supplement to lower your risk of fractures.  Be given hormone replacement therapy (HRT) to treat symptoms of menopause. Follow these instructions at home: Lifestyle  Do not use any products that contain nicotine or tobacco, such as cigarettes, e-cigarettes, and chewing tobacco. If you need help quitting, ask your health care provider.  Do not use street drugs.  Do not share needles.  Ask your health care provider for help if you need support or information about quitting drugs. Alcohol use  Do not drink alcohol if: ? Your health care provider tells you not to drink. ? You are pregnant, may be pregnant, or are planning to become pregnant.  If you drink alcohol: ? Limit how much you use to 0-1 drink a day. ? Limit intake if you are breastfeeding.  Be aware of how much alcohol is in your drink. In the [place] one drink equals one 12 oz bottle of beer (355 mL), one 5 oz glass of wine (148 mL), or one 1 oz glass of hard liquor (44 mL). General instructions  Schedule regular health, dental, and eye exams.  Stay current with your vaccines.  Tell your health care provider if: ? You often feel depressed. ? You have ever been abused or do not feel safe at home. Summary  Adopting a healthy lifestyle and getting preventive care are important in promoting health and wellness.  Follow your health care provider's instructions about healthy  diet, exercising, and getting tested or screened for diseases.  Follow your health care provider's instructions on monitoring your cholesterol and blood pressure. This information is not intended to replace advice given to you by your health care provider. Make sure you discuss any questions you have with your health care provider. Document Revised: 03/03/2018 Document Reviewed: 03/03/2018 Elsevier Patient Education  2020 Elsevier Inc.  

## 2019-09-21 NOTE — Progress Notes (Signed)
Subjective:   Patient ID: [name], female    DOB: 1940-12-21, 79 y.o.   MRN: [id number]  HPI Here for medicare wellness and physical, no new complaints. Please see A/P for status and treatment of chronic medical problems.   Diet: heart healthy Physical activity: walks daily Depression/mood screen: negative Hearing: intact to whispered voice Visual acuity: grossly normal with lens, performs annual eye exam  ADLs: capable Fall risk: none Home safety: good Cognitive evaluation: intact to orientation, naming, recall and repetition EOL planning: adv directives discussed    Office Visit from 09/21/2019 in [facility] Total Score 0      I have personally reviewed and have noted 1. The patient's medical and social history - reviewed today no changes 2. Their use of alcohol, tobacco or illicit drugs 3. Their current medications and supplements 4. The patient's functional ability including ADL's, fall risks, home safety risks and hearing or visual impairment. 5. Diet and physical activities 6. Evidence for depression or mood disorders 7. Care team reviewed and updated  Patient Care Team: Myrlene Broker, MD as PCP - General (Internal Medicine) Past Medical History:  Diagnosis Date  . Chronic hepatitis C (HCC) 2014   DID HARVONI AND RIBIVORIN [place]   . Headache    MIGRAINE AURA NO HEADACHE  . History of blood transfusion 1990   AFTER GI BLEED  . Hyperlipidemia   . Hypertension   . Multinodular goiter    SHRANK AWAY 25 YRS AGO  . Osteoarthritis    OA  . Palpitations    PACs & PVCs  . Pneumonia YRS AGO   Past Surgical History:  Procedure Laterality Date  . TOTAL KNEE ARTHROPLASTY Left 04/28/2016   Procedure: LEFT TOTAL KNEE ARTHROPLASTY;  Surgeon: Ollen Gross, MD;  Location: [facility];  Service: Orthopedics;  Laterality: Left;  . TOTAL KNEE ARTHROPLASTY Right 03/29/2018   Procedure: RIGHT TOTAL KNEE ARTHROPLASTY;  Surgeon: Ollen Gross, MD;  Location: [facility];  Service: Orthopedics;  Laterality: Right;   . TUBAL LIGATION     Family History  Problem Relation Age of Onset  . Heart disease Mother   . Heart disease Father   . COPD Father     Review of Systems  Constitutional: Negative.   HENT: Negative.   Eyes: Negative.   Respiratory: Negative for cough, chest tightness and shortness of breath.   Cardiovascular: Negative for chest pain, palpitations and leg swelling.  Gastrointestinal: Negative for abdominal distention, abdominal pain, constipation, diarrhea, nausea and vomiting.  Musculoskeletal: Positive for arthralgias.  Skin: Negative.   Neurological: Negative.   Psychiatric/Behavioral: Negative.     Objective:  Physical Exam Constitutional:      Appearance: She is well-developed.  HENT:     Head: Normocephalic and atraumatic.  Cardiovascular:     Rate and Rhythm: Normal rate and regular rhythm.  Pulmonary:     Effort: Pulmonary effort is normal. No respiratory distress.     Breath sounds: Normal breath sounds. No wheezing or rales.  Abdominal:     General: Bowel sounds are normal. There is no distension.     Palpations: Abdomen is soft.     Tenderness: There is no abdominal tenderness. There is no rebound.  Musculoskeletal:     Cervical back: Normal range of motion.  Skin:    General: Skin is warm and dry.  Neurological:     Mental Status: She is alert and oriented to person, place,  and time.     Coordination: Coordination normal.     Vitals:   09/21/19 1020  BP: 124/78  Pulse: 66  Temp: 97.9 F (36.6 C)  TempSrc: Oral  SpO2: 96%  Weight: 171 lb (77.6 kg)  Height: 5\' 6"  (1.676 m)    This visit occurred during the SARS-CoV-2 public health emergency.  Safety protocols were in place, including screening questions prior to the visit, additional usage of staff PPE, and extensive cleaning of exam room while observing appropriate contact time as indicated for disinfecting solutions.    Assessment & Plan:

## 2019-09-22 MED ORDER — LEVOTHYROXINE SODIUM 50 MCG PO TABS: 50.0000 ug | ORAL_TABLET | Freq: Every day | ORAL | 3 refills | Status: DC

## 2019-09-22 NOTE — Assessment & Plan Note (Signed)
Checking lipid panel and adjust pravastatin 20 mg daily as needed. 

## 2019-09-22 NOTE — Assessment & Plan Note (Signed)
Checking TSH and adjust synthroid 50 mcg daily as needed. 

## 2019-09-22 NOTE — Assessment & Plan Note (Signed)
BP at goal on metoprolol and amlodipine, checking CMP and adjust as needed.

## 2019-09-22 NOTE — Assessment & Plan Note (Signed)
Flu shot due yearly. Covid-19 up to date. Pneumonia complete. Shingrix counseled. Tetanus due counseled. Colonoscopy aged out prior to recall. Mammogram aged out, pap smear aged out and dexa declines further has had in the past. Counseled about sun safety and mole surveillance. Counseled about the dangers of distracted driving. Given 10 year screening recommendations.

## 2019-10-04 ENCOUNTER — Ambulatory Visit: Payer: Medicare PPO | Admitting: Sports Medicine

## 2019-10-04 ENCOUNTER — Other Ambulatory Visit: Payer: Self-pay

## 2019-10-04 VITALS — BP 116/78 | Ht 66.0 in | Wt 170.0 lb

## 2019-10-04 DIAGNOSIS — M25551 Pain in right hip: Secondary | ICD-10-CM

## 2019-10-05 NOTE — Progress Notes (Signed)
   Subjective:    Patient ID: [name], female    DOB: [birth year], 79 y.o.   MRN: [id number]  HPI   [name] comes in today for follow-up on right hip pain.  She has not really noticed much improvement with her home exercise program.  She states that she has some days where her pain is minimal and other days where it is quite severe.  She does endorse pain when going from a seated to a standing position.  She has not noticed any stiffness.  Her pain remains diffuse around the hip.  She is experiencing pain in the anterior hip with radiating pain down the anterior thigh but she is also getting pain in the posterior hip.  Nonweightbearing x-rays of the right hip showed mild to moderate degenerative changes.    Review of Systems As above    Objective:   Physical Exam  Well-developed, well-nourished.  No acute distress.  Awake alert and oriented x3.  Vital signs reviewed.   Right hip: Smooth painless hip range of motion with a negative logroll.  There is no tenderness to palpation over the greater trochanteric bursa.  Evaluation of her gait still shows a slight Trendelenburg with a slightly externally rotated right foot.      Assessment & Plan:   Right hip pain likely secondary to osteoarthritis  I would like for the patient to see Dr. Lequita Halt.  She has seen him in the past for her knees.  Although her exam is fairly benign, her x-rays and history suggest osteoarthritis as her pain generator.  She may need weightbearing films or a diagnostic/therapeutic intra-articular hip injection but I will defer these decisions to Dr. Lequita Halt.  Patient will follow up with me as needed.

## 2019-10-24 DIAGNOSIS — M25551 Pain in right hip: Secondary | ICD-10-CM | POA: Insufficient documentation

## 2019-10-25 DIAGNOSIS — M25551 Pain in right hip: Secondary | ICD-10-CM | POA: Diagnosis not present

## 2019-11-09 DIAGNOSIS — L821 Other seborrheic keratosis: Secondary | ICD-10-CM | POA: Diagnosis not present

## 2019-11-09 DIAGNOSIS — Z85828 Personal history of other malignant neoplasm of skin: Secondary | ICD-10-CM | POA: Diagnosis not present

## 2019-12-07 DIAGNOSIS — M25551 Pain in right hip: Secondary | ICD-10-CM | POA: Diagnosis not present

## 2019-12-08 DIAGNOSIS — M25551 Pain in right hip: Secondary | ICD-10-CM | POA: Diagnosis not present

## 2019-12-15 DIAGNOSIS — M25551 Pain in right hip: Secondary | ICD-10-CM | POA: Diagnosis not present

## 2019-12-20 DIAGNOSIS — M25551 Pain in right hip: Secondary | ICD-10-CM | POA: Diagnosis not present

## 2019-12-22 DIAGNOSIS — M25551 Pain in right hip: Secondary | ICD-10-CM | POA: Diagnosis not present

## 2019-12-26 DIAGNOSIS — M25551 Pain in right hip: Secondary | ICD-10-CM | POA: Diagnosis not present

## 2019-12-29 DIAGNOSIS — M25551 Pain in right hip: Secondary | ICD-10-CM | POA: Diagnosis not present

## 2020-01-02 DIAGNOSIS — M25551 Pain in right hip: Secondary | ICD-10-CM | POA: Diagnosis not present

## 2020-01-21 ENCOUNTER — Ambulatory Visit (INDEPENDENT_AMBULATORY_CARE_PROVIDER_SITE_OTHER): Payer: Medicare PPO

## 2020-01-21 DIAGNOSIS — Z23 Encounter for immunization: Secondary | ICD-10-CM

## 2020-02-01 ENCOUNTER — Other Ambulatory Visit: Payer: Self-pay | Admitting: Internal Medicine

## 2020-02-13 ENCOUNTER — Other Ambulatory Visit: Payer: Self-pay | Admitting: Internal Medicine

## 2020-02-13 NOTE — Telephone Encounter (Signed)
amLODipine (NORVASC) 2.5 MG tablet [facility] - [place], [place] - [address] Phone:  [phone]  Fax:  [phone]     [facility] stating they need a new prescription written to be able to fill the medication

## 2020-02-24 NOTE — Telephone Encounter (Signed)
Requested medication was refilled on 02/21/20 

## 2020-06-08 ENCOUNTER — Encounter: Payer: Self-pay | Admitting: Internal Medicine

## 2020-06-08 ENCOUNTER — Ambulatory Visit: Payer: Medicare PPO | Admitting: Internal Medicine

## 2020-06-08 ENCOUNTER — Other Ambulatory Visit: Payer: Self-pay

## 2020-06-08 DIAGNOSIS — I10 Essential (primary) hypertension: Secondary | ICD-10-CM

## 2020-06-08 NOTE — Patient Instructions (Signed)
We will have you increase amlodipine to 5 mg (2 pills of the 2.5 mg) daily and let us know in 1-2 weeks if this is working.

## 2020-06-08 NOTE — Assessment & Plan Note (Signed)
EKG done and no change from prior. Increase amlodipine to 5 mg daily and keep metoprolol 50 mg daily. HR without room to increase metoprolol.

## 2020-06-08 NOTE — Progress Notes (Signed)
   Subjective:   Patient ID: [name], female    DOB: 07/31/1940, 80 y.o.   MRN: [id number]  HPI The patient is a 80 YO female coming in for blood pressure. Has been having elevated readings at home. BP running 140s mostly/80s-90. She has typically ran more like 120s/70s in the past. Denies changes to diet or lifestyle. Does exercise daily. Denies chest pains or SOB. Does take amlodipine 2.5 mg daily and metoprolol and denies missing doses. Family history of CAD and she wishes to avoid.  Review of Systems  Constitutional: Negative.   HENT: Negative.   Eyes: Negative.   Respiratory: Negative for cough, chest tightness and shortness of breath.   Cardiovascular: Negative for chest pain, palpitations and leg swelling.  Gastrointestinal: Negative for abdominal distention, abdominal pain, constipation, diarrhea, nausea and vomiting.  Musculoskeletal: Negative.   Skin: Negative.   Neurological: Negative.   Psychiatric/Behavioral: Negative.     Objective:  Physical Exam Constitutional:      Appearance: She is well-developed.  HENT:     Head: Normocephalic and atraumatic.  Cardiovascular:     Rate and Rhythm: Normal rate and regular rhythm.  Pulmonary:     Effort: Pulmonary effort is normal. No respiratory distress.     Breath sounds: Normal breath sounds. No wheezing or rales.  Abdominal:     General: Bowel sounds are normal. There is no distension.     Palpations: Abdomen is soft.     Tenderness: There is no abdominal tenderness. There is no rebound.  Musculoskeletal:     Cervical back: Normal range of motion.  Skin:    General: Skin is warm and dry.  Neurological:     Mental Status: She is alert and oriented to person, place, and time.     Coordination: Coordination normal.     Vitals:   06/08/20 1401  BP: 140/84  Pulse: 71  Resp: 18  Temp: 98.3 F (36.8 C)  TempSrc: Oral  SpO2: 98%  Weight: 170 lb (77.1 kg)  Height: 5\' 6"  (1.676 m)   EKG: Rate 52, axis left,  interval normal, sinus brady, LBBB, no st or t wave changes, no significant change from 2020  This visit occurred during the SARS-CoV-2 public health emergency.  Safety protocols were in place, including screening questions prior to the visit, additional usage of staff PPE, and extensive cleaning of exam room while observing appropriate contact time as indicated for disinfecting solutions.   Assessment & Plan:

## 2020-06-18 ENCOUNTER — Encounter: Payer: Self-pay | Admitting: Internal Medicine

## 2020-06-18 MED ORDER — AMLODIPINE BESYLATE 5 MG PO TABS: 5.0000 mg | ORAL_TABLET | Freq: Every day | ORAL | 3 refills | Status: DC

## 2020-06-18 NOTE — Addendum Note (Signed)
Addended by: Manuela Schwartz on: 06/18/2020 04:32 PM   Modules accepted: Orders

## 2020-06-22 ENCOUNTER — Encounter: Payer: Self-pay | Admitting: Internal Medicine

## 2020-06-22 DIAGNOSIS — I447 Left bundle-branch block, unspecified: Secondary | ICD-10-CM

## 2020-06-22 DIAGNOSIS — R9431 Abnormal electrocardiogram [ECG] [EKG]: Secondary | ICD-10-CM

## 2020-08-06 ENCOUNTER — Ambulatory Visit: Payer: Medicare PPO | Admitting: Interventional Cardiology

## 2020-08-06 ENCOUNTER — Encounter: Payer: Self-pay | Admitting: Interventional Cardiology

## 2020-08-06 ENCOUNTER — Other Ambulatory Visit: Payer: Self-pay

## 2020-08-06 VITALS — BP 104/74 | HR 87 | Ht 66.0 in | Wt 170.8 lb

## 2020-08-06 DIAGNOSIS — I491 Atrial premature depolarization: Secondary | ICD-10-CM | POA: Diagnosis not present

## 2020-08-06 DIAGNOSIS — I493 Ventricular premature depolarization: Secondary | ICD-10-CM | POA: Diagnosis not present

## 2020-08-06 DIAGNOSIS — I10 Essential (primary) hypertension: Secondary | ICD-10-CM

## 2020-08-06 DIAGNOSIS — I447 Left bundle-branch block, unspecified: Secondary | ICD-10-CM

## 2020-08-06 MED ORDER — METOPROLOL SUCCINATE ER 25 MG PO TB24
25.0000 mg | ORAL_TABLET | Freq: Every day | ORAL | 3 refills | Status: DC
Start: 2020-08-06 — End: 2021-02-11

## 2020-08-06 NOTE — Progress Notes (Signed)
Cardiology Office Note   Date:  08/06/2020   ID:  MYKALAH SAARI, DOB January 22, 1941, MRN [id number]  PCP:  Myrlene Broker, MD    No chief complaint on file.  Abnormal ECG  Wt Readings from Last 3 Encounters:  08/06/20 170 lb 12.8 oz (77.5 kg)  06/08/20 170 lb (77.1 kg)  10/04/19 170 lb (77.1 kg)       History of Present Illness: [name] is a 80 y.o. female who is being seen today for the evaluation of LBBB at the request of Myrlene Broker, *.  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 She also has multinodular goiter and chronic hepatitis C (s/p treatment - virus free at 6 months) as well as hyperlipidemia."  1990 GI bleed.  Had a transfusion.  She got Hep C from the transfusion.   The LBBB was new in 2022 compared to 2020.  She has had palpitations for years.  Mostly isolated beats for many years.    Denies : Chest pain. Dizziness. Leg edema. Nitroglycerin use. Orthopnea. Palpitations. Paroxysmal nocturnal dyspnea. Shortness of breath. Syncope.   She does a lot of gardening as well. Mild orthostatic sx.  Father with MI in his 19s.  Mother with CHF. Brother with SVT.    Past Medical History:  Diagnosis Date  . Chronic hepatitis C (HCC) 2014   DID HARVONI AND RIBIVORIN [place]   . Headache    MIGRAINE AURA NO HEADACHE  . History of blood transfusion 1990   AFTER GI BLEED  . Hyperlipidemia   . Hypertension   . Multinodular goiter    SHRANK AWAY 25 YRS AGO  . Osteoarthritis    OA  . Palpitations    PACs & PVCs  . Pneumonia YRS AGO    Past Surgical History:  Procedure Laterality Date  . TOTAL KNEE ARTHROPLASTY Left 04/28/2016   Procedure: LEFT TOTAL KNEE ARTHROPLASTY;  Surgeon: Ollen Gross, MD;  Location: [facility];  Service: Orthopedics;  Laterality: Left;  . TOTAL KNEE ARTHROPLASTY Right 03/29/2018   Procedure: RIGHT TOTAL KNEE ARTHROPLASTY;  Surgeon: Ollen Gross, MD;   Location: [facility];  Service: Orthopedics;  Laterality: Right;   . TUBAL LIGATION       Current Outpatient Medications  Medication Sig Dispense Refill  . albuterol (PROAIR HFA) 108 (90 Base) MCG/ACT inhaler INHALE 1-2 PUFFS INTO THE LUNGS EVERY 4 (FOUR) HOURS AS NEEDED FOR WHEEZING OR SHORTNESS OF BREATH. 8.5 g 6  . amLODipine (NORVASC) 5 MG tablet Take 1 tablet (5 mg total) by mouth daily. 90 tablet 3  . cholecalciferol (VITAMIN D) 1000 units tablet Take 1,000 Units by mouth daily.     Marland Kitchen levothyroxine (SYNTHROID) 50 MCG tablet Take 1 tablet (50 mcg total) by mouth daily before breakfast. 90 tablet 3  . metoprolol succinate (TOPROL-XL) 50 MG 24 hr tablet Take 1 tablet (50 mg total) by mouth daily. 90 tablet 3  . pravastatin (PRAVACHOL) 20 MG tablet Take 1 tablet (20 mg total) by mouth at bedtime. 90 tablet 3   No current facility-administered medications for this visit.    Allergies:   Meperidine hcl, Nitrofurantoin, Griseofulvin, and Penicillins    Social History:  The patient  reports that she has never smoked. She has never used smokeless tobacco. She reports current alcohol use. She reports that she does not use drugs.   Family  History:  The patient's family history includes COPD in her father; Heart disease in her father and mother.    ROS:  Please see the history of present illness.   Otherwise, review of systems are positive for chronic left leg edema since her left TKR.   All other systems are reviewed and negative.    PHYSICAL EXAM: VS:  BP 104/74   Pulse 87   Ht 5\' 6"  (1.676 m)   Wt 170 lb 12.8 oz (77.5 kg)   SpO2 94%   BMI 27.57 kg/m  , BMI Body mass index is 27.57 kg/m. GEN: Well nourished, well developed, in no acute distress  HEENT: normal  Neck: no JVD, carotid bruits, or masses Cardiac: RRR; no murmurs, rubs, or gallops,no edema  Respiratory:  clear to auscultation bilaterally, normal work of breathing GI: soft, nontender, nondistended, + BS MS: no  deformity or atrophy  Skin: warm and dry, no rash Neuro:  Strength and sensation are intact Psych: euthymic mood, full affect   EKG:   The ekg ordered today demonstrates sinus bradycardia, LBBB   Recent Labs: 09/21/2019: ALT 13; BUN 13; Creatinine, Ser 0.66; Hemoglobin 13.4; Platelets 157.0; Potassium 4.0; Sodium 138; TSH 3.36   Lipid Panel    Component Value Date/Time   CHOL 169 09/21/2019 1050   TRIG 86.0 09/21/2019 1050   HDL 48.70 09/21/2019 1050   CHOLHDL 3 09/21/2019 1050   VLDL 17.2 09/21/2019 1050   LDLCALC 103 (H) 09/21/2019 1050   LDLDIRECT 153.4 04/07/2007 0744     Other studies Reviewed: Additional studies/ records that were reviewed today with results demonstrating: .   ASSESSMENT AND PLAN:  1. Abnormal ECG: Fells well. Decrease metoprolol due to resting bradycardia to 25 mg daily. Will have to see if PACs, PVCs become more prominent. 2. LBBB: No associated sx.  I suspect this is an incidental finding.  I do not think there is any precipitating ischemic event to cause the left bundle branch block.  No indication for pacer.  3. HTN: The current medical regimen is effective;  continue present plan and medications. Continue amlodipine.     Current medicines are reviewed at length with the patient today.  The patient concerns regarding her medicines were addressed.  The following changes have been made:  No change  Labs/ tests ordered today include:  No orders of the defined types were placed in this encounter.   Recommend 150 minutes/week of aerobic exercise Low fat, low carb, high fiber diet recommended  Disposition:   FU in 6 months   Signed, 04/09/2007, MD  08/06/2020 2:03 PM    [facility] [address] [place], [place], [place]  [place] Phone: [phone]; Fax: [phone]

## 2020-08-06 NOTE — Patient Instructions (Signed)
Medication Instructions:  Your physician has recommended you make the following change in your medication: Decrease metoprolol succinate to 25 mg by mouth daily  *If you need a refill on your cardiac medications before your next appointment, please call your pharmacy*   Lab Work: none If you have labs (blood work) drawn today and your tests are completely normal, you will receive your results only by: Marland Kitchen MyChart Message (if you have MyChart) OR . A paper copy in the mail If you have any lab test that is abnormal or we need to change your treatment, we will call you to review the results.   Testing/Procedures: none   Follow-Up: At [facility], you and your health needs are our priority.  As part of our continuing mission to provide you with exceptional heart care, we have created designated Provider Care Teams.  These Care Teams include your primary Cardiologist (physician) and Advanced Practice Providers (APPs -  Physician Assistants and Nurse Practitioners) who all work together to provide you with the care you need, when you need it.  We recommend signing up for the patient portal called "MyChart".  Sign up information is provided on this After Visit Summary.  MyChart is used to connect with patients for Virtual Visits (Telemedicine).  Patients are able to view lab/test results, encounter notes, upcoming appointments, etc.  Non-urgent messages can be sent to your provider as well.   To learn more about what you can do with MyChart, go to [email or web address].    Your next appointment:   6 month(s)  The format for your next appointment:   In Person  Provider:   You may see Lance Muss, MD or one of the following Advanced Practice Providers on your designated Care Team:    Ronie Spies, PA-C  Jacolyn Reedy, PA-C    Other Instructions

## 2020-08-22 DIAGNOSIS — H5203 Hypermetropia, bilateral: Secondary | ICD-10-CM | POA: Diagnosis not present

## 2020-08-22 DIAGNOSIS — H2513 Age-related nuclear cataract, bilateral: Secondary | ICD-10-CM | POA: Diagnosis not present

## 2020-08-23 DIAGNOSIS — D2372 Other benign neoplasm of skin of left lower limb, including hip: Secondary | ICD-10-CM | POA: Diagnosis not present

## 2020-08-23 DIAGNOSIS — L570 Actinic keratosis: Secondary | ICD-10-CM | POA: Diagnosis not present

## 2020-08-23 DIAGNOSIS — D1801 Hemangioma of skin and subcutaneous tissue: Secondary | ICD-10-CM | POA: Diagnosis not present

## 2020-08-23 DIAGNOSIS — Z85828 Personal history of other malignant neoplasm of skin: Secondary | ICD-10-CM | POA: Diagnosis not present

## 2020-08-23 DIAGNOSIS — L821 Other seborrheic keratosis: Secondary | ICD-10-CM | POA: Diagnosis not present

## 2020-09-06 ENCOUNTER — Telehealth: Payer: Self-pay | Admitting: Internal Medicine

## 2020-09-06 NOTE — Telephone Encounter (Signed)
LVM for pt to rtn my call to schedule AWV with [facility]. Please schedule this appt it pt calls the office.

## 2020-09-21 ENCOUNTER — Other Ambulatory Visit: Payer: Self-pay

## 2020-09-21 ENCOUNTER — Ambulatory Visit (INDEPENDENT_AMBULATORY_CARE_PROVIDER_SITE_OTHER): Payer: Medicare PPO

## 2020-09-21 VITALS — BP 102/80 | HR 68 | Temp 98.0°F | Ht 66.0 in | Wt 170.0 lb

## 2020-09-21 DIAGNOSIS — Z0000 Encounter for general adult medical examination without abnormal findings: Secondary | ICD-10-CM | POA: Diagnosis not present

## 2020-09-21 DIAGNOSIS — Z23 Encounter for immunization: Secondary | ICD-10-CM | POA: Diagnosis not present

## 2020-09-21 NOTE — Progress Notes (Signed)
Subjective:   [name] is a 80 y.o. female who presents for Medicare Annual (Subsequent) preventive examination.  Review of Systems     Cardiac Risk Factors include: advanced age (>2155men, 28>65 women);dyslipidemia;hypertension;family history of premature cardiovascular disease     Objective:    Today's Vitals   09/21/20 1610  BP: 102/80  Pulse: 68  Temp: 98 F (36.7 C)  SpO2: 96%  Weight: 170 lb (77.1 kg)  Height: 5\' 6"  (1.676 m)  PainSc: 0-No pain   Body mass index is 27.44 kg/m.  Advanced Directives 09/21/2020 03/29/2018 03/26/2018 10/01/2017 04/28/2016 04/24/2016 01/07/2016  Does Patient Have a Medical Advance Directive? Yes Yes Yes Yes Yes Yes No  Type of Advance Directive Living will;Healthcare Power of State Street Corporationttorney Healthcare Power of [place];Living will Healthcare Power of [place];Living will Living will Healthcare Power of [place];Living will Healthcare Power of [place];Living will -  Does patient want to make changes to medical advance directive? No - Patient declined No - Patient declined No - Patient declined - No - Patient declined No - Patient declined -  Copy of Healthcare Power of Attorney in Chart? No - copy requested No - copy requested No - copy requested - - No - copy requested -  Would patient like information on creating a medical advance directive? - - - No - Patient declined - - No - patient declined information    Current Medications (verified) Outpatient Encounter Medications as of 09/21/2020  Medication Sig   albuterol (PROAIR HFA) 108 (90 Base) MCG/ACT inhaler INHALE 1-2 PUFFS INTO THE LUNGS EVERY 4 (FOUR) HOURS AS NEEDED FOR WHEEZING OR SHORTNESS OF BREATH.   amLODipine (NORVASC) 5 MG tablet Take 1 tablet (5 mg total) by mouth daily.   cholecalciferol (VITAMIN D) 1000 units tablet Take 1,000 Units by mouth daily.    levothyroxine (SYNTHROID) 50 MCG tablet Take 1 tablet (50 mcg total) by mouth daily before breakfast.   metoprolol succinate (TOPROL XL) 25  MG 24 hr tablet Take 1 tablet (25 mg total) by mouth daily.   pravastatin (PRAVACHOL) 20 MG tablet Take 1 tablet (20 mg total) by mouth at bedtime.   No facility-administered encounter medications on file as of 09/21/2020.    Allergies (verified) Meperidine hcl, Nitrofurantoin, Griseofulvin, and Penicillins   History: Past Medical History:  Diagnosis Date   Chronic hepatitis C (HCC) 2014   DID HARVONI AND RIBIVORIN [place]    Headache    MIGRAINE AURA NO HEADACHE   History of blood transfusion 1990   AFTER GI BLEED   Hyperlipidemia    Hypertension    Multinodular goiter    SHRANK AWAY 25 YRS AGO   Osteoarthritis    OA   Palpitations    PACs & PVCs   Pneumonia YRS AGO   Past Surgical History:  Procedure Laterality Date   TOTAL KNEE ARTHROPLASTY Left 04/28/2016   Procedure: LEFT TOTAL KNEE ARTHROPLASTY;  Surgeon: Ollen GrossAluisio, Frank, MD;  Location: [facility];  Service: Orthopedics;  Laterality: Left;   TOTAL KNEE ARTHROPLASTY Right 03/29/2018   Procedure: RIGHT TOTAL KNEE ARTHROPLASTY;  Surgeon: Ollen GrossAluisio, Frank, MD;  Location: [facility];  Service: Orthopedics;  Laterality: Right;  50min   TUBAL LIGATION     Family History  Problem Relation Age of Onset   Heart disease Mother    Heart disease Father    COPD Father    Social History   Socioeconomic History   Marital status: Married    Spouse name: Not on  file   Number of children: Not on file   Years of education: Not on file   Highest education level: Not on file  Occupational History   Not on file  Tobacco Use   Smoking status: Never   Smokeless tobacco: Never  Vaping Use   Vaping Use: Never used  Substance and Sexual Activity   Alcohol use: Yes    Comment: 1 GLASS WINE PER DAY   Drug use: No   Sexual activity: Not on file  Other Topics Concern   Not on file  Social History Narrative   Not on file   Social Determinants of Health   Financial Resource Strain: Low Risk    Difficulty of Paying Living Expenses: Not hard at all   Food Insecurity: No Food Insecurity   Worried About Programme researcher, broadcasting/film/video in the Last Year: Never true   Ran Out of Food in the Last Year: Never true  Transportation Needs: No Transportation Needs   Lack of Transportation (Medical): No   Lack of Transportation (Non-Medical): No  Physical Activity: Sufficiently Active   Days of Exercise per Week: 7 days   Minutes of Exercise per Session: 60 min  Stress: No Stress Concern Present   Feeling of Stress : Not at all  Social Connections: Socially Integrated   Frequency of Communication with Friends and Family: More than three times a week   Frequency of Social Gatherings with Friends and Family: Three times a week   Attends Religious Services: More than 4 times per year   Active Member of Clubs or Organizations: Yes   Attends Engineer, structural: More than 4 times per year   Marital Status: Married    Tobacco Counseling Counseling given: Not Answered   Clinical Intake:  Pre-visit preparation completed: Yes  Pain : No/denies pain Pain Score: 0-No pain     BMI - recorded: 27.44 Nutritional Status: BMI 25 -29 Overweight Nutritional Risks: None Diabetes: No  How often do you need to have someone help you when you read instructions, pamphlets, or other written materials from your doctor or pharmacy?: 1 - Never What is the last grade level you completed in school?: Ph.D  Diabetic? no  Interpreter Needed?: No  Information entered by :: Susie Cassette, LPN.   Activities of Daily Living In your present state of health, do you have any difficulty performing the following activities: 09/21/2020 06/08/2020  Hearing? N N  Vision? N N  Difficulty concentrating or making decisions? N N  Walking or climbing stairs? N N  Dressing or bathing? N N  Doing errands, shopping? N N  Preparing Food and eating ? N -  Using the Toilet? N -  In the past six months, have you accidently leaked urine? Y -  Comment does not wear  protection -  Do you have problems with loss of bowel control? N -  Managing your Medications? N -  Managing your Finances? N -  Housekeeping or managing your Housekeeping? N -  Some recent data might be hidden    Patient Care Team: Myrlene Broker, MD as PCP - General (Internal Medicine) Corky Crafts, MD as PCP - Cardiology (Cardiology) Antony Contras, MD as Consulting Physician (Ophthalmology) Mindi Slicker, DMD as Consulting Physician (Dentistry)  Indicate any recent Medical Services you may have received from other than [facility] providers in the past year (date may be approximate).     Assessment:   This is a routine wellness examination  for [employer].  Hearing/Vision screen Hearing Screening - Comments:: Patient denied any hearing difficulty. Vision Screening - Comments:: Patient wears glasses.  Eye exam done once a year by Dr. Antony Contras.  Dietary issues and exercise activities discussed: Current Exercise Habits: Home exercise routine, Type of exercise: walking, Time (Minutes): 60, Frequency (Times/Week): 7, Weekly Exercise (Minutes/Week): 420, Intensity: Moderate, Exercise limited by: None identified   Goals Addressed               This Visit's Progress     Patient Stated (pt-stated)        My goal is to lose 10-15 pounds by continuing to walk 3 miles every day, watch my diet and staying hydrated.       Depression Screen PHQ 2/9 Scores 09/21/2020 09/21/2019 09/20/2018 09/17/2017 01/07/2016 01/07/2016 09/04/2015  PHQ - 2 Score 0 0 0 0 0 0 0  Exception Documentation - - - - Other- indicate reason in comment box Other- indicate reason in comment box -    Fall Risk Fall Risk  09/21/2020 09/21/2019 09/20/2018 09/17/2017 01/07/2016  Falls in the past year? 0 0 0 No No  Number falls in past yr: 0 - - - -  Injury with Fall? 0 - - - -  Risk for fall due to : No Fall Risks - - - Other (Comment)    FALL RISK PREVENTION PERTAINING TO THE HOME:  Any stairs in or  around the home? Yes  If so, are there any without handrails? No  Home free of loose throw rugs in walkways, pet beds, electrical cords, etc? Yes  Adequate lighting in your home to reduce risk of falls? Yes   ASSISTIVE DEVICES UTILIZED TO PREVENT FALLS:  Life alert? No  Use of a cane, walker or w/c? No  Grab bars in the bathroom? Yes  Shower chair or bench in shower? No  Elevated toilet seat or a handicapped toilet? No   TIMED UP AND GO:  Was the test performed? Yes .  Length of time to ambulate 10 feet: 6 sec.   Gait steady and fast without use of assistive device  Cognitive Function: Normal cognitive status assessed by direct observation by this Nurse Health Advisor. No abnormalities found.          Immunizations Immunization History  Administered Date(s) Administered   Fluad Quad(high Dose 65+) 12/31/2018, 01/21/2020   Influenza-Unspecified 01/20/2018   PFIZER(Purple Top)SARS-COV-2 Vaccination 04/06/2019, 04/26/2019, 12/20/2019, 06/21/2020   Pneumococcal Conjugate-13 03/24/2013   Pneumococcal Polysaccharide-23 03/25/2007   Tdap 03/24/2009   Zoster Recombinat (Shingrix) 07/23/2017, 09/23/2017   Zoster, Live 03/24/2010    TDAP status: Due, Education has been provided regarding the importance of this vaccine. Advised may receive this vaccine at local pharmacy or Health Dept. Aware to provide a copy of the vaccination record if obtained from local pharmacy or Health Dept. Verbalized acceptance and understanding.  Flu Vaccine status: Up to date  Pneumococcal vaccine status: Up to date  Covid-19 vaccine status: Completed vaccines  Qualifies for Shingles Vaccine? Yes   Zostavax completed Yes   Shingrix Completed?: Yes  Screening Tests Health Maintenance  Topic Date Due   DEXA SCAN  Never done   PNA vac Low Risk Adult (2 of 2 - PPSV23) 03/24/2014   TETANUS/TDAP  03/25/2019   COVID-19 Vaccine (5 - Booster for Pfizer series) 10/21/2020   INFLUENZA VACCINE   10/22/2020   Hepatitis C Screening  Completed   Zoster Vaccines- Shingrix  Completed   HPV  VACCINES  Aged Out    Health Maintenance  Health Maintenance Due  Topic Date Due   DEXA SCAN  Never done   PNA vac Low Risk Adult (2 of 2 - PPSV23) 03/24/2014   TETANUS/TDAP  03/25/2019    Colorectal cancer screening: Type of screening: Colonoscopy. Completed 02/28/2014. Repeat every 10 years  Mammogram status: Completed 12/03/2016. Repeat every year  Bone density status: no record  Lung Cancer Screening: (Low Dose CT Chest recommended if Age 48-80 years, 30 pack-year currently smoking OR have quit w/in 15years.) does not qualify.   Lung Cancer Screening Referral: no  Additional Screening:  Hepatitis C Screening: does qualify; Completed yes  Vision Screening: Recommended annual ophthalmology exams for early detection of glaucoma and other disorders of the eye. Is the patient up to date with their annual eye exam?  Yes  Who is the provider or what is the name of the office in which the patient attends annual eye exams? Antony Contras, MD. If pt is not established with a provider, would they like to be referred to a provider to establish care? No .   Dental Screening: Recommended annual dental exams for proper oral hygiene  Community Resource Referral / Chronic Care Management: CRR required this visit?  No   CCM required this visit?  No      Plan:     I have personally reviewed and noted the following in the patient's chart:   Medical and social history Use of alcohol, tobacco or illicit drugs  Current medications and supplements including opioid prescriptions.  Functional ability and status Nutritional status Physical activity Advanced directives List of other physicians Hospitalizations, surgeries, and ER visits in previous 12 months Vitals Screenings to include cognitive, depression, and falls Referrals and appointments  In addition, I have reviewed and discussed with  patient certain preventive protocols, quality metrics, and best practice recommendations. A written personalized care plan for preventive services as well as general preventive health recommendations were provided to patient.     Mickeal Needy, LPN   06/29/5460   Nurse Notes: n/a

## 2020-09-21 NOTE — Patient Instructions (Signed)
[name] , Thank you for taking time to come for your Medicare Wellness Visit. I appreciate your ongoing commitment to your health goals. Please review the following plan we discussed and let me know if I can assist you in the future.   Screening recommendations/referrals: Colonoscopy: last done 02/28/2014; due every 10 years (no repeat due to age) Mammogram: last done 12/03/2016 Bone Density: last done 05/07/2017 Recommended yearly ophthalmology/optometry visit for glaucoma screening and checkup Recommended yearly dental visit for hygiene and checkup  Vaccinations: Influenza vaccine: 01/21/2020 Pneumococcal vaccine: 12/20/2013, 09/21/2020 Tdap vaccine: 05/25/2009; due every 10 years (OVERDUE) Shingles vaccine: 07/23/2017, 09/23/2017   Covid-19: 04/06/2019, 04/26/2019, 12/20/2019, 06/21/2020  Advanced directives: Please bring a copy of your health care power of attorney and living will to the office at your convenience.  Conditions/risks identified: Yes.  Goals: My goal is to lose 10-15 pounds by continuing to walk 3 miles every day, watch my diet and staying hydrated.  Next appointment: Please schedule your next Medicare Wellness Visit with your Nurse Health Advisor in 1 year by calling [phone].   Preventive Care 78 Years and Older, Female Preventive care refers to lifestyle choices and visits with your health care provider that can promote health and wellness. What does preventive care include? A yearly physical exam. This is also called an annual well check. Dental exams once or twice a year. Routine eye exams. Ask your health care provider how often you should have your eyes checked. Personal lifestyle choices, including: Daily care of your teeth and gums. Regular physical activity. Eating a healthy diet. Avoiding tobacco and drug use. Limiting alcohol use. Practicing safe sex. Taking low-dose aspirin every day. Taking vitamin and mineral supplements as recommended by your health  care provider. What happens during an annual well check? The services and screenings done by your health care provider during your annual well check will depend on your age, overall health, lifestyle risk factors, and family history of disease. Counseling  Your health care provider may ask you questions about your: Alcohol use. Tobacco use. Drug use. Emotional well-being. Home and relationship well-being. Sexual activity. Eating habits. History of falls. Memory and ability to understand (cognition). Work and work Astronomer. Reproductive health. Screening  You may have the following tests or measurements: Height, weight, and BMI. Blood pressure. Lipid and cholesterol levels. These may be checked every 5 years, or more frequently if you are over 78 years old. Skin check. Lung cancer screening. You may have this screening every year starting at age 78 if you have a 30-pack-year history of smoking and currently smoke or have quit within the past 15 years. Fecal occult blood test (FOBT) of the stool. You may have this test every year starting at age 78. Flexible sigmoidoscopy or colonoscopy. You may have a sigmoidoscopy every 5 years or a colonoscopy every 10 years starting at age 78. Hepatitis C blood test. Hepatitis B blood test. Sexually transmitted disease (STD) testing. Diabetes screening. This is done by checking your blood sugar (glucose) after you have not eaten for a while (fasting). You may have this done every 1-3 years. Bone density scan. This is done to screen for osteoporosis. You may have this done starting at age 78. Mammogram. This may be done every 1-2 years. Talk to your health care provider about how often you should have regular mammograms. Talk with your health care provider about your test results, treatment options, and if necessary, the need for more tests. Vaccines  Your health care  provider may recommend certain vaccines, such as: Influenza vaccine. This is  recommended every year. Tetanus, diphtheria, and acellular pertussis (Tdap, Td) vaccine. You may need a Td booster every 10 years. Zoster vaccine. You may need this after age 78. Pneumococcal 13-valent conjugate (PCV13) vaccine. One dose is recommended after age 78. Pneumococcal polysaccharide (PPSV23) vaccine. One dose is recommended after age 78. Talk to your health care provider about which screenings and vaccines you need and how often you need them. This information is not intended to replace advice given to you by your health care provider. Make sure you discuss any questions you have with your health care provider. Document Released: 04/06/2015 Document Revised: 11/28/2015 Document Reviewed: 01/09/2015 Elsevier Interactive Patient Education  2017 [employer] Prevention in the Home Falls can cause injuries. They can happen to people of all ages. There are many things you can do to make your home safe and to help prevent falls. What can I do on the outside of my home? Regularly fix the edges of walkways and driveways and fix any cracks. Remove anything that might make you trip as you walk through a door, such as a raised step or threshold. Trim any bushes or trees on the path to your home. Use bright outdoor lighting. Clear any walking paths of anything that might make someone trip, such as rocks or tools. Regularly check to see if handrails are loose or broken. Make sure that both sides of any steps have handrails. Any raised decks and porches should have guardrails on the edges. Have any leaves, snow, or ice cleared regularly. Use sand or salt on walking paths during winter. Clean up any spills in your garage right away. This includes oil or grease spills. What can I do in the bathroom? Use night lights. Install grab bars by the toilet and in the tub and shower. Do not use towel bars as grab bars. Use non-skid mats or decals in the tub or shower. If you need to sit down in  the shower, use a plastic, non-slip stool. Keep the floor dry. Clean up any water that spills on the floor as soon as it happens. Remove soap buildup in the tub or shower regularly. Attach bath mats securely with double-sided non-slip rug tape. Do not have throw rugs and other things on the floor that can make you trip. What can I do in the bedroom? Use night lights. Make sure that you have a light by your bed that is easy to reach. Do not use any sheets or blankets that are too big for your bed. They should not hang down onto the floor. Have a firm chair that has side arms. You can use this for support while you get dressed. Do not have throw rugs and other things on the floor that can make you trip. What can I do in the kitchen? Clean up any spills right away. Avoid walking on wet floors. Keep items that you use a lot in easy-to-reach places. If you need to reach something above you, use a strong step stool that has a grab bar. Keep electrical cords out of the way. Do not use floor polish or wax that makes floors slippery. If you must use wax, use non-skid floor wax. Do not have throw rugs and other things on the floor that can make you trip. What can I do with my stairs? Do not leave any items on the stairs. Make sure that there are handrails on both  sides of the stairs and use them. Fix handrails that are broken or loose. Make sure that handrails are as long as the stairways. Check any carpeting to make sure that it is firmly attached to the stairs. Fix any carpet that is loose or worn. Avoid having throw rugs at the top or bottom of the stairs. If you do have throw rugs, attach them to the floor with carpet tape. Make sure that you have a light switch at the top of the stairs and the bottom of the stairs. If you do not have them, ask someone to add them for you. What else can I do to help prevent falls? Wear shoes that: Do not have high heels. Have rubber bottoms. Are comfortable  and fit you well. Are closed at the toe. Do not wear sandals. If you use a stepladder: Make sure that it is fully opened. Do not climb a closed stepladder. Make sure that both sides of the stepladder are locked into place. Ask someone to hold it for you, if possible. Clearly mark and make sure that you can see: Any grab bars or handrails. First and last steps. Where the edge of each step is. Use tools that help you move around (mobility aids) if they are needed. These include: Canes. Walkers. Scooters. Crutches. Turn on the lights when you go into a dark area. Replace any light bulbs as soon as they burn out. Set up your furniture so you have a clear path. Avoid moving your furniture around. If any of your floors are uneven, fix them. If there are any pets around you, be aware of where they are. Review your medicines with your doctor. Some medicines can make you feel dizzy. This can increase your chance of falling. Ask your doctor what other things that you can do to help prevent falls. This information is not intended to replace advice given to you by your health care provider. Make sure you discuss any questions you have with your health care provider. Document Released: 01/04/2009 Document Revised: 08/16/2015 Document Reviewed: 04/14/2014 Elsevier Interactive Patient Education  2017 [employer].

## 2020-10-15 ENCOUNTER — Encounter: Payer: Self-pay | Admitting: Internal Medicine

## 2020-10-16 MED ORDER — AMLODIPINE BESYLATE 2.5 MG PO TABS: 2.5000 mg | ORAL_TABLET | Freq: Every day | ORAL | 3 refills | Status: DC

## 2020-10-18 DIAGNOSIS — L578 Other skin changes due to chronic exposure to nonionizing radiation: Secondary | ICD-10-CM | POA: Diagnosis not present

## 2020-10-18 DIAGNOSIS — L720 Epidermal cyst: Secondary | ICD-10-CM | POA: Diagnosis not present

## 2020-11-06 ENCOUNTER — Other Ambulatory Visit: Payer: Self-pay | Admitting: Internal Medicine

## 2021-01-15 ENCOUNTER — Other Ambulatory Visit: Payer: Self-pay | Admitting: Internal Medicine

## 2021-01-16 ENCOUNTER — Encounter: Payer: Self-pay | Admitting: Internal Medicine

## 2021-01-21 ENCOUNTER — Ambulatory Visit: Payer: Medicare PPO | Admitting: Internal Medicine

## 2021-01-21 ENCOUNTER — Other Ambulatory Visit: Payer: Self-pay

## 2021-01-21 ENCOUNTER — Encounter: Payer: Self-pay | Admitting: Internal Medicine

## 2021-01-21 VITALS — BP 120/78 | HR 57 | Resp 18 | Ht 66.0 in | Wt 168.4 lb

## 2021-01-21 DIAGNOSIS — E782 Mixed hyperlipidemia: Secondary | ICD-10-CM

## 2021-01-21 DIAGNOSIS — E042 Nontoxic multinodular goiter: Secondary | ICD-10-CM

## 2021-01-21 DIAGNOSIS — I10 Essential (primary) hypertension: Secondary | ICD-10-CM | POA: Diagnosis not present

## 2021-01-21 DIAGNOSIS — Z0000 Encounter for general adult medical examination without abnormal findings: Secondary | ICD-10-CM | POA: Diagnosis not present

## 2021-01-21 LAB — COMPREHENSIVE METABOLIC PANEL
ALT: 11 U/L (ref 0–35)
AST: 16 U/L (ref 0–37)
Albumin: 4.3 g/dL (ref 3.5–5.2)
Alkaline Phosphatase: 77 U/L (ref 39–117)
BUN: 16 mg/dL (ref 6–23)
CO2: 26 mEq/L (ref 19–32)
Calcium: 9.3 mg/dL (ref 8.4–10.5)
Chloride: 106 mEq/L (ref 96–112)
Creatinine, Ser: 0.58 mg/dL (ref 0.40–1.20)
GFR: 86.89 mL/min (ref >60.00)
Glucose, Bld: 107 mg/dL — ABNORMAL HIGH (ref 70–99)
Potassium: 3.8 mEq/L (ref 3.5–5.1)
Sodium: 140 mEq/L (ref 135–145)
Total Bilirubin: 0.8 mg/dL (ref 0.2–1.2)
Total Protein: 6.5 g/dL (ref 6.0–8.3)

## 2021-01-21 LAB — CBC
HCT: 41.1 % (ref 36.0–46.0)
Hemoglobin: 13.3 g/dL (ref 12.0–15.0)
MCHC: 32.5 g/dL (ref 30.0–36.0)
MCV: 89.2 fl (ref 78.0–100.0)
Platelets: 157 10*3/uL (ref 150.0–400.0)
RBC: 4.6 Mil/uL (ref 3.87–5.11)
RDW: 14 % (ref 11.5–15.5)
WBC: 3.7 10*3/uL — ABNORMAL LOW (ref 4.0–10.5)

## 2021-01-21 LAB — LIPID PANEL
Cholesterol: 172 mg/dL (ref 0–200)
HDL: 52.2 mg/dL (ref >39.00)
LDL Cholesterol: 102 mg/dL — ABNORMAL HIGH (ref 0–99)
NonHDL: 119.55
Total CHOL/HDL Ratio: 3
Triglycerides: 86 mg/dL (ref 0.0–149.0)
VLDL: 17.2 mg/dL (ref 0.0–40.0)

## 2021-01-21 LAB — HEMOGLOBIN A1C: Hgb A1c MFr Bld: 5.5 % (ref 4.6–6.5)

## 2021-01-21 LAB — TSH: TSH: 2.18 u[IU]/mL (ref 0.35–5.50)

## 2021-01-21 MED ORDER — AMLODIPINE BESYLATE 5 MG PO TABS: 5.0000 mg | ORAL_TABLET | Freq: Every day | ORAL | 3 refills | Status: DC

## 2021-01-21 NOTE — Assessment & Plan Note (Signed)
New rx for amlodipine 5 mg daily and continue lower dose metoprolol 25 mg daily (changed by cardiology). Checking CMP and adjust as needed.

## 2021-01-21 NOTE — Progress Notes (Signed)
   Subjective:   Patient ID: [name], female    DOB: [birth year], 80 y.o.   MRN: [id number]  Medication Refill Pertinent negatives include no abdominal pain, chest pain, coughing, nausea or vomiting.  The patient is a 80 YO female coming in for physical.   PMH, FMH, social history reviewed and updated  Review of Systems  Constitutional: Negative.   HENT: Negative.    Eyes: Negative.   Respiratory:  Negative for cough, chest tightness and shortness of breath.   Cardiovascular:  Negative for chest pain, palpitations and leg swelling.  Gastrointestinal:  Negative for abdominal distention, abdominal pain, constipation, diarrhea, nausea and vomiting.  Musculoskeletal: Negative.   Skin: Negative.   Neurological: Negative.   Psychiatric/Behavioral: Negative.     Objective:  Physical Exam Constitutional:      Appearance: She is well-developed.  HENT:     Head: Normocephalic and atraumatic.  Cardiovascular:     Rate and Rhythm: Normal rate and regular rhythm.  Pulmonary:     Effort: Pulmonary effort is normal. No respiratory distress.     Breath sounds: Normal breath sounds. No wheezing or rales.  Abdominal:     General: Bowel sounds are normal. There is no distension.     Palpations: Abdomen is soft.     Tenderness: There is no abdominal tenderness. There is no rebound.  Musculoskeletal:     Cervical back: Normal range of motion.  Skin:    General: Skin is warm and dry.  Neurological:     Mental Status: She is alert and oriented to person, place, and time.     Coordination: Coordination normal.    Vitals:   01/21/21 1414  BP: 120/78  Pulse: (!) 57  Resp: 18  SpO2: 97%  Weight: 168 lb 6.4 oz (76.4 kg)  Height: 5\' 6"  (1.676 m)    This visit occurred during the SARS-CoV-2 public health emergency.  Safety protocols were in place, including screening questions prior to the visit, additional usage of staff PPE, and extensive cleaning of exam room while observing  appropriate contact time as indicated for disinfecting solutions.   Assessment & Plan:

## 2021-01-21 NOTE — Assessment & Plan Note (Signed)
Flu shot up to date. Covid-19 up to date. Pneumonia complete. Shingrix complete. Tetanus due advised to get at pharmacy. Colonoscopy aged out further. Mammogram yearly, pap smear aged out and dexa declines. Counseled about sun safety and mole surveillance. Counseled about the dangers of distracted driving. Given 10 year screening recommendations.

## 2021-01-21 NOTE — Assessment & Plan Note (Signed)
Checking TSH and adjust synthroid 50 mcg daily as needed. 

## 2021-01-21 NOTE — Patient Instructions (Signed)
Get the tetanus shot at the pharmacy.

## 2021-01-21 NOTE — Assessment & Plan Note (Signed)
Checking lipid panel and adjust pravastatin 20 mg daily as needed. 

## 2021-01-22 ENCOUNTER — Other Ambulatory Visit: Payer: Self-pay | Admitting: Internal Medicine

## 2021-01-22 MED ORDER — PRAVASTATIN SODIUM 20 MG PO TABS: 20.0000 mg | ORAL_TABLET | Freq: Every day | ORAL | 3 refills | Status: DC

## 2021-01-22 MED ORDER — LEVOTHYROXINE SODIUM 50 MCG PO TABS: 50.0000 ug | ORAL_TABLET | Freq: Every day | ORAL | 0 refills | Status: DC

## 2021-02-10 NOTE — Progress Notes (Signed)
Cardiology Office Note   Date:  02/11/2021   ID:  [name], [name] [birth year], MRN [id number]  PCP:  Myrlene Broker, MD    No chief complaint on file.  Abnormal ECG  Wt Readings from Last 3 Encounters:  02/11/21 165 lb (74.8 kg)  01/21/21 168 lb 6.4 oz (76.4 kg)  09/21/20 170 lb (77.1 kg)       History of Present Illness: [name] is a 80 y.o. female  who is being seen today for the evaluation of LBBB at the request of Myrlene Broker, *.   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 She also has multinodular goiter and chronic hepatitis C (s/p treatment - virus free at 6 months) as well as hyperlipidemia."   1990 GI bleed.  Had a transfusion.  She got Hep C from the transfusion.    The LBBB was new in 2022 compared to 2020.  She has had palpitations for years.  Mostly isolated beats for many years.     Father with MI in his 47s.  Mother with CHF. Brother with SVT.    She continues to have occasional premature beats.    Denies : Chest pain. Dizziness. Leg edema. Nitroglycerin use. Orthopnea. Paroxysmal nocturnal dyspnea. Shortness of breath. Syncope.    Walks 3 to 4 miles daily.  No cardiac symptoms.  Heart rate has gone up into the 90s with exertion when she checks.  At rest, it is always in the 50s.    Past Medical History:  Diagnosis Date   Chronic hepatitis C (HCC) 2014   DID HARVONI AND RIBIVORIN [place]    Headache    MIGRAINE AURA NO HEADACHE   History of blood transfusion 1990   AFTER GI BLEED   Hyperlipidemia    Hypertension    Multinodular goiter    SHRANK AWAY 25 YRS AGO   Osteoarthritis    OA   Palpitations    PACs & PVCs   Pneumonia YRS AGO    Past Surgical History:  Procedure Laterality Date   TOTAL KNEE ARTHROPLASTY Left 04/28/2016   Procedure: LEFT TOTAL KNEE ARTHROPLASTY;  Surgeon: Ollen Gross, MD;  Location: [facility];  Service: Orthopedics;   Laterality: Left;   TOTAL KNEE ARTHROPLASTY Right 03/29/2018   Procedure: RIGHT TOTAL KNEE ARTHROPLASTY;  Surgeon: Ollen Gross, MD;  Location: [facility];  Service: Orthopedics;  Laterality: Right;    TUBAL LIGATION       Current Outpatient Medications  Medication Sig Dispense Refill   albuterol (PROAIR HFA) 108 (90 Base) MCG/ACT inhaler INHALE 1-2 PUFFS INTO THE LUNGS EVERY 4 (FOUR) HOURS AS NEEDED FOR WHEEZING OR SHORTNESS OF BREATH. 8.5 g 6   amLODipine (NORVASC) 5 MG tablet Take 1 tablet (5 mg total) by mouth daily. 90 tablet 3   cholecalciferol (VITAMIN D) 1000 units tablet Take 1,000 Units by mouth daily.      levothyroxine (SYNTHROID) 50 MCG tablet Take 1 tablet (50 mcg total) by mouth daily before breakfast. 30 tablet 0   metoprolol succinate (TOPROL XL) 25 MG 24 hr tablet Take 1 tablet (25 mg total) by mouth daily. 90 tablet 3   pravastatin (PRAVACHOL) 20 MG tablet Take 1 tablet (20 mg total) by mouth daily. 30 tablet 3   No current facility-administered medications for this visit.    Allergies:   Meperidine hcl, Nitrofurantoin, Griseofulvin,  and Penicillins    Social History:  The patient  reports that she has never smoked. She has never used smokeless tobacco. She reports current alcohol use. She reports that she does not use drugs.   Family History:  The patient's family history includes COPD in her father; Heart disease in her father and mother.    ROS:  Please see the history of present illness.   Otherwise, review of systems are positive for .   All other systems are reviewed and negative.    PHYSICAL EXAM: VS:  BP 106/60   Pulse (!) 55   Ht 5\' 6"  (1.676 m)   Wt 165 lb (74.8 kg)   SpO2 98%   BMI 26.63 kg/m  , BMI Body mass index is 26.63 kg/m. GEN: Well nourished, well developed, in no acute distress HEENT: normal Neck: no JVD, carotid bruits, or masses Cardiac: RRR; no murmurs, rubs, or gallops,no edema  Respiratory:  clear to auscultation bilaterally,  normal work of breathing GI: soft, nontender, nondistended, + BS MS: no deformity or atrophy Skin: warm and dry, no rash Neuro:  Strength and sensation are intact Psych: euthymic mood, full affect   EKG:   The ekg ordered today demonstrates sinus bradycardia, LBBB   Recent Labs: 01/21/2021: ALT 11; BUN 16; Creatinine, Ser 0.58; Hemoglobin 13.3; Platelets 157.0; Potassium 3.8; Sodium 140; TSH 2.18   Lipid Panel    Component Value Date/Time   CHOL 172 01/21/2021 1440   TRIG 86.0 01/21/2021 1440   HDL 52.20 01/21/2021 1440   CHOLHDL 3 01/21/2021 1440   VLDL 17.2 01/21/2021 1440   LDLCALC 102 (H) 01/21/2021 1440   LDLDIRECT 153.4 04/07/2007 0744     Other studies Reviewed: Additional studies/ records that were reviewed today with results demonstrating: labs reviewed.   ASSESSMENT AND PLAN:  Abnormal ECG/LBBB: Sinus bradycardia.  Had 1 episode of dizziness sometime ago.  Nothing recently.  PR interval is normal.   Left bundle branch block persists.  We will stop metoprolol.  If she has more PACs and PVCs, could consider adding back a low-dose of the beta-blocker.  No indication for pacemaker at this time. HTN: The current medical regimen is effective;  continue present plan and medications. PACs, PVCs: Rarely feels palpitations.   Current medicines are reviewed at length with the patient today.  The patient concerns regarding her medicines were addressed.  The following changes have been made:  No change  Labs/ tests ordered today include:  No orders of the defined types were placed in this encounter.   Recommend 150 minutes/week of aerobic exercise Low fat, low carb, high fiber diet recommended  Disposition:   FU in 1 year   Signed, 04/09/2007, MD  02/11/2021 3:37 PM    [facility] [address] [place], [place], [place]  [place] Phone: [phone]; Fax: [phone]

## 2021-02-11 ENCOUNTER — Other Ambulatory Visit: Payer: Self-pay

## 2021-02-11 ENCOUNTER — Encounter: Payer: Self-pay | Admitting: Interventional Cardiology

## 2021-02-11 ENCOUNTER — Ambulatory Visit: Payer: Medicare PPO | Admitting: Interventional Cardiology

## 2021-02-11 VITALS — BP 106/60 | HR 55 | Ht 66.0 in | Wt 165.0 lb

## 2021-02-11 DIAGNOSIS — I493 Ventricular premature depolarization: Secondary | ICD-10-CM

## 2021-02-11 DIAGNOSIS — I10 Essential (primary) hypertension: Secondary | ICD-10-CM

## 2021-02-11 DIAGNOSIS — I491 Atrial premature depolarization: Secondary | ICD-10-CM

## 2021-02-11 DIAGNOSIS — I447 Left bundle-branch block, unspecified: Secondary | ICD-10-CM | POA: Diagnosis not present

## 2021-02-11 NOTE — Patient Instructions (Signed)
Medication Instructions:  Your physician has recommended you make the following change in your medication: Stop Toprol  *If you need a refill on your cardiac medications before your next appointment, please call your pharmacy*   Lab Work: none If you have labs (blood work) drawn today and your tests are completely normal, you will receive your results only by: MyChart Message (if you have MyChart) OR A paper copy in the mail If you have any lab test that is abnormal or we need to change your treatment, we will call you to review the results.   Testing/Procedures: none   Follow-Up: At [facility], you and your health needs are our priority.  As part of our continuing mission to provide you with exceptional heart care, we have created designated Provider Care Teams.  These Care Teams include your primary Cardiologist (physician) and Advanced Practice Providers (APPs -  Physician Assistants and Nurse Practitioners) who all work together to provide you with the care you need, when you need it.  We recommend signing up for the patient portal called "MyChart".  Sign up information is provided on this After Visit Summary.  MyChart is used to connect with patients for Virtual Visits (Telemedicine).  Patients are able to view lab/test results, encounter notes, upcoming appointments, etc.  Non-urgent messages can be sent to your provider as well.   To learn more about what you can do with MyChart, go to [email or web address].    Your next appointment:   12 month(s)  The format for your next appointment:   In Person  Provider:   Lance Muss, MD     Other Instructions

## 2021-03-11 ENCOUNTER — Other Ambulatory Visit: Payer: Self-pay | Admitting: Internal Medicine

## 2021-03-11 ENCOUNTER — Encounter: Payer: Self-pay | Admitting: Internal Medicine

## 2021-03-19 ENCOUNTER — Encounter: Payer: Self-pay | Admitting: Internal Medicine

## 2021-08-21 ENCOUNTER — Other Ambulatory Visit: Payer: Self-pay | Admitting: Internal Medicine

## 2021-08-22 DIAGNOSIS — L738 Other specified follicular disorders: Secondary | ICD-10-CM | POA: Diagnosis not present

## 2021-08-22 DIAGNOSIS — L570 Actinic keratosis: Secondary | ICD-10-CM | POA: Diagnosis not present

## 2021-08-22 DIAGNOSIS — D2261 Melanocytic nevi of right upper limb, including shoulder: Secondary | ICD-10-CM | POA: Diagnosis not present

## 2021-08-22 DIAGNOSIS — D2372 Other benign neoplasm of skin of left lower limb, including hip: Secondary | ICD-10-CM | POA: Diagnosis not present

## 2021-08-22 DIAGNOSIS — D1801 Hemangioma of skin and subcutaneous tissue: Secondary | ICD-10-CM | POA: Diagnosis not present

## 2021-08-22 DIAGNOSIS — L821 Other seborrheic keratosis: Secondary | ICD-10-CM | POA: Diagnosis not present

## 2021-08-22 DIAGNOSIS — Z85828 Personal history of other malignant neoplasm of skin: Secondary | ICD-10-CM | POA: Diagnosis not present

## 2021-08-23 DIAGNOSIS — H524 Presbyopia: Secondary | ICD-10-CM | POA: Diagnosis not present

## 2021-08-23 DIAGNOSIS — H2513 Age-related nuclear cataract, bilateral: Secondary | ICD-10-CM | POA: Diagnosis not present

## 2021-08-23 DIAGNOSIS — H5203 Hypermetropia, bilateral: Secondary | ICD-10-CM | POA: Diagnosis not present

## 2021-10-03 ENCOUNTER — Ambulatory Visit: Payer: Medicare PPO

## 2021-10-04 ENCOUNTER — Ambulatory Visit (INDEPENDENT_AMBULATORY_CARE_PROVIDER_SITE_OTHER): Payer: Medicare PPO

## 2021-10-04 DIAGNOSIS — Z0000 Encounter for general adult medical examination without abnormal findings: Secondary | ICD-10-CM | POA: Diagnosis not present

## 2021-10-04 NOTE — Progress Notes (Signed)
I connected with [name] today by telephone and verified that I am speaking with the correct person using two identifiers. Location patient: home Location provider: work Persons participating in the virtual visit: patient, provider.   I discussed the limitations, risks, security and privacy concerns of performing an evaluation and management service by telephone and the availability of in person appointments. I also discussed with the patient that there may be a patient responsible charge related to this service. The patient expressed understanding and verbally consented to this telephonic visit.    Interactive audio and video telecommunications were attempted between this provider and patient, however failed, due to patient having technical difficulties OR patient did not have access to video capability.  We continued and completed visit with audio only.  Some vital signs may be absent or patient reported.   Time Spent with patient on telephone encounter: 30 minutes  Subjective:   [name] is a 81 y.o. female who presents for Medicare Annual (Subsequent) preventive examination.  Review of Systems     Cardiac Risk Factors include: advanced age (>71men, >46 women);dyslipidemia;family history of premature cardiovascular disease;hypertension     Objective:    There were no vitals filed for this visit. There is no height or weight on file to calculate BMI.     10/04/2021    2:04 PM 09/21/2020    4:21 PM 03/29/2018   11:30 AM 03/26/2018    1:57 PM 10/01/2017    3:34 PM 04/28/2016    9:48 AM 04/24/2016    2:20 PM  Advanced Directives  Does Patient Have a Medical Advance Directive? Yes Yes Yes Yes Yes Yes Yes  Type of Advance Directive Living will;Healthcare Power of Attorney Living will;Healthcare Power of [employer] Power of [place];Living will Healthcare Power of [place];Living will Living will Healthcare Power of [place];Living will Healthcare Power of [place];Living  will  Does patient want to make changes to medical advance directive? No - Patient declined No - Patient declined No - Patient declined No - Patient declined  No - Patient declined No - Patient declined  Copy of Healthcare Power of Attorney in Chart? No - copy requested No - copy requested No - copy requested No - copy requested   No - copy requested  Would patient like information on creating a medical advance directive?     No - Patient declined      Current Medications (verified) Outpatient Encounter Medications as of 10/04/2021  Medication Sig   albuterol (PROAIR HFA) 108 (90 Base) MCG/ACT inhaler INHALE 1-2 PUFFS INTO THE LUNGS EVERY 4 (FOUR) HOURS AS NEEDED FOR WHEEZING OR SHORTNESS OF BREATH.   amLODipine (NORVASC) 5 MG tablet Take 1 tablet (5 mg total) by mouth daily.   cholecalciferol (VITAMIN D) 1000 units tablet Take 1,000 Units by mouth daily.    levothyroxine (SYNTHROID) 50 MCG tablet TAKE 1 TABLET EVERY DAY BEFORE BREAKFAST   pravastatin (PRAVACHOL) 20 MG tablet TAKE 1 TABLET EVERY DAY   No facility-administered encounter medications on file as of 10/04/2021.    Allergies (verified) Meperidine hcl, Nitrofurantoin, Griseofulvin, and Penicillins   History: Past Medical History:  Diagnosis Date   Chronic hepatitis C (HCC) 2014   DID HARVONI AND RIBIVORIN [place]    Headache    MIGRAINE AURA NO HEADACHE   History of blood transfusion 1990   AFTER GI BLEED   Hyperlipidemia    Hypertension    Multinodular goiter    SHRANK AWAY 25 YRS AGO  Osteoarthritis    OA   Palpitations    PACs & PVCs   Pneumonia YRS AGO   Past Surgical History:  Procedure Laterality Date   TOTAL KNEE ARTHROPLASTY Left 04/28/2016   Procedure: LEFT TOTAL KNEE ARTHROPLASTY;  Surgeon: Ollen Gross, MD;  Location: [facility];  Service: Orthopedics;  Laterality: Left;   TOTAL KNEE ARTHROPLASTY Right 03/29/2018   Procedure: RIGHT TOTAL KNEE ARTHROPLASTY;  Surgeon: Ollen Gross, MD;  Location: [facility];   Service: Orthopedics;  Laterality: Right;    TUBAL LIGATION     Family History  Problem Relation Age of Onset   Heart disease Mother    Heart disease Father    COPD Father    Social History   Socioeconomic History   Marital status: Married    Spouse name: Not on file   Number of children: Not on file   Years of education: Not on file   Highest education level: Not on file  Occupational History   Not on file  Tobacco Use   Smoking status: Never   Smokeless tobacco: Never  Vaping Use   Vaping Use: Never used  Substance and Sexual Activity   Alcohol use: Yes    Comment: 1 GLASS WINE PER DAY   Drug use: No   Sexual activity: Not on file  Other Topics Concern   Not on file  Social History Narrative   Not on file   Social Determinants of Health   Financial Resource Strain: Low Risk  (09/21/2020)   Overall Financial Resource Strain (CARDIA)    Difficulty of Paying Living Expenses: Not hard at all  Food Insecurity: No Food Insecurity (09/21/2020)   Hunger Vital Sign    Worried About Running Out of Food in the Last Year: Never true    Ran Out of Food in the Last Year: Never true  Transportation Needs: No Transportation Needs (09/21/2020)   PRAPARE - Administrator, Civil Service (Medical): No    Lack of Transportation (Non-Medical): No  Physical Activity: Sufficiently Active (09/21/2020)   Exercise Vital Sign    Days of Exercise per Week: 7 days    Minutes of Exercise per Session: 60 min  Stress: No Stress Concern Present (09/21/2020)   [employer] of Occupational Health - Occupational Stress Questionnaire    Feeling of Stress : Not at all  Social Connections: Socially Integrated (09/21/2020)   Social Connection and Isolation Panel [NHANES]    Frequency of Communication with Friends and Family: More than three times a week    Frequency of Social Gatherings with Friends and Family: Three times a week    Attends Religious Services: More than 4 times per year     Active Member of Clubs or Organizations: Yes    Attends Engineer, structural: More than 4 times per year    Marital Status: Married    Tobacco Counseling Counseling given: Not Answered   Clinical Intake:  Pre-visit preparation completed: Yes  Pain : No/denies pain     BMI - recorded: 26.64 Nutritional Status: BMI 25 -29 Overweight Nutritional Risks: None  How often do you need to have someone help you when you read instructions, pamphlets, or other written materials from your doctor or pharmacy?: 1 - Never What is the last grade level you completed in school?: Doctorate Degree  Diabetic? no  Interpreter Needed?: No  Information entered by :: Susie Cassette, LPN.   Activities of Daily Living    10/04/2021  2:12 PM  In your present state of health, do you have any difficulty performing the following activities:  Hearing? 0  Vision? 0  Difficulty concentrating or making decisions? 0  Walking or climbing stairs? 0  Dressing or bathing? 0  Doing errands, shopping? 0  Preparing Food and eating ? N  Using the Toilet? N  In the past six months, have you accidently leaked urine? N  Do you have problems with loss of bowel control? N  Managing your Medications? N  Managing your Finances? N  Housekeeping or managing your Housekeeping? N    Patient Care Team: Myrlene Broker, MD as PCP - General (Internal Medicine) Corky Crafts, MD as PCP - Cardiology (Cardiology) Antony Contras, MD as Consulting Physician (Ophthalmology) Mindi Slicker, DMD as Consulting Physician (Dentistry)  Indicate any recent Medical Services you may have received from other than [facility] providers in the past year (date may be approximate).     Assessment:   This is a routine wellness examination for [name].  Hearing/Vision screen No results found.  Dietary issues and exercise activities discussed: Current Exercise Habits: Home exercise routine, Type of exercise:  walking (4 miles everyday), Time (Minutes): 60, Frequency (Times/Week): 7, Weekly Exercise (Minutes/Week): 420, Intensity: Moderate, Exercise limited by: None identified   Goals Addressed   None   Depression Screen    10/04/2021    2:08 PM 09/21/2020    4:15 PM 09/21/2019   10:24 AM 09/20/2018    2:41 PM 09/17/2017    3:37 PM 01/07/2016    3:09 PM 01/07/2016    3:00 PM  PHQ 2/9 Scores  PHQ - 2 Score 0 0 0 0 0 0 0  Exception Documentation      Other- indicate reason in comment box Other- indicate reason in comment box    Fall Risk    10/04/2021    2:04 PM 09/21/2020    4:22 PM 09/21/2019   10:24 AM 09/20/2018    2:41 PM 09/17/2017    3:37 PM  Fall Risk   Falls in the past year? 0 0 0 0 No  Number falls in past yr: 0 0     Injury with Fall? 0 0     Risk for fall due to : No Fall Risks No Fall Risks     Follow up Falls evaluation completed        FALL RISK PREVENTION PERTAINING TO THE HOME:  Any stairs in or around the home? Yes  If so, are there any without handrails? No  Home free of loose throw rugs in walkways, pet beds, electrical cords, etc? Yes  Adequate lighting in your home to reduce risk of falls? Yes   ASSISTIVE DEVICES UTILIZED TO PREVENT FALLS:  Life alert? No  Use of a cane, walker or w/c? No  Grab bars in the bathroom? Yes  Shower chair or bench in shower? Yes  Elevated toilet seat or a handicapped toilet? No   TIMED UP AND GO:  Was the test performed? No .  Length of time to ambulate 10 feet: n/a sec.   Appearance of gait: Gait not evaluated during this visit.  Cognitive Function:        10/04/2021    2:13 PM  6CIT Screen  What Year? 0 points  What month? 0 points  What time? 0 points  Count back from 20 0 points  Months in reverse 0 points  Repeat phrase 0 points  Total Score 0  points    Immunizations Immunization History  Administered Date(s) Administered   Fluad Quad(high Dose 65+) 12/31/2018, 01/21/2020, 12/28/2020    Influenza-Unspecified 01/20/2018   PFIZER(Purple Top)SARS-COV-2 Vaccination 04/06/2019, 04/26/2019, 12/20/2019, 06/21/2020, 12/04/2020   Pfizer Covid-19 Vaccine Bivalent Booster 48yrs & up 08/27/2021   Pneumococcal Conjugate-13 03/24/2013   Pneumococcal Polysaccharide-23 03/25/2007, 09/21/2020   Tdap 03/24/2009   Zoster Recombinat (Shingrix) 07/23/2017, 09/23/2017   Zoster, Live 03/24/2010    TDAP status: Due, Education has been provided regarding the importance of this vaccine. Advised may receive this vaccine at local pharmacy or Health Dept. Aware to provide a copy of the vaccination record if obtained from local pharmacy or Health Dept. Verbalized acceptance and understanding.  Flu Vaccine status: Up to date  Pneumococcal vaccine status: Up to date  Covid-19 vaccine status: Completed vaccines  Qualifies for Shingles Vaccine? Yes   Zostavax completed Yes   Shingrix Completed?: Yes  Screening Tests Health Maintenance  Topic Date Due   DEXA SCAN  Never done   TETANUS/TDAP  03/25/2019   INFLUENZA VACCINE  10/22/2021   Pneumonia Vaccine 51+ Years old  Completed   COVID-19 Vaccine  Completed   Hepatitis C Screening  Completed   Zoster Vaccines- Shingrix  Completed   HPV VACCINES  Aged Out    Health Maintenance  Health Maintenance Due  Topic Date Due   DEXA SCAN  Never done   TETANUS/TDAP  03/25/2019    Colorectal cancer screening: No longer required.   Mammogram status: No longer required due to patient.  Bone Density status: no record  Lung Cancer Screening: (Low Dose CT Chest recommended if Age 44-80 years, 30 pack-year currently smoking OR have quit w/in 15years.) does not qualify.   Lung Cancer Screening Referral: no  Additional Screening:  Hepatitis C Screening: does qualify; Completed 09/20/2018  Vision Screening: Recommended annual ophthalmology exams for early detection of glaucoma and other disorders of the eye. Is the patient up to date with their  annual eye exam?  Yes  Who is the provider or what is the name of the office in which the patient attends annual eye exams? Antony Contras, MD. If pt is not established with a provider, would they like to be referred to a provider to establish care? No .   Dental Screening: Recommended annual dental exams for proper oral hygiene  Community Resource Referral / Chronic Care Management: CRR required this visit?  No   CCM required this visit?  No      Plan:     I have personally reviewed and noted the following in the patient's chart:   Medical and social history Use of alcohol, tobacco or illicit drugs  Current medications and supplements including opioid prescriptions.  Functional ability and status Nutritional status Physical activity Advanced directives List of other physicians Hospitalizations, surgeries, and ER visits in previous 12 months Vitals Screenings to include cognitive, depression, and falls Referrals and appointments  In addition, I have reviewed and discussed with patient certain preventive protocols, quality metrics, and best practice recommendations. A written personalized care plan for preventive services as well as general preventive health recommendations were provided to patient.     Mickeal Needy, LPN   [id number]/[id number]   Nurse Notes:  There were no vitals filed for this visit. There is no height or weight on file to calculate BMI. Patient stated that she has no issues with gait or balance; does not use any assistive devices. Medications reviewed with patient; no opioid use  noted.

## 2021-10-04 NOTE — Patient Instructions (Signed)
[name] , Thank you for taking time to come for your Medicare Wellness Visit. I appreciate your ongoing commitment to your health goals. Please review the following plan we discussed and let me know if I can assist you in the future.   Screening recommendations/referrals: Colonoscopy: Discontinued Mammogram: Discontinued Bone Density: Discontinued Recommended yearly ophthalmology/optometry visit for glaucoma screening and checkup Recommended yearly dental visit for hygiene and checkup  Vaccinations: Influenza vaccine: 12/28/2020 Pneumococcal vaccine: 12/20/2013, 09/21/2020 Tdap vaccine: 05/25/2009; due every 10 years (overdue; can check with local pharmacy) Shingles vaccine: 07/23/2017, 09/23/2017  Covid-19: 04/06/2019, 04/26/2019, 12/20/2019, 06/21/2020, 12/04/2020, 08/27/2021  Advanced directives: Yes; Please bring a copy of your health care power of attorney and living will to the office at your convenience.  Conditions/risks identified: Yes; Client understands the importance of follow-up appointments with providers by attending scheduled visits and discussed goals to eat healthier, increase physical activity 5 times a week for 30 minutes each, exercise the brain by doing stimulating brain exercises (reading, adult coloring, crafting, listening to music, puzzles, etc.), socialize and enjoy life more, get enough sleep at least 8-9 hours average per night and make time for laughter.  Next appointment: Please schedule your next Medicare Wellness Visit with your Nurse Health Advisor in 1 year by calling [phone].   Preventive Care 7 Years and Older, Female Preventive care refers to lifestyle choices and visits with your health care provider that can promote health and wellness. What does preventive care include? A yearly physical exam. This is also called an annual well check. Dental exams once or twice a year. Routine eye exams. Ask your health care provider how often you should have your eyes  checked. Personal lifestyle choices, including: Daily care of your teeth and gums. Regular physical activity. Eating a healthy diet. Avoiding tobacco and drug use. Limiting alcohol use. Practicing safe sex. Taking low-dose aspirin every day. Taking vitamin and mineral supplements as recommended by your health care provider. What happens during an annual well check? The services and screenings done by your health care provider during your annual well check will depend on your age, overall health, lifestyle risk factors, and family history of disease. Counseling  Your health care provider may ask you questions about your: Alcohol use. Tobacco use. Drug use. Emotional well-being. Home and relationship well-being. Sexual activity. Eating habits. History of falls. Memory and ability to understand (cognition). Work and work Astronomer. Reproductive health. Screening  You may have the following tests or measurements: Height, weight, and BMI. Blood pressure. Lipid and cholesterol levels. These may be checked every 5 years, or more frequently if you are over 74 years old. Skin check. Lung cancer screening. You may have this screening every year starting at age 40 if you have a 30-pack-year history of smoking and currently smoke or have quit within the past 15 years. Fecal occult blood test (FOBT) of the stool. You may have this test every year starting at age 45. Flexible sigmoidoscopy or colonoscopy. You may have a sigmoidoscopy every 5 years or a colonoscopy every 10 years starting at age 60. Hepatitis C blood test. Hepatitis B blood test. Sexually transmitted disease (STD) testing. Diabetes screening. This is done by checking your blood sugar (glucose) after you have not eaten for a while (fasting). You may have this done every 1-3 years. Bone density scan. This is done to screen for osteoporosis. You may have this done starting at age 51. Mammogram. This may be done every 1-2  years. Talk to  your health care provider about how often you should have regular mammograms. Talk with your health care provider about your test results, treatment options, and if necessary, the need for more tests. Vaccines  Your health care provider may recommend certain vaccines, such as: Influenza vaccine. This is recommended every year. Tetanus, diphtheria, and acellular pertussis (Tdap, Td) vaccine. You may need a Td booster every 10 years. Zoster vaccine. You may need this after age 75. Pneumococcal 13-valent conjugate (PCV13) vaccine. One dose is recommended after age 5. Pneumococcal polysaccharide (PPSV23) vaccine. One dose is recommended after age 37. Talk to your health care provider about which screenings and vaccines you need and how often you need them. This information is not intended to replace advice given to you by your health care provider. Make sure you discuss any questions you have with your health care provider. Document Released: 04/06/2015 Document Revised: 11/28/2015 Document Reviewed: 01/09/2015 Elsevier Interactive Patient Education  2017 [employer].  Fall Prevention in the Home Falls can cause injuries. They can happen to people of all ages. There are many things you can do to make your home safe and to help prevent falls. What can I do on the outside of my home? Regularly fix the edges of walkways and driveways and fix any cracks. Remove anything that might make you trip as you walk through a door, such as a raised step or threshold. Trim any bushes or trees on the path to your home. Use bright outdoor lighting. Clear any walking paths of anything that might make someone trip, such as rocks or tools. Regularly check to see if handrails are loose or broken. Make sure that both sides of any steps have handrails. Any raised decks and porches should have guardrails on the edges. Have any leaves, snow, or ice cleared regularly. Use sand or salt on walking paths  during winter. Clean up any spills in your garage right away. This includes oil or grease spills. What can I do in the bathroom? Use night lights. Install grab bars by the toilet and in the tub and shower. Do not use towel bars as grab bars. Use non-skid mats or decals in the tub or shower. If you need to sit down in the shower, use a plastic, non-slip stool. Keep the floor dry. Clean up any water that spills on the floor as soon as it happens. Remove soap buildup in the tub or shower regularly. Attach bath mats securely with double-sided non-slip rug tape. Do not have throw rugs and other things on the floor that can make you trip. What can I do in the bedroom? Use night lights. Make sure that you have a light by your bed that is easy to reach. Do not use any sheets or blankets that are too big for your bed. They should not hang down onto the floor. Have a firm chair that has side arms. You can use this for support while you get dressed. Do not have throw rugs and other things on the floor that can make you trip. What can I do in the kitchen? Clean up any spills right away. Avoid walking on wet floors. Keep items that you use a lot in easy-to-reach places. If you need to reach something above you, use a strong step stool that has a grab bar. Keep electrical cords out of the way. Do not use floor polish or wax that makes floors slippery. If you must use wax, use non-skid floor wax. Do not have  throw rugs and other things on the floor that can make you trip. What can I do with my stairs? Do not leave any items on the stairs. Make sure that there are handrails on both sides of the stairs and use them. Fix handrails that are broken or loose. Make sure that handrails are as long as the stairways. Check any carpeting to make sure that it is firmly attached to the stairs. Fix any carpet that is loose or worn. Avoid having throw rugs at the top or bottom of the stairs. If you do have throw  rugs, attach them to the floor with carpet tape. Make sure that you have a light switch at the top of the stairs and the bottom of the stairs. If you do not have them, ask someone to add them for you. What else can I do to help prevent falls? Wear shoes that: Do not have high heels. Have rubber bottoms. Are comfortable and fit you well. Are closed at the toe. Do not wear sandals. If you use a stepladder: Make sure that it is fully opened. Do not climb a closed stepladder. Make sure that both sides of the stepladder are locked into place. Ask someone to hold it for you, if possible. Clearly mark and make sure that you can see: Any grab bars or handrails. First and last steps. Where the edge of each step is. Use tools that help you move around (mobility aids) if they are needed. These include: Canes. Walkers. Scooters. Crutches. Turn on the lights when you go into a dark area. Replace any light bulbs as soon as they burn out. Set up your furniture so you have a clear path. Avoid moving your furniture around. If any of your floors are uneven, fix them. If there are any pets around you, be aware of where they are. Review your medicines with your doctor. Some medicines can make you feel dizzy. This can increase your chance of falling. Ask your doctor what other things that you can do to help prevent falls. This information is not intended to replace advice given to you by your health care provider. Make sure you discuss any questions you have with your health care provider. Document Released: 01/04/2009 Document Revised: 08/16/2015 Document Reviewed: 04/14/2014 Elsevier Interactive Patient Education  2017 [employer].

## 2021-10-09 ENCOUNTER — Other Ambulatory Visit: Payer: Self-pay | Admitting: Internal Medicine

## 2021-10-15 ENCOUNTER — Encounter: Payer: Self-pay | Admitting: Internal Medicine

## 2021-10-15 DIAGNOSIS — R194 Change in bowel habit: Secondary | ICD-10-CM

## 2021-10-15 DIAGNOSIS — R634 Abnormal weight loss: Secondary | ICD-10-CM

## 2021-11-01 DIAGNOSIS — R194 Change in bowel habit: Secondary | ICD-10-CM | POA: Diagnosis not present

## 2021-11-01 DIAGNOSIS — R1032 Left lower quadrant pain: Secondary | ICD-10-CM | POA: Diagnosis not present

## 2021-11-01 DIAGNOSIS — R634 Abnormal weight loss: Secondary | ICD-10-CM | POA: Diagnosis not present

## 2021-11-06 ENCOUNTER — Encounter: Payer: Self-pay | Admitting: Internal Medicine

## 2021-11-06 ENCOUNTER — Ambulatory Visit: Payer: Medicare PPO | Admitting: Internal Medicine

## 2021-11-06 VITALS — BP 104/68 | HR 77 | Temp 97.7°F | Ht 66.0 in | Wt 155.0 lb

## 2021-11-06 DIAGNOSIS — R002 Palpitations: Secondary | ICD-10-CM | POA: Diagnosis not present

## 2021-11-06 DIAGNOSIS — E782 Mixed hyperlipidemia: Secondary | ICD-10-CM | POA: Diagnosis not present

## 2021-11-06 DIAGNOSIS — E042 Nontoxic multinodular goiter: Secondary | ICD-10-CM

## 2021-11-06 DIAGNOSIS — I10 Essential (primary) hypertension: Secondary | ICD-10-CM | POA: Diagnosis not present

## 2021-11-06 DIAGNOSIS — N390 Urinary tract infection, site not specified: Secondary | ICD-10-CM

## 2021-11-06 DIAGNOSIS — Z0000 Encounter for general adult medical examination without abnormal findings: Secondary | ICD-10-CM | POA: Diagnosis not present

## 2021-11-06 LAB — COMPREHENSIVE METABOLIC PANEL
ALT: 13 U/L (ref 0–35)
AST: 18 U/L (ref 0–37)
Albumin: 4.3 g/dL (ref 3.5–5.2)
Alkaline Phosphatase: 76 U/L (ref 39–117)
BUN: 17 mg/dL (ref 6–23)
CO2: 27 mEq/L (ref 19–32)
Calcium: 9.3 mg/dL (ref 8.4–10.5)
Chloride: 105 mEq/L (ref 96–112)
Creatinine, Ser: 0.68 mg/dL (ref 0.40–1.20)
GFR: 83.16 mL/min (ref >60.00)
Glucose, Bld: 91 mg/dL (ref 70–99)
Potassium: 4.2 mEq/L (ref 3.5–5.1)
Sodium: 141 mEq/L (ref 135–145)
Total Bilirubin: 1.1 mg/dL (ref 0.2–1.2)
Total Protein: 6.6 g/dL (ref 6.0–8.3)

## 2021-11-06 LAB — LIPID PANEL
Cholesterol: 170 mg/dL (ref 0–200)
HDL: 56.5 mg/dL (ref >39.00)
LDL Cholesterol: 98 mg/dL (ref 0–99)
NonHDL: 113.52
Total CHOL/HDL Ratio: 3
Triglycerides: 78 mg/dL (ref 0.0–149.0)
VLDL: 15.6 mg/dL (ref 0.0–40.0)

## 2021-11-06 LAB — CBC
HCT: 39.4 % (ref 36.0–46.0)
Hemoglobin: 13.1 g/dL (ref 12.0–15.0)
MCHC: 33.3 g/dL (ref 30.0–36.0)
MCV: 89.6 fl (ref 78.0–100.0)
Platelets: 163 10*3/uL (ref 150.0–400.0)
RBC: 4.4 Mil/uL (ref 3.87–5.11)
RDW: 14 % (ref 11.5–15.5)
WBC: 4.2 10*3/uL (ref 4.0–10.5)

## 2021-11-06 LAB — TSH: TSH: 1.21 u[IU]/mL (ref 0.35–5.50)

## 2021-11-06 MED ORDER — CIPROFLOXACIN HCL 500 MG PO TABS: 500.0000 mg | ORAL_TABLET | Freq: Two times a day (BID) | ORAL | 2 refills | Status: DC

## 2021-11-06 MED ORDER — LEVOTHYROXINE SODIUM 50 MCG PO TABS: 50.0000 ug | ORAL_TABLET | Freq: Every day | ORAL | 3 refills | Status: DC

## 2021-11-06 MED ORDER — AMLODIPINE BESYLATE 5 MG PO TABS: 5.0000 mg | ORAL_TABLET | Freq: Every day | ORAL | 3 refills | Status: DC

## 2021-11-06 MED ORDER — PRAVASTATIN SODIUM 20 MG PO TABS: 20.0000 mg | ORAL_TABLET | Freq: Every day | ORAL | 3 refills | Status: DC

## 2021-11-06 NOTE — Assessment & Plan Note (Signed)
Uses cipro rarely for symptoms. Refill done for 10 with 2 refills done. Last filled 2021.

## 2021-11-06 NOTE — Assessment & Plan Note (Signed)
Checking TSH and adjust synthroid 50 mcg daily as needed. Refilled today.

## 2021-11-06 NOTE — Progress Notes (Signed)
   Subjective:   Patient ID: [name], female    DOB: 08-Jan-1941, 80 y.o.   MRN: [id number]  HPI The patient is here for physical.  PMH, [facility], social history reviewed and updated  Review of Systems  Constitutional: Negative.   HENT: Negative.    Eyes: Negative.   Respiratory:  Negative for cough, chest tightness and shortness of breath.   Cardiovascular:  Negative for chest pain, palpitations and leg swelling.  Gastrointestinal:  Negative for abdominal distention, abdominal pain, constipation, diarrhea, nausea and vomiting.       Bowel habit changes  Musculoskeletal: Negative.   Skin: Negative.   Neurological: Negative.   Psychiatric/Behavioral: Negative.      Objective:  Physical Exam Constitutional:      Appearance: She is well-developed.  HENT:     Head: Normocephalic and atraumatic.  Cardiovascular:     Rate and Rhythm: Normal rate and regular rhythm.  Pulmonary:     Effort: Pulmonary effort is normal. No respiratory distress.     Breath sounds: Normal breath sounds. No wheezing or rales.  Abdominal:     General: Bowel sounds are normal. There is no distension.     Palpations: Abdomen is soft.     Tenderness: There is no abdominal tenderness. There is no rebound.  Musculoskeletal:     Cervical back: Normal range of motion.  Skin:    General: Skin is warm and dry.  Neurological:     Mental Status: She is alert and oriented to person, place, and time.     Coordination: Coordination normal.     Vitals:   11/06/21 0855  BP: 104/68  Pulse: 77  Temp: 97.7 F (36.5 C)  TempSrc: Oral  SpO2: 97%  Weight: 155 lb (70.3 kg)  Height: 5\' 6"  (1.676 m)    Assessment & Plan:

## 2021-11-06 NOTE — Assessment & Plan Note (Signed)
Flu shot yearly. covid-19 counseled. Pneumonia complete. Shingrix complete. Tetanus due at pharmacy. Colonoscopy getting next week. Mammogram up to date, pap smear aged out and dexa up to date. Counseled about sun safety and mole surveillance. Counseled about the dangers of distracted driving. Given 10 year screening recommendations.

## 2021-11-06 NOTE — Patient Instructions (Signed)
We will check the labs today. 

## 2021-11-06 NOTE — Assessment & Plan Note (Signed)
Checking lipid panel and adjust pravastatin 20 mg daily as needed. 

## 2021-11-06 NOTE — Assessment & Plan Note (Signed)
BP at goal on amlodipine 5 mg daily. No dizziness. Checking CMP and adjust as needed.

## 2021-11-06 NOTE — Assessment & Plan Note (Signed)
Feels much improved energy wise off metoprolol. Continue to monitor.

## 2021-11-11 ENCOUNTER — Ambulatory Visit: Payer: Medicare PPO | Admitting: Internal Medicine

## 2021-11-14 DIAGNOSIS — D122 Benign neoplasm of ascending colon: Secondary | ICD-10-CM | POA: Diagnosis not present

## 2021-11-14 DIAGNOSIS — K649 Unspecified hemorrhoids: Secondary | ICD-10-CM | POA: Diagnosis not present

## 2021-11-14 DIAGNOSIS — Z86010 Personal history of colonic polyps: Secondary | ICD-10-CM | POA: Diagnosis not present

## 2021-11-14 DIAGNOSIS — D120 Benign neoplasm of cecum: Secondary | ICD-10-CM | POA: Diagnosis not present

## 2021-11-14 DIAGNOSIS — Z09 Encounter for follow-up examination after completed treatment for conditions other than malignant neoplasm: Secondary | ICD-10-CM | POA: Diagnosis not present

## 2021-11-14 DIAGNOSIS — K5730 Diverticulosis of large intestine without perforation or abscess without bleeding: Secondary | ICD-10-CM | POA: Diagnosis not present

## 2021-11-14 DIAGNOSIS — R634 Abnormal weight loss: Secondary | ICD-10-CM | POA: Diagnosis not present

## 2021-11-18 DIAGNOSIS — D122 Benign neoplasm of ascending colon: Secondary | ICD-10-CM | POA: Diagnosis not present

## 2021-11-18 DIAGNOSIS — D120 Benign neoplasm of cecum: Secondary | ICD-10-CM | POA: Diagnosis not present

## 2021-12-13 DIAGNOSIS — R634 Abnormal weight loss: Secondary | ICD-10-CM | POA: Diagnosis not present

## 2021-12-13 DIAGNOSIS — R194 Change in bowel habit: Secondary | ICD-10-CM | POA: Diagnosis not present

## 2022-04-26 IMAGING — CR DG HIP (WITH OR WITHOUT PELVIS) 2-3V*R*
3 series · 3 of 3 positions shown · non-contrast
Comparison: 03/21/2004

CLINICAL DATA: RIGHT hip pain no known injury.

EXAM:
DG HIP (WITH OR WITHOUT PELVIS) 2-3V RIGHT

[t pelvis a.p.]
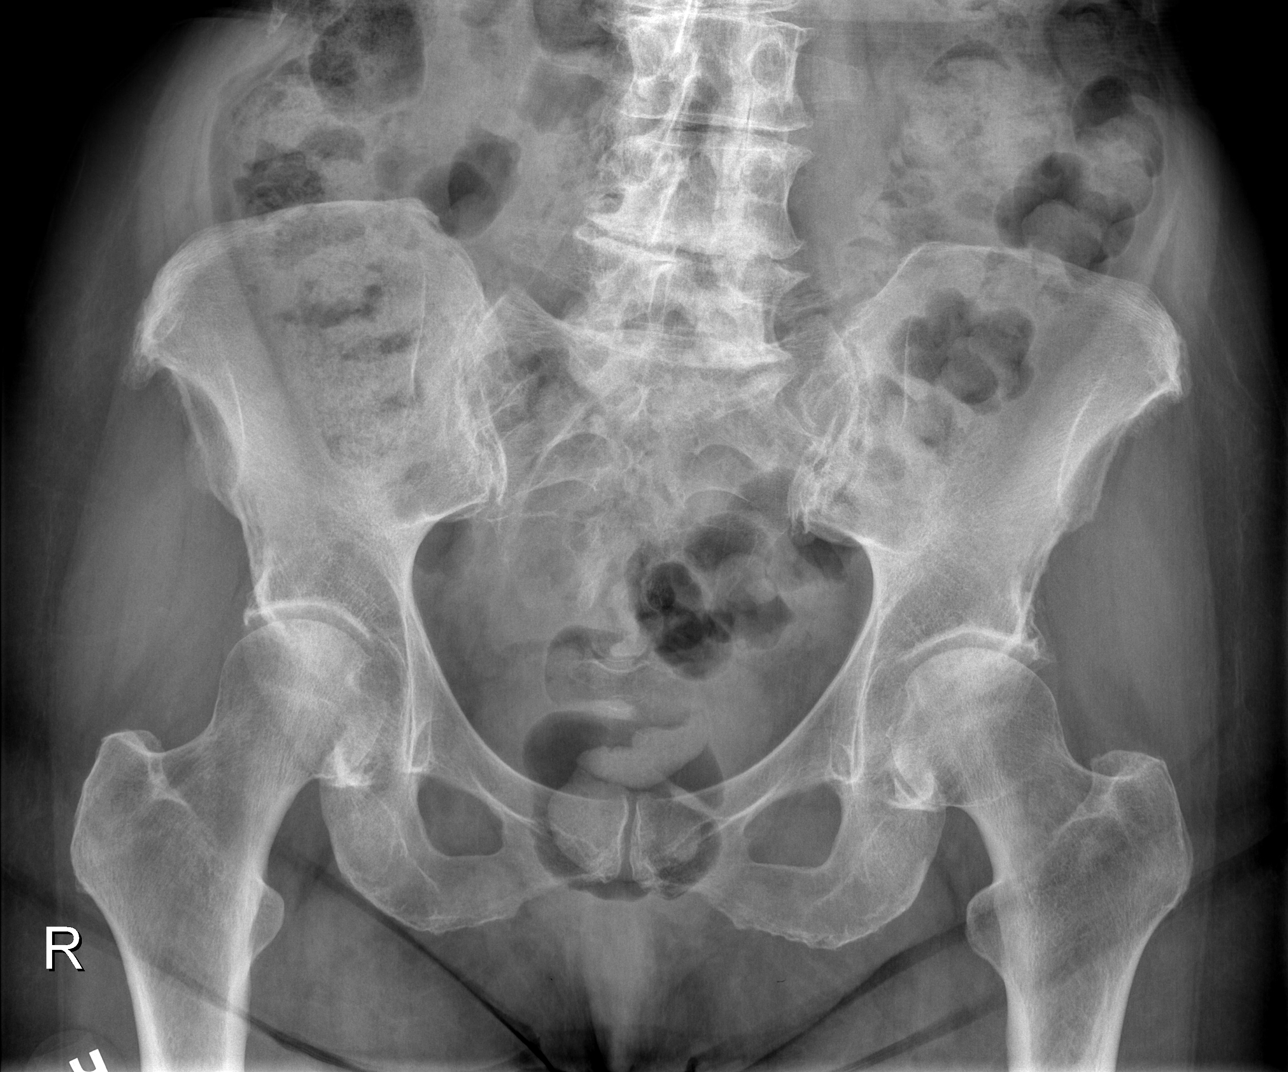

[t hip ap right]
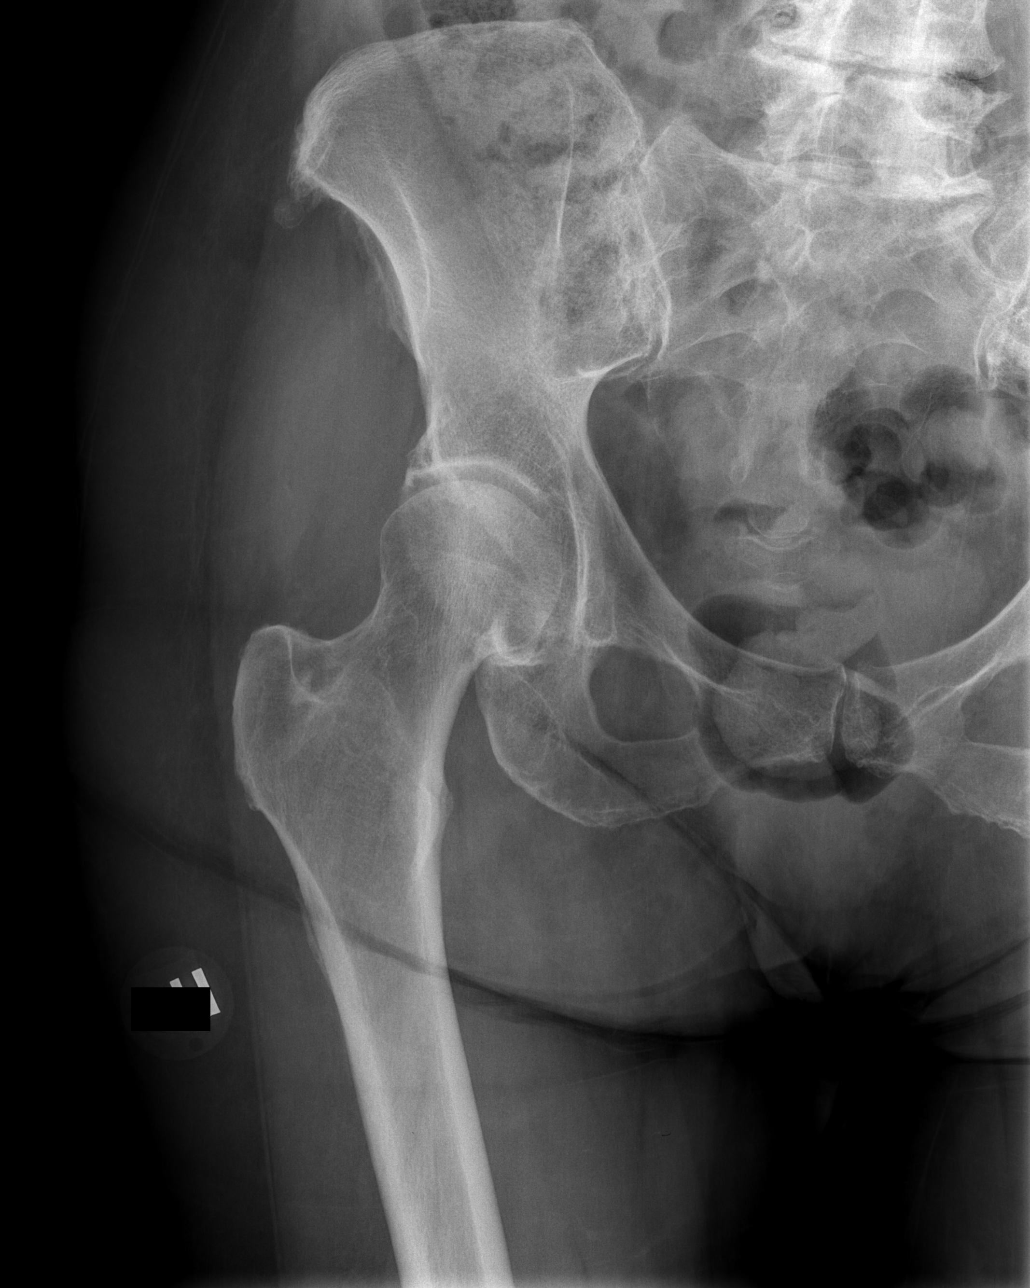

[t hip frog leg right]
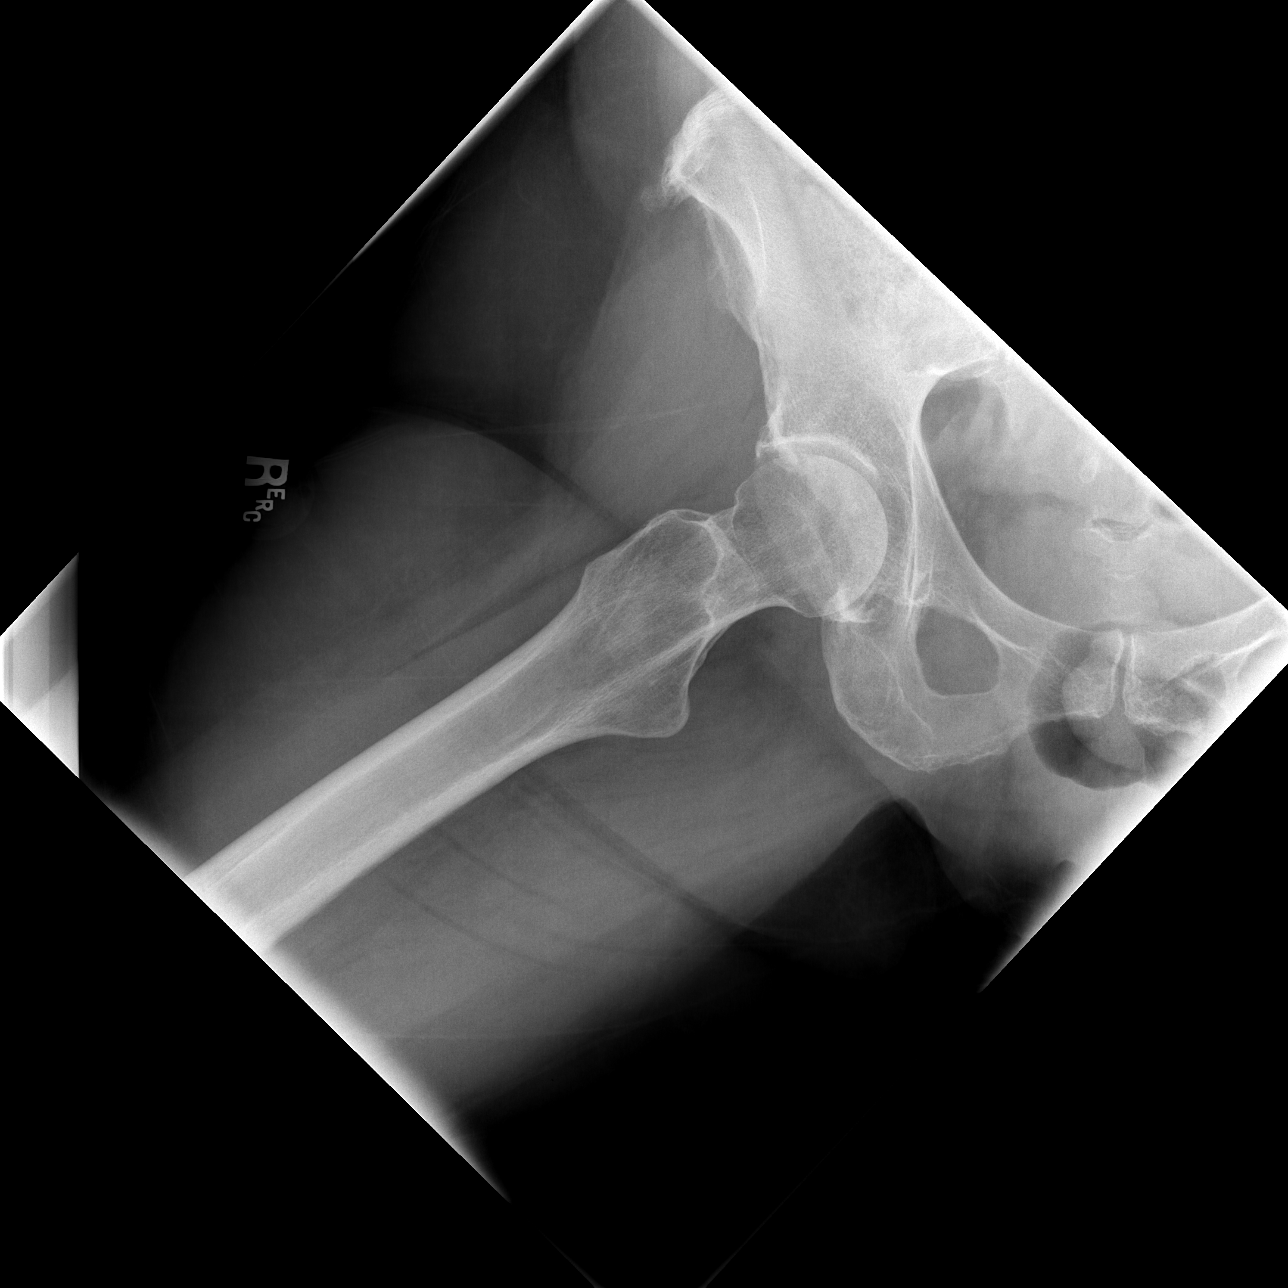

[3 of 3 positions shown; findings below may reference images not displayed]

FINDINGS: Degenerative changes of the hips bilaterally. Lumbar spine
degenerative changes incidentally noted. No sign of fracture or
dislocation.
IMPRESSION: Degenerative changes in the hips and lumbar spine. No sign of acute
abnormality.

## 2022-05-13 NOTE — Progress Notes (Signed)
Cardiology Office Note   Date:  05/14/2022   ID:  [name], [name] May 29, 1940, MRN [id number]  PCP:  Hoyt Koch, MD    No chief complaint on file.  LBBB  Wt Readings from Last 3 Encounters:  05/14/22 153 lb 12.8 oz (69.8 kg)  11/06/21 155 lb (70.3 kg)  02/11/21 165 lb (74.8 kg)       History of Present Illness: [name] is a 82 y.o. female   who is being seen today for the evaluation of LBBB at the request of Hoyt Koch, *.   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 She also has multinodular goiter and chronic hepatitis C (s/p treatment - virus free at 6 months) as well as hyperlipidemia."   1990 GI bleed.  Had a transfusion.  She got Hep C from the transfusion.    The LBBB was new in 2022 compared to 2020.  She has had palpitations for years.  Mostly isolated beats for many years.     Father with MI in his 29s.  Mother with CHF. Brother with SVT.     She continues to have occasional premature beats.    Denies : Chest pain. Dizziness. Leg edema. Nitroglycerin use. Orthopnea.  Paroxysmal nocturnal dyspnea. Shortness of breath. Syncope.    Very rare palpitations. Once a month.        Past Medical History:  Diagnosis Date   Chronic hepatitis C ([place]) 2014   DID HARVONI AND RIBIVORIN [place]    Headache    MIGRAINE AURA NO HEADACHE   History of blood transfusion 1990   AFTER GI BLEED   Hyperlipidemia    Hypertension    Multinodular goiter    SHRANK AWAY 25 YRS AGO   Osteoarthritis    OA   Palpitations    PACs & PVCs   Pneumonia YRS AGO    Past Surgical History:  Procedure Laterality Date   TOTAL KNEE ARTHROPLASTY Left 04/28/2016   Procedure: LEFT TOTAL KNEE ARTHROPLASTY;  Surgeon: Gaynelle Arabian, MD;  Location: [facility];  Service: Orthopedics;  Laterality: Left;   TOTAL KNEE ARTHROPLASTY Right 03/29/2018   Procedure: RIGHT TOTAL KNEE ARTHROPLASTY;  Surgeon:  Gaynelle Arabian, MD;  Location: [facility];  Service: Orthopedics;  Laterality: Right;  65mn   TUBAL LIGATION       Current Outpatient Medications  Medication Sig Dispense Refill   albuterol (PROAIR HFA) 108 (90 Base) MCG/ACT inhaler INHALE 1-2 PUFFS INTO THE LUNGS EVERY 4 (FOUR) HOURS AS NEEDED FOR WHEEZING OR SHORTNESS OF BREATH. 8.5 g 6   amLODipine (NORVASC) 5 MG tablet Take 1 tablet (5 mg total) by mouth daily. 90 tablet 3   cholecalciferol (VITAMIN D) 1000 units tablet Take 1,000 Units by mouth daily.      ciprofloxacin (CIPRO) 500 MG tablet Take 1 tablet (500 mg total) by mouth 2 (two) times daily. 10 tablet 2   levothyroxine (SYNTHROID) 50 MCG tablet Take 1 tablet (50 mcg total) by mouth daily before breakfast. 90 tablet 3   pravastatin (PRAVACHOL) 20 MG tablet Take 1 tablet (20 mg total) by mouth daily. 90 tablet 3   phenazopyridine (PYRIDIUM) 100 MG tablet Take 100 mg by mouth as needed.     No current facility-administered medications for this visit.    Allergies:   Meperidine hcl, Nitrofurantoin, Griseofulvin, and Penicillins    Social  History:  The patient  reports that she has never smoked. She has never used smokeless tobacco. She reports current alcohol use. She reports that she does not use drugs.   Family History:  The patient's family history includes COPD in her father; Heart disease in her father and mother.    ROS:  Please see the history of present illness.   Otherwise, review of systems are positive for weight loss.   All other systems are reviewed and negative.    PHYSICAL EXAM: VS:  BP 124/78   Pulse 65   Ht 5' 6"$  (1.676 m)   Wt 153 lb 12.8 oz (69.8 kg)   SpO2 96%   BMI 24.82 kg/m  , BMI Body mass index is 24.82 kg/m. GEN: Well nourished, well developed, in no acute distress HEENT: normal Neck: no JVD, carotid bruits, or masses Cardiac: RRR; no murmurs, rubs, or gallops,no edema  Respiratory:  clear to auscultation bilaterally, normal work of  breathing GI: soft, nontender, nondistended, + BS MS: no deformity or atrophy Skin: warm and dry, no rash Neuro:  Strength and sensation are intact Psych: euthymic mood, full affect   EKG:   The ekg ordered today demonstrates NSR, LBBB   Recent Labs: 11/06/2021: ALT 13; BUN 17; Creatinine, Ser 0.68; Hemoglobin 13.1; Platelets 163.0; Potassium 4.2; Sodium 141; TSH 1.21   Lipid Panel    Component Value Date/Time   CHOL 170 11/06/2021 0916   TRIG 78.0 11/06/2021 0916   HDL 56.50 11/06/2021 0916   CHOLHDL 3 11/06/2021 0916   VLDL 15.6 11/06/2021 0916   LDLCALC 98 11/06/2021 0916   LDLDIRECT 153.4 04/07/2007 0744     Other studies Reviewed: Additional studies/ records that were reviewed today with results demonstrating: negative stress echo in 2008.   ASSESSMENT AND PLAN:  LBBB/ abnormal ECG: Staying active.  No cardiac symptoms.   PACs/PVCs: worse with more caffeine.  Sx well controlled.  HTN: The current medical regimen is effective;  continue present plan and medications.  Continue regular exercise.  She eats a healthy diet.   Current medicines are reviewed at length with the patient today.  The patient concerns regarding her medicines were addressed.  The following changes have been made:  No change  Labs/ tests ordered today include:  No orders of the defined types were placed in this encounter.   Recommend 150 minutes/week of aerobic exercise Low fat, low carb, high fiber diet recommended  Disposition:   FU in  1year   Signed, Larae Grooms, MD  05/14/2022 4:20 PM    [facility] [address], [place], [place]  [address] Phone: [phone]; Fax: [phone]

## 2022-05-14 ENCOUNTER — Encounter: Payer: Self-pay | Admitting: Interventional Cardiology

## 2022-05-14 ENCOUNTER — Ambulatory Visit: Payer: Medicare PPO | Attending: Interventional Cardiology | Admitting: Interventional Cardiology

## 2022-05-14 VITALS — BP 124/78 | HR 65 | Ht 66.0 in | Wt 153.8 lb

## 2022-05-14 DIAGNOSIS — I10 Essential (primary) hypertension: Secondary | ICD-10-CM | POA: Diagnosis not present

## 2022-05-14 DIAGNOSIS — I493 Ventricular premature depolarization: Secondary | ICD-10-CM

## 2022-05-14 DIAGNOSIS — I447 Left bundle-branch block, unspecified: Secondary | ICD-10-CM | POA: Diagnosis not present

## 2022-05-14 DIAGNOSIS — I491 Atrial premature depolarization: Secondary | ICD-10-CM

## 2022-05-14 NOTE — Patient Instructions (Signed)
Medication Instructions:  Your physician recommends that you continue on your current medications as directed. Please refer to the Current Medication list given to you today.  *If you need a refill on your cardiac medications before your next appointment, please call your pharmacy*  Lab Work: If you have labs (blood work) drawn today and your tests are completely normal, you will receive your results only by: [place] (if you have MyChart) OR A paper copy in the mail If you have any lab test that is abnormal or we need to change your treatment, we will call you to review the results.  Testing/Procedures: None ordered today.  Follow-Up: At [facility], you and your health needs are our priority.  As part of our continuing mission to provide you with exceptional heart care, we have created designated Provider Care Teams.  These Care Teams include your primary Cardiologist (physician) and Advanced Practice Providers (APPs -  Physician Assistants and Nurse Practitioners) who all work together to provide you with the care you need, when you need it.  We recommend signing up for the patient portal called "MyChart".  Sign up information is provided on this After Visit Summary.  MyChart is used to connect with patients for Virtual Visits (Telemedicine).  Patients are able to view lab/test results, encounter notes, upcoming appointments, etc.  Non-urgent messages can be sent to your provider as well.   To learn more about what you can do with MyChart, go to [email or web address].    Your next appointment:   1 year(s)  Provider:   Larae Grooms, MD

## 2022-07-21 ENCOUNTER — Emergency Department (HOSPITAL_COMMUNITY)
Admission: EM | Admit: 2022-07-21 | Discharge: 2022-07-21 | Disposition: A | Discharge status: Home / Self Care | Payer: Medicare PPO | Attending: Emergency Medicine | Admitting: Emergency Medicine

## 2022-07-21 ENCOUNTER — Emergency Department (HOSPITAL_COMMUNITY): Payer: Medicare PPO

## 2022-07-21 ENCOUNTER — Other Ambulatory Visit: Payer: Self-pay

## 2022-07-21 DIAGNOSIS — R0602 Shortness of breath: Secondary | ICD-10-CM | POA: Diagnosis not present

## 2022-07-21 DIAGNOSIS — J45909 Unspecified asthma, uncomplicated: Secondary | ICD-10-CM | POA: Diagnosis not present

## 2022-07-21 DIAGNOSIS — R059 Cough, unspecified: Secondary | ICD-10-CM | POA: Diagnosis not present

## 2022-07-21 DIAGNOSIS — J40 Bronchitis, not specified as acute or chronic: Secondary | ICD-10-CM

## 2022-07-21 DIAGNOSIS — J209 Acute bronchitis, unspecified: Secondary | ICD-10-CM | POA: Insufficient documentation

## 2022-07-21 LAB — CBC WITH DIFFERENTIAL/PLATELET
Abs Immature Granulocytes: 0.01 10*3/uL (ref 0.00–0.07)
Basophils Absolute: 0 10*3/uL (ref 0.0–0.1)
Basophils Relative: 1 %
Eosinophils Absolute: 0 10*3/uL (ref 0.0–0.5)
Eosinophils Relative: 1 %
HCT: 40.7 % (ref 36.0–46.0)
Hemoglobin: 13.1 g/dL (ref 12.0–15.0)
Immature Granulocytes: 0 %
Lymphocytes Relative: 16 %
Lymphs Abs: 0.6 10*3/uL — ABNORMAL LOW (ref 0.7–4.0)
MCH: 29.7 pg (ref 26.0–34.0)
MCHC: 32.2 g/dL (ref 30.0–36.0)
MCV: 92.3 fL (ref 80.0–100.0)
Monocytes Absolute: 0.3 10*3/uL (ref 0.1–1.0)
Monocytes Relative: 8 %
Neutro Abs: 2.5 10*3/uL (ref 1.7–7.7)
Neutrophils Relative %: 74 %
Platelets: 126 10*3/uL — ABNORMAL LOW (ref 150–400)
RBC: 4.41 MIL/uL (ref 3.87–5.11)
RDW: 13.5 % (ref 11.5–15.5)
WBC: 3.4 10*3/uL — ABNORMAL LOW (ref 4.0–10.5)
nRBC: 0 % (ref 0.0–0.2)

## 2022-07-21 LAB — BASIC METABOLIC PANEL
Anion gap: 9 (ref 5–15)
BUN: 11 mg/dL (ref 8–23)
CO2: 24 mmol/L (ref 22–32)
Calcium: 9 mg/dL (ref 8.9–10.3)
Chloride: 104 mmol/L (ref 98–111)
Creatinine, Ser: 0.59 mg/dL (ref 0.44–1.00)
GFR, Estimated: >60 mL/min (ref >60)
Glucose, Bld: 103 mg/dL — ABNORMAL HIGH (ref 70–99)
Potassium: 4 mmol/L (ref 3.5–5.1)
Sodium: 137 mmol/L (ref 135–145)

## 2022-07-21 MED ORDER — ALBUTEROL SULFATE HFA 108 (90 BASE) MCG/ACT IN AERS
4.0000 | INHALATION_SPRAY | Freq: Once | RESPIRATORY_TRACT | Status: AC
  Administered 2022-07-21: 4 via RESPIRATORY_TRACT
  Filled 2022-07-21: qty 6.7

## 2022-07-21 MED ORDER — DOXYCYCLINE HYCLATE 100 MG PO CAPS: 100.0000 mg | ORAL_CAPSULE | Freq: Two times a day (BID) | ORAL | 0 refills | Status: AC

## 2022-07-21 MED ORDER — BENZONATATE 100 MG PO CAPS
200.0000 mg | ORAL_CAPSULE | Freq: Once | ORAL | Status: AC
  Administered 2022-07-21: 200 mg via ORAL
  Filled 2022-07-21: qty 2

## 2022-07-21 MED ORDER — BENZONATATE 100 MG PO CAPS: 100.0000 mg | ORAL_CAPSULE | Freq: Three times a day (TID) | ORAL | 0 refills | Status: DC | PRN

## 2022-07-21 MED ORDER — PREDNISONE 20 MG PO TABS: 40.0000 mg | ORAL_TABLET | Freq: Every day | ORAL | 0 refills | Status: AC

## 2022-07-21 MED ORDER — DOXYCYCLINE HYCLATE 100 MG PO TABS
100.0000 mg | ORAL_TABLET | Freq: Once | ORAL | Status: AC
  Administered 2022-07-21: 100 mg via ORAL
  Filled 2022-07-21: qty 1

## 2022-07-21 MED ORDER — PREDNISONE 20 MG PO TABS
40.0000 mg | ORAL_TABLET | Freq: Once | ORAL | Status: AC
  Administered 2022-07-21: 40 mg via ORAL
  Filled 2022-07-21: qty 2

## 2022-07-21 MED ORDER — SODIUM CHLORIDE 0.9 % IV BOLUS
500.0000 mL | Freq: Once | INTRAVENOUS | Status: AC
  Administered 2022-07-21: 500 mL via INTRAVENOUS

## 2022-07-21 MED ORDER — AEROCHAMBER Z-STAT PLUS/MEDIUM MISC
Status: AC
  Filled 2022-07-21: qty 1

## 2022-07-21 NOTE — ED Provider Notes (Signed)
[place] EMERGENCY DEPARTMENT AT [facility] Provider Note   CSN: [id number] Arrival date & time: 07/21/22  1911     History  Chief Complaint  Patient presents with   Cough   Shortness of Breath    [name] is a 82 y.o. female.  Patient here with cough and shortness of breath.  History of asthma and now may be some COPD/reactive airway process.  She had exposures to children recently that likely had colds as well.  She has had some nasal congestion and cough.  Denies any sputum production.  She has had chills.  She has been taking Tylenol.  She denies any nausea vomiting diarrhea.  No pain with urination.  The history is provided by the patient.       Home Medications Prior to Admission medications   Medication Sig Start Date End Date Taking? Authorizing Provider  benzonatate (TESSALON) 100 MG capsule Take 1 capsule (100 mg total) by mouth 3 (three) times daily as needed for up to 15 doses for cough. 07/21/22  Yes Mykai Wendorf, DO  doxycycline (VIBRAMYCIN) 100 MG capsule Take 1 capsule (100 mg total) by mouth 2 (two) times daily for 7 days. 07/21/22 07/28/22 Yes Ima Hafner, DO  predniSONE (DELTASONE) 20 MG tablet Take 2 tablets (40 mg total) by mouth daily for 4 days. 07/21/22 07/25/22 Yes Fusaye Wachtel, DO  albuterol (PROAIR HFA) 108 (90 Base) MCG/ACT inhaler INHALE 1-2 PUFFS INTO THE LUNGS EVERY 4 (FOUR) HOURS AS NEEDED FOR WHEEZING OR SHORTNESS OF BREATH. 09/21/19   Myrlene Broker, MD  amLODipine (NORVASC) 5 MG tablet Take 1 tablet (5 mg total) by mouth daily. 11/06/21   Myrlene Broker, MD  cholecalciferol (VITAMIN D) 1000 units tablet Take 1,000 Units by mouth daily.     [provider]  ciprofloxacin (CIPRO) 500 MG tablet Take 1 tablet (500 mg total) by mouth 2 (two) times daily. 11/06/21   Myrlene Broker, MD  levothyroxine (SYNTHROID) 50 MCG tablet Take 1 tablet (50 mcg total) by mouth daily before breakfast. 11/06/21   Myrlene Broker, MD  phenazopyridine (PYRIDIUM) 100 MG tablet Take 100 mg by mouth as needed.    [provider]  pravastatin (PRAVACHOL) 20 MG tablet Take 1 tablet (20 mg total) by mouth daily. 11/06/21   Myrlene Broker, MD      Allergies    Meperidine hcl, Nitrofurantoin, Griseofulvin, and Penicillins    Review of Systems   Review of Systems  Physical Exam Updated Vital Signs BP (!) 143/83 (BP Location: Left Arm)   Pulse 87   Temp 99.4 F (37.4 C) (Oral)   Resp 17   SpO2 99%  Physical Exam Vitals and nursing note reviewed.  Constitutional:      General: She is not in acute distress.    Appearance: She is well-developed. She is not ill-appearing.  HENT:     Head: Normocephalic and atraumatic.     Mouth/Throat:     Mouth: Mucous membranes are moist.  Eyes:     Conjunctiva/sclera: Conjunctivae normal.     Pupils: Pupils are equal, round, and reactive to light.  Cardiovascular:     Rate and Rhythm: Normal rate and regular rhythm.     Pulses: Normal pulses.     Heart sounds: Normal heart sounds. No murmur heard. Pulmonary:     Effort: Pulmonary effort is normal. No respiratory distress.     Breath sounds: Normal breath sounds.  Abdominal:  Palpations: Abdomen is soft.     Tenderness: There is no abdominal tenderness.  Musculoskeletal:        General: No swelling. Normal range of motion.     Cervical back: Normal range of motion and neck supple.     Right lower leg: No edema.     Left lower leg: No edema.  Skin:    General: Skin is warm and dry.     Capillary Refill: Capillary refill takes less than 2 seconds.  Neurological:     General: No focal deficit present.     Mental Status: She is alert.  Psychiatric:        Mood and Affect: Mood normal.     ED Results / Procedures / Treatments   Labs (all labs ordered are listed, but only abnormal results are displayed) Labs Reviewed  CBC WITH DIFFERENTIAL/PLATELET - Abnormal; Notable for the following  components:      Result Value   WBC 3.4 (*)    Platelets 126 (*)    Lymphs Abs 0.6 (*)    All other components within normal limits  BASIC METABOLIC PANEL - Abnormal; Notable for the following components:   Glucose, Bld 103 (*)    All other components within normal limits    EKG None  Radiology DG Chest 2 View  Result Date: 07/21/2022 CLINICAL DATA:  Productive cough and shortness of breath EXAM: CHEST - 2 VIEW COMPARISON:  None Available. FINDINGS: The heart size and mediastinal contours are within normal limits. Both lungs are clear. The visualized skeletal structures are unremarkable. IMPRESSION: No active cardiopulmonary disease. Electronically Signed   By: Alcide Clever M.D.   On: 07/21/2022 20:07    Procedures Procedures    Medications Ordered in ED Medications  albuterol (VENTOLIN HFA) 108 (90 Base) MCG/ACT inhaler 4 puff (4 puffs Inhalation Given 07/21/22 2041)  sodium chloride 0.9 % bolus 500 mL (500 mLs Intravenous New Bag/Given 07/21/22 2049)  predniSONE (DELTASONE) tablet 40 mg (40 mg Oral Given 07/21/22 2039)  doxycycline (VIBRA-TABS) tablet 100 mg (100 mg Oral Given 07/21/22 2039)  benzonatate (TESSALON) capsule 200 mg (200 mg Oral Given 07/21/22 2039)  aerochamber Z-Stat Plus/medium (  Given 07/21/22 2041)    ED Course/ Medical Decision Making/ A&P                             Medical Decision Making Risk Prescription drug management.   [name] is here with cough.  Unremarkable vitals.  No fever.  History of reactive airway disease.  Differential diagnosis likely viral process versus pneumonia.  I have no concern for ACS or PE.  CBC, BMP and chest x-ray were ordered.  Patient was given IV fluids, albuterol treatment, prednisone, doxycycline.  Chest x-ray showed no obvious focal pneumonia.  No volume overload per my review and interpretation.  Per my review and interpretation the labs is no significant anemia or electrolyte abnormality or kidney injury.  Overall  suspect that she has a viral bronchitis.  Will treat with prednisone and antibiotics.  She has been given albuterol inhaler.  Will prescribe Tessalon Perles.  Patient discharged in good condition.  This chart was dictated using voice recognition software.  Despite best efforts to proofread,  errors can occur which can change the documentation meaning.         Final Clinical Impression(s) / ED Diagnoses Final diagnoses:  Bronchitis    Rx / DC Orders  ED Discharge Orders          Ordered    benzonatate (TESSALON) 100 MG capsule  3 times daily PRN        07/21/22 2031    doxycycline (VIBRAMYCIN) 100 MG capsule  2 times daily        07/21/22 2031    predniSONE (DELTASONE) 20 MG tablet  Daily        07/21/22 2031              Virgina Norfolk, DO 07/21/22 2100

## 2022-07-21 NOTE — ED Triage Notes (Signed)
Pt arrives POC c/o productive cough, exertional SOB and cold like symptoms that started Wednesday and has progressively been worsening. Denies fevers, but c/o feeling feverish. Has been taking tylenol, other OTC meds at home, as well as albuterol inhaler at home without relief. No distress noted while at rest.

## 2022-07-21 NOTE — Discharge Instructions (Addendum)
Take next dose of antibiotic and steroid tomorrow.  Use inhaler 2 to 4 puffs every 4-6 hours as needed.  I prescribed you Tessalon Perles for cough as well.

## 2022-07-21 NOTE — ED Provider Triage Note (Signed)
Emergency Medicine Provider Triage Evaluation Note  [name] , a 82 y.o. female  was evaluated in triage.  Pt complains of shortness of breath, and fatigue.  States administratively started as URI symptoms.  Cough is nonproductive.  Denies any chest pain, peripheral edema.  Review of Systems  Positive: As above Negative: As above  Physical Exam  BP (!) 143/83 (BP Location: Left Arm)   Pulse 87   Temp 99.4 F (37.4 C) (Oral)   Resp 17   SpO2 99%  Gen:   Awake, no distress   Resp:  Normal effort  MSK:   Moves extremities without difficulty  Other:    Medical Decision Making  Medically screening exam initiated at 7:44 PM.  Appropriate orders placed.  AUDI CONOVER was informed that the remainder of the evaluation will be completed by another provider, this initial triage assessment does not replace that evaluation, and the importance of remaining in the ED until their evaluation is complete.     Marita Kansas, PA-C 07/21/22 1945

## 2022-07-22 ENCOUNTER — Encounter: Payer: Self-pay | Admitting: Internal Medicine

## 2022-07-22 MED ORDER — FLUTICASONE FUROATE-VILANTEROL 100-25 MCG/ACT IN AEPB: 1.0000 | INHALATION_SPRAY | Freq: Every day | RESPIRATORY_TRACT | 0 refills | Status: DC

## 2022-07-22 NOTE — Addendum Note (Signed)
Addended by: Hillard Danker A on: 07/22/2022 04:54 PM   Modules accepted: Orders

## 2022-07-22 NOTE — Telephone Encounter (Signed)
Schedule a virtual 

## 2022-07-24 ENCOUNTER — Encounter: Payer: Self-pay | Admitting: *Deleted

## 2022-07-24 ENCOUNTER — Telehealth: Payer: Self-pay | Admitting: *Deleted

## 2022-07-24 NOTE — Transitions of Care (Post Inpatient/ED Visit) (Signed)
07/24/2022  Name: [name] MRN: [id number] DOB: 1940-04-09  Today's TOC FU Call Status: Today's TOC FU Call Status:: Successful TOC FU Call Competed TOC FU Call Complete Date: 07/24/22  ED EMMI Red Alert notification on 07/24/22 from ED visit 07/21/22- EMMI call placed 07/23/22: "No scheduled follow up"  Transition Care Management Follow-up Telephone Call Date of Discharge: 07/21/22 Discharge Facility: [facility]) Type of Discharge: Emergency Department Reason for ED Visit: Respiratory Respiratory Diagnosis:  (bronchitis) How have you been since you were released from the hospital?: Worse ("I don't feel good at all; I can't stop coughing.  I am pretty miserable.  I have and am taking all of the medicines they gave me at the ER, it just seems like I am not getting any better") Any questions or concerns?: Yes Patient Questions/Concerns:: Ptient coughing constantly during ED follow up call; states feels terrible/ worse; confirms she is taking antibiotic/ prednisone/ inhalers/ tessalon as prescribed Patient Questions/Concerns Addressed: Other: (care coordination outreach in real-time with scheduling care guide to successfully schedule ED follow up PCP appointment 07/25/22)  Items Reviewed: Did you receive and understand the discharge instructions provided?: Yes (thoroughly reviewed with patient who verbalizes good understanding of same) Medications obtained,verified, and reconciled?: Yes (Medications Reviewed) (Full medication reconciliation/ review completed; no concerns or discrepancies identified; confirmed patient obtained/ is taking all newly Rx'd medications as instructed; self-manages medications and denies questions/ concerns around medications today) Any new allergies since your discharge?: No Dietary orders reviewed?: Yes Type of Diet Ordered:: Regular Do you have support at home?: Yes People in Home: spouse Name of Support/Comfort Primary Source: Reports independent in  self-care activities; supportive spouse assists as/ if needed/ indicated  Medications Reviewed Today: Medications Reviewed Today     Reviewed by Michaela Corner, RN (Registered Nurse) on 07/24/22 at 1444  Med List Status: <None>   Medication Order Taking? Sig Documenting Provider Last Dose Status Informant  albuterol (PROAIR HFA) 108 (90 Base) MCG/ACT inhaler [id number]  INHALE 1-2 PUFFS INTO THE LUNGS EVERY 4 (FOUR) HOURS AS NEEDED FOR WHEEZING OR SHORTNESS OF BREATH. Myrlene Broker, MD  Active   amLODipine (NORVASC) 5 MG tablet [id number]  Take 1 tablet (5 mg total) by mouth daily. Myrlene Broker, MD  Active   benzonatate (TESSALON) 100 MG capsule [id number]  Take 1 capsule (100 mg total) by mouth 3 (three) times daily as needed for up to 15 doses for cough. Virgina Norfolk, DO  Active   cholecalciferol (VITAMIN D) 1000 units tablet [id number]  Take 1,000 Units by mouth daily.  [provider]  Active Self           Med Note Joellyn Quails, MEGAN E   Tue Aug 23, 2019  2:40 PM)    ciprofloxacin (CIPRO) 500 MG tablet [id number]  Take 1 tablet (500 mg total) by mouth 2 (two) times daily. Myrlene Broker, MD  Active   doxycycline (VIBRAMYCIN) 100 MG capsule [id number]  Take 1 capsule (100 mg total) by mouth 2 (two) times daily for 7 days. Curatolo, Adam, DO  Active   fluticasone furoate-vilanterol (BREO ELLIPTA) 100-25 MCG/ACT AEPB [id number]  Inhale 1 puff into the lungs daily. Myrlene Broker, MD  Active   levothyroxine (SYNTHROID) 50 MCG tablet [id number]  Take 1 tablet (50 mcg total) by mouth daily before breakfast. Myrlene Broker, MD  Active   phenazopyridine (PYRIDIUM) 100 MG tablet [id number]  Take 100 mg by mouth as needed. [provider]  Active   pravastatin (PRAVACHOL) 20 MG tablet [id number]  Take 1 tablet (20 mg total) by mouth daily. Myrlene Broker, MD  Active   predniSONE (DELTASONE) 20 MG tablet [id number]  Take 2 tablets (40 mg total) by mouth  daily for 4 days. Virgina Norfolk, DO  Active             Home Care and Equipment/Supplies: Were Home Health Services Ordered?: No Any new equipment or medical supplies ordered?: No  Functional Questionnaire: Do you need assistance with bathing/showering or dressing?: No Do you need assistance with meal preparation?: No Do you need assistance with eating?: No Do you have difficulty maintaining continence: No Do you need assistance with getting out of bed/getting out of a chair/moving?: No Do you have difficulty managing or taking your medications?: No  Follow up appointments reviewed: PCP Follow-up appointment confirmed?: Yes Date of PCP follow-up appointment?: 07/25/22 (care coordination outreach in real-time with scheduling care guide to successfully schedule ED follow up PCP appointment) Follow-up Provider: PCP Specialist Hospital Follow-up appointment confirmed?: NA Do you need transportation to your follow-up appointment?: No Do you understand care options if your condition(s) worsen?: Yes-patient verbalized understanding  SDOH Interventions Today    Flowsheet Row Most Recent Value  SDOH Interventions   Food Insecurity Interventions Intervention Not Indicated  Transportation Interventions Intervention Not Indicated  [drives self,  husband assists as indicated]      TOC Interventions Today    Flowsheet Row Most Recent Value  TOC Interventions   TOC Interventions Discussed/Reviewed TOC Interventions Discussed, Arranged PCP follow up within 7 days/Care Guide scheduled  [Patient declines need for ongoing/ further care coordination outreach,  no care coordination needs identified at time of TOC call today,  provided my direct contact information should questions/ concerns/ needs arise post-TOC call]      Interventions Today    Flowsheet Row Most Recent Value  Chronic Disease   Chronic disease during today's visit Other  [ED visit- bronchitis]  General Interventions    General Interventions Discussed/Reviewed General Interventions Discussed, Doctor Visits  Doctor Visits Discussed/Reviewed Doctor Visits Discussed, PCP  PCP/Specialist Visits Compliance with follow-up visit  Nutrition Interventions   Nutrition Discussed/Reviewed Nutrition Discussed  Pharmacy Interventions   Pharmacy Dicussed/Reviewed Pharmacy Topics Discussed  [Full medication review with updating medication list in EHR per patient report]      Caryl Pina, RN, BSN, CCRN Alumnus RN CM Care Coordination/ Transition of Care- [facility] Care Management [phone]: direct office

## 2022-07-25 ENCOUNTER — Ambulatory Visit: Payer: Medicare PPO | Admitting: Internal Medicine

## 2022-07-25 ENCOUNTER — Encounter: Payer: Self-pay | Admitting: Internal Medicine

## 2022-07-25 ENCOUNTER — Other Ambulatory Visit: Payer: Self-pay | Admitting: Internal Medicine

## 2022-07-25 VITALS — BP 120/84 | HR 61 | Temp 98.1°F | Ht 66.0 in | Wt 154.0 lb

## 2022-07-25 DIAGNOSIS — R052 Subacute cough: Secondary | ICD-10-CM | POA: Diagnosis not present

## 2022-07-25 MED ORDER — FLUTICASONE PROPIONATE HFA 220 MCG/ACT IN AERO: 1.0000 | INHALATION_SPRAY | Freq: Two times a day (BID) | RESPIRATORY_TRACT | 0 refills | Status: DC

## 2022-07-25 MED ORDER — HYDROCODONE BIT-HOMATROP MBR 5-1.5 MG/5ML PO SOLN: 5.0000 mL | Freq: Four times a day (QID) | ORAL | 0 refills | Status: DC | PRN

## 2022-07-25 NOTE — Assessment & Plan Note (Signed)
Suspect inflammatory coughing versus cough variant asthma. Rx fluticasone 220 mcg BID to help and hydromet cough medicine to help with cough at night time for sleep. Continue prednisone and doxycycline until gone although we talked about lack of true indication for doxycycline. Has albuterol inhaler which she uses rarely due to jitters with taking.

## 2022-07-25 NOTE — Progress Notes (Signed)
   Subjective:   Patient ID: [name], female    DOB: 1941-03-03, 82 y.o.   MRN: [id number]  HPI The patient is a 82 YO female coming in for ER follow up. Still with a lot of coughing. No SOB except during coughing fits. Some rhonchi type sound prior to coughing fits. No fevers or chills. Taking prednisone and doxycycline.  Review of Systems  Constitutional:  Positive for activity change and appetite change.  HENT: Negative.    Eyes: Negative.   Respiratory:  Positive for cough. Negative for chest tightness and shortness of breath.   Cardiovascular:  Negative for chest pain, palpitations and leg swelling.  Gastrointestinal:  Negative for abdominal distention, abdominal pain, constipation, diarrhea, nausea and vomiting.  Musculoskeletal: Negative.   Skin: Negative.   Neurological: Negative.   Psychiatric/Behavioral:  Positive for sleep disturbance.     Objective:  Physical Exam Constitutional:      Appearance: She is well-developed.  HENT:     Head: Normocephalic and atraumatic.  Cardiovascular:     Rate and Rhythm: Normal rate and regular rhythm.  Pulmonary:     Effort: Pulmonary effort is normal. No respiratory distress.     Breath sounds: Rhonchi present. No wheezing or rales.     Comments: Rhonchi at the bases Abdominal:     General: Bowel sounds are normal. There is no distension.     Palpations: Abdomen is soft.     Tenderness: There is no abdominal tenderness. There is no rebound.  Musculoskeletal:     Cervical back: Normal range of motion.  Skin:    General: Skin is warm and dry.  Neurological:     Mental Status: She is alert and oriented to person, place, and time.     Coordination: Coordination normal.     Vitals:   07/25/22 1519  BP: 120/84  Pulse: 61  Temp: 98.1 F (36.7 C)  TempSrc: Oral  SpO2: 98%  Weight: 154 lb (69.9 kg)  Height: 5\' 6"  (1.676 m)    Assessment & Plan:  Visit time 25 minutes in face to face communication with patient and  coordination of care, additional 5 minutes spent in record review, coordination or care, ordering tests, communicating/referring to other healthcare professionals, documenting in medical records all on the same day of the visit for total time 30 minutes spent on the visit.

## 2022-07-25 NOTE — Patient Instructions (Addendum)
We have sent in the flovent to take 1 puff twice a day.   We have sent in the cough medicine.

## 2022-07-28 NOTE — Telephone Encounter (Signed)
Rec'd msg : Alternative Requested:NOT COVERED. ../lmb 

## 2022-07-29 ENCOUNTER — Telehealth: Payer: Self-pay

## 2022-07-29 NOTE — Telephone Encounter (Signed)
Transition Care Management Unsuccessful Follow-up Telephone Call  Date of discharge and from where:  [facility] 4/29  Attempts:  1st Attempt  Reason for unsuccessful TCM follow-up call:  Left voice message   Lenard Forth [facility] Guide, [facility] [phone] [address] [place], [place], [place] [address] Phone: [phone] Email: [email or web address]

## 2022-07-29 NOTE — Telephone Encounter (Signed)
Transition Care Management Follow-up Telephone Call Date of discharge and from where: 4/29 [facility]  How have you been since you were released from the hospital? Better  Any questions or concerns? No  Items Reviewed: Did the pt receive and understand the discharge instructions provided? Yes  Medications obtained and verified? Yes  Other? No  Any new allergies since your discharge? No  Dietary orders reviewed? No Do you have support at home? Yes    Follow up appointments reviewed:  PCP Hospital f/u appt confirmed? Yes  Scheduled to see  on  @ . Specialist Hospital f/u appt confirmed?     Scheduled to see  on  @ . Are transportation arrangements needed? No  If their condition worsens, is the pt aware to call PCP or go to the Emergency Dept.? Yes Was the patient provided with contact information for the PCP's office or ED? Yes Was to pt encouraged to call back with questions or concerns? Yes

## 2022-07-30 ENCOUNTER — Encounter: Payer: Self-pay | Admitting: Internal Medicine

## 2022-07-31 MED ORDER — ARNUITY ELLIPTA 200 MCG/ACT IN AEPB: 200.0000 ug | INHALATION_SPRAY | Freq: Two times a day (BID) | RESPIRATORY_TRACT | 1 refills | Status: DC

## 2022-08-04 ENCOUNTER — Encounter: Payer: Self-pay | Admitting: Internal Medicine

## 2022-08-08 ENCOUNTER — Telehealth: Payer: Self-pay | Admitting: Internal Medicine

## 2022-08-08 NOTE — Telephone Encounter (Signed)
Patient's pharmacy wants to verify the dosage on the Annuity Elipta - it said one puff morning and night and it is usually once a day.  Please call pharmacy:  [phone]

## 2022-08-08 NOTE — Telephone Encounter (Signed)
Dosing on sig is correct

## 2022-08-25 DIAGNOSIS — L603 Nail dystrophy: Secondary | ICD-10-CM | POA: Diagnosis not present

## 2022-08-25 DIAGNOSIS — L821 Other seborrheic keratosis: Secondary | ICD-10-CM | POA: Diagnosis not present

## 2022-08-25 DIAGNOSIS — D224 Melanocytic nevi of scalp and neck: Secondary | ICD-10-CM | POA: Diagnosis not present

## 2022-08-25 DIAGNOSIS — L570 Actinic keratosis: Secondary | ICD-10-CM | POA: Diagnosis not present

## 2022-08-25 DIAGNOSIS — L720 Epidermal cyst: Secondary | ICD-10-CM | POA: Diagnosis not present

## 2022-08-27 DIAGNOSIS — H5203 Hypermetropia, bilateral: Secondary | ICD-10-CM | POA: Diagnosis not present

## 2022-08-27 DIAGNOSIS — H2513 Age-related nuclear cataract, bilateral: Secondary | ICD-10-CM | POA: Diagnosis not present

## 2022-09-16 ENCOUNTER — Ambulatory Visit (INDEPENDENT_AMBULATORY_CARE_PROVIDER_SITE_OTHER): Payer: Medicare PPO

## 2022-09-16 VITALS — Ht 66.0 in | Wt 154.0 lb

## 2022-09-16 DIAGNOSIS — Z0000 Encounter for general adult medical examination without abnormal findings: Secondary | ICD-10-CM

## 2022-09-16 NOTE — Progress Notes (Signed)
Subjective:   [name] is a 82 y.o. female who presents for Medicare Annual (Subsequent) preventive examination.  Visit Complete: Virtual  I connected with  [name] on 09/16/22 by a audio enabled telemedicine application and verified that I am speaking with the correct person using two identifiers.  Patient Location: Home  Provider Location: Office/Clinic  I discussed the limitations of evaluation and management by telemedicine. The patient expressed understanding and agreed to proceed.  Patient Medicare AWV questionnaire was completed by the patient on 09/15/2022; I have confirmed that all information answered by patient is correct and no changes since this date.  Review of Systems     Cardiac Risk Factors include: advanced age (>69men, >38 women);family history of premature cardiovascular disease;hypertension;dyslipidemia     Objective:    Today's Vitals   09/16/22 1503 09/16/22 1504  Weight: 154 lb (69.9 kg)   Height: 5\' 6"  (1.676 m)   PainSc: 0-No pain 0-No pain   Body mass index is 24.86 kg/m.     09/16/2022    3:05 PM 07/21/2022    7:37 PM 10/04/2021    2:04 PM 09/21/2020    4:21 PM 03/29/2018   11:30 AM 03/26/2018    1:57 PM 10/01/2017    3:34 PM  Advanced Directives  Does Patient Have a Medical Advance Directive? Yes Yes Yes Yes Yes Yes Yes  Type of Estate agent of [place];Living will Healthcare Power of [place];Living will Living will;Healthcare Power of Attorney Living will;Healthcare Power of [employer] Power of [place];Living will Healthcare Power of [place];Living will Living will  Does patient want to make changes to medical advance directive?   No - Patient declined No - Patient declined No - Patient declined No - Patient declined   Copy of Healthcare Power of Attorney in Chart? No - copy requested  No - copy requested No - copy requested No - copy requested No - copy requested   Would patient like information on  creating a medical advance directive?       No - Patient declined    Current Medications (verified) Outpatient Encounter Medications as of 09/16/2022  Medication Sig   albuterol (PROAIR HFA) 108 (90 Base) MCG/ACT inhaler INHALE 1-2 PUFFS INTO THE LUNGS EVERY 4 (FOUR) HOURS AS NEEDED FOR WHEEZING OR SHORTNESS OF BREATH.   amLODipine (NORVASC) 5 MG tablet Take 1 tablet (5 mg total) by mouth daily.   cholecalciferol (VITAMIN D) 1000 units tablet Take 1,000 Units by mouth daily.    ciprofloxacin (CIPRO) 500 MG tablet Take 1 tablet (500 mg total) by mouth 2 (two) times daily.   famotidine (PEPCID) 20 MG tablet Take 20 mg by mouth 2 (two) times daily.   HYDROcodone bit-homatropine (HYDROMET) 5-1.5 MG/5ML syrup Take 5 mLs by mouth every 6 (six) hours as needed for cough.   levothyroxine (SYNTHROID) 50 MCG tablet Take 1 tablet (50 mcg total) by mouth daily before breakfast.   phenazopyridine (PYRIDIUM) 100 MG tablet Take 100 mg by mouth as needed.   pravastatin (PRAVACHOL) 20 MG tablet Take 1 tablet (20 mg total) by mouth daily.   [DISCONTINUED] benzonatate (TESSALON) 100 MG capsule Take 1 capsule (100 mg total) by mouth 3 (three) times daily as needed for up to 15 doses for cough.   [DISCONTINUED] fluticasone (FLONASE) 50 MCG/ACT nasal spray Place 2 sprays into both nostrils daily.   [DISCONTINUED] Fluticasone Furoate (ARNUITY ELLIPTA) 200 MCG/ACT AEPB Inhale 200 mcg into the lungs in the morning  and at bedtime.   No facility-administered encounter medications on file as of 09/16/2022.    Allergies (verified) Meperidine hcl, Nitrofurantoin, Griseofulvin, and Penicillins   History: Past Medical History:  Diagnosis Date   Chronic hepatitis C (HCC) 2014   DID HARVONI AND RIBIVORIN TX    Headache    MIGRAINE AURA NO HEADACHE   History of blood transfusion 1990   AFTER GI BLEED   Hyperlipidemia    Hypertension    Multinodular goiter    SHRANK AWAY 25 YRS AGO   Osteoarthritis    OA    Palpitations    PACs & PVCs   Pneumonia YRS AGO   Past Surgical History:  Procedure Laterality Date   TOTAL KNEE ARTHROPLASTY Left 04/28/2016   Procedure: LEFT TOTAL KNEE ARTHROPLASTY;  Surgeon: Ollen Gross, MD;  Location: [facility];  Service: Orthopedics;  Laterality: Left;   TOTAL KNEE ARTHROPLASTY Right 03/29/2018   Procedure: RIGHT TOTAL KNEE ARTHROPLASTY;  Surgeon: Ollen Gross, MD;  Location: [facility];  Service: Orthopedics;  Laterality: Right;    TUBAL LIGATION     Family History  Problem Relation Age of Onset   Heart disease Mother    Heart disease Father    COPD Father    Social History   Socioeconomic History   Marital status: Married    Spouse name: Not on file   Number of children: Not on file   Years of education: Not on file   Highest education level: Doctorate  Occupational History   Not on file  Tobacco Use   Smoking status: Never   Smokeless tobacco: Never  Vaping Use   Vaping Use: Never used  Substance and Sexual Activity   Alcohol use: Yes    Comment: 1 GLASS WINE PER DAY   Drug use: No   Sexual activity: Not on file  Other Topics Concern   Not on file  Social History Narrative   Not on file   Social Determinants of Health   Financial Resource Strain: Low Risk  (09/16/2022)   Overall Financial Resource Strain (CARDIA)    Difficulty of Paying Living Expenses: Not hard at all  Food Insecurity: No Food Insecurity (09/16/2022)   Hunger Vital Sign    Worried About Running Out of Food in the Last Year: Never true    Ran Out of Food in the Last Year: Never true  Transportation Needs: No Transportation Needs (09/16/2022)   PRAPARE - Administrator, Civil Service (Medical): No    Lack of Transportation (Non-Medical): No  Physical Activity: Sufficiently Active (09/16/2022)   Exercise Vital Sign    Days of Exercise per Week: 7 days    Minutes of Exercise per Session: 100 min  Stress: No Stress Concern Present (09/16/2022)   [employer] of Occupational Health - Occupational Stress Questionnaire    Feeling of Stress : Not at all  Social Connections: Socially Integrated (09/16/2022)   Social Connection and Isolation Panel [NHANES]    Frequency of Communication with Friends and Family: Three times a week    Frequency of Social Gatherings with Friends and Family: Once a week    Attends Religious Services: 1 to 4 times per year    Active Member of [employer] or Organizations: Yes    Attends Engineer, structural: More than 4 times per year    Marital Status: Married    Tobacco Counseling Counseling given: Not Answered   Clinical Intake:  Pre-visit preparation completed:  Yes  Pain : No/denies pain Pain Score: 0-No pain     BMI - recorded: 24.86 Nutritional Status: BMI of 19-24  Normal Nutritional Risks: None Diabetes: No  How often do you need to have someone help you when you read instructions, pamphlets, or other written materials from your doctor or pharmacy?: 1 - Never What is the last grade level you completed in school?: PH.D  Interpreter Needed?: No  Information entered by :: Teryn Gust N. Rudolf Blizard, LPN.   Activities of Daily Living    09/16/2022    3:16 PM 09/15/2022   11:14 AM  In your present state of health, do you have any difficulty performing the following activities:  Hearing? 0 0  Vision? 0 0  Difficulty concentrating or making decisions? 0 0  Walking or climbing stairs? 0 0  Dressing or bathing? 0 0  Doing errands, shopping? 0 0  Preparing Food and eating ? N N  Using the Toilet? N N  In the past six months, have you accidently leaked urine? Y Y  Do you have problems with loss of bowel control? N N  Managing your Medications? N N  Managing your Finances? N N  Housekeeping or managing your Housekeeping? N N    Patient Care Team: Myrlene Broker, MD as PCP - General (Internal Medicine) Corky Crafts, MD as PCP - Cardiology (Cardiology) Antony Contras, MD as  Consulting Physician (Ophthalmology) Mindi Slicker, DMD as Consulting Physician (Dentistry)  Indicate any recent Medical Services you may have received from other than [facility] providers in the past year (date may be approximate).     Assessment:   This is a routine wellness examination for [name].  Hearing/Vision screen Hearing Screening - Comments:: Denies hearing difficulties.   Vision Screening - Comments:: Wears rx glasses - up to date with routine eye exams with Antony Contras, MD.   Dietary issues and exercise activities discussed:     Goals Addressed             This Visit's Progress    My healthcare goal for 2024 is to stay  healthy.        Depression Screen    09/16/2022    3:07 PM 11/06/2021    9:02 AM 10/04/2021    2:08 PM 09/21/2020    4:15 PM 09/21/2019   10:24 AM 09/20/2018    2:41 PM 09/17/2017    3:37 PM  PHQ 2/9 Scores  PHQ - 2 Score 0 0 0 0 0 0 0  PHQ- 9 Score 0 0         Fall Risk    09/16/2022    3:16 PM 09/15/2022   11:14 AM 07/25/2022    3:21 PM 11/06/2021    9:02 AM 10/04/2021    2:04 PM  Fall Risk   Falls in the past year? 1 1 0 0 0  Number falls in past yr: 0 0 0 0 0  Injury with Fall? 0 0 0 0 0  Risk for fall due to : No Fall Risks   No Fall Risks No Fall Risks  Follow up Falls prevention discussed;Education provided;Falls evaluation completed  Falls evaluation completed Falls evaluation completed Falls evaluation completed    MEDICARE RISK AT HOME:  Medicare Risk at Home - 09/16/22 1517     Any stairs in or around the home? Yes    If so, are there any without handrails? No    Home free of loose throw rugs  in walkways, pet beds, electrical cords, etc? Yes    Adequate lighting in your home to reduce risk of falls? Yes    Life alert? No    Use of a cane, walker or w/c? No    Grab bars in the bathroom? Yes    Shower chair or bench in shower? No    Elevated toilet seat or a handicapped toilet? No             TIMED UP AND GO:  Was  the test performed?  No    Cognitive Function:        09/16/2022    3:17 PM 10/04/2021    2:13 PM  6CIT Screen  What Year? 0 points 0 points  What month? 0 points 0 points  What time? 0 points 0 points  Count back from 20 0 points 0 points  Months in reverse 0 points 0 points  Repeat phrase 0 points 0 points  Total Score 0 points 0 points    Immunizations Immunization History  Administered Date(s) Administered   Fluad Quad(high Dose 65+) 12/31/2018, 01/21/2020, 12/28/2020   H1N1 03/03/2008   Hepatitis A, Ped/Adol-2 Dose 05/26/2011   Hepatitis B, PED/ADOLESCENT 05/26/2011   Influenza Split 01/16/2005, 01/08/2009, 01/28/2011, 12/31/2012, 01/23/2016   Influenza,inj,Quad PF,6+ Mos 12/16/2016   Influenza,inj,quad, With Preservative 12/20/2013, 02/06/2015   Influenza-Unspecified 01/20/2018   PFIZER Comirnaty(Gray Top)Covid-19 Tri-Sucrose Vaccine 12/12/2021   PFIZER(Purple Top)SARS-COV-2 Vaccination 04/06/2019, 04/26/2019, 12/20/2019, 06/21/2020, 12/04/2020   PPD Test 12/06/2004   Pfizer Covid-19 Vaccine Bivalent Booster 52yrs & up 08/27/2021   Pneumococcal Conjugate-13 03/24/2013, 12/20/2013   Pneumococcal Polysaccharide-23 03/25/2007, 03/03/2008, 09/21/2020   Td 11/08/1999   Tdap 03/24/2009, 05/25/2009, 02/07/2022   Zoster Recombinat (Shingrix) 07/23/2017, 09/23/2017   Zoster, Live 03/24/2010, 11/20/2010   Zoster, Unspecified 10/21/2017, 05/04/2018    TDAP status: Up to date  Flu Vaccine status: Up to date  Pneumococcal vaccine status: Up to date  Covid-19 vaccine status: Completed vaccines  Qualifies for Shingles Vaccine? Yes   Zostavax completed Yes   Shingrix Completed?: Yes  Screening Tests Health Maintenance  Topic Date Due   COVID-19 Vaccine (8 - 2023-24 season) 02/06/2022   DEXA SCAN  09/16/2023 (Originally 12/26/2007)   INFLUENZA VACCINE  10/23/2022   Medicare Annual Wellness (AWV)  09/16/2023   DTaP/Tdap/Td (5 - Td or Tdap) 02/08/2032   Pneumonia  Vaccine 58+ Years old  Completed   Hepatitis C Screening  Completed   Zoster Vaccines- Shingrix  Completed   HPV VACCINES  Aged Out    Health Maintenance  Health Maintenance Due  Topic Date Due   COVID-19 Vaccine (8 - 2023-24 season) 02/06/2022    Colorectal cancer screening: No longer required.   Mammogram status: No longer required due to age/patient declined.  Bone Density status: Patient declined.  Lung Cancer Screening: (Low Dose CT Chest recommended if Age 84-80 years, 20 pack-year currently smoking OR have quit w/in 15years.) does not qualify.   Lung Cancer Screening Referral: no  Additional Screening:  Hepatitis C Screening: does qualify; Completed 09/20/2018  Vision Screening: Recommended annual ophthalmology exams for early detection of glaucoma and other disorders of the eye. Is the patient up to date with their annual eye exam?  Yes  Who is the provider or what is the name of the office in which the patient attends annual eye exams? Antony Contras, MD. If pt is not established with a provider, would they like to be referred to a provider to establish care?  No .   Dental Screening: Recommended annual dental exams for proper oral hygiene  Diabetic Foot Exam: N/A  Community Resource Referral / Chronic Care Management: CRR required this visit?  No   CCM required this visit?  No     Plan:     I have personally reviewed and noted the following in the patient's chart:   Medical and social history Use of alcohol, tobacco or illicit drugs  Current medications and supplements including opioid prescriptions. Patient is not currently taking opioid prescriptions. Functional ability and status Nutritional status Physical activity Advanced directives List of other physicians Hospitalizations, surgeries, and ER visits in previous 12 months Vitals Screenings to include cognitive, depression, and falls Referrals and appointments  In addition, I have reviewed and  discussed with patient certain preventive protocols, quality metrics, and best practice recommendations. A written personalized care plan for preventive services as well as general preventive health recommendations were provided to patient.     Mickeal Needy, LPN   1/61/0960   After Visit Summary: (Mail) Due to this being a telephonic visit, the after visit summary with patients personalized plan was offered to patient via mail   Nurse Notes: Normal cognitive status assessed by direct observation via telephone conversation by this Nurse Health Advisor. No abnormalities found.

## 2022-09-16 NOTE — Patient Instructions (Addendum)
[name] , Thank you for taking time to come for your Medicare Wellness Visit. I appreciate your ongoing commitment to your health goals. Please review the following plan we discussed and let me know if I can assist you in the future.   These are the goals we discussed:  Goals      My healthcare goal for 2024 is to stay  healthy.        This is a list of the screening recommended for you and due dates:  Health Maintenance  Topic Date Due   COVID-19 Vaccine (8 - 2023-24 season) 02/06/2022   DEXA scan (bone density measurement)  09/16/2023*   Flu Shot  10/23/2022   Medicare Annual Wellness Visit  09/16/2023   DTaP/Tdap/Td vaccine (5 - Td or Tdap) 02/08/2032   Pneumonia Vaccine  Completed   Hepatitis C Screening  Completed   Zoster (Shingles) Vaccine  Completed   HPV Vaccine  Aged Out  *Topic was postponed. The date shown is not the original due date.    Advanced directives: Yes  Conditions/risks identified: Yes  Next appointment: It was nice speaking with you today!  Please follow up in one year for your annual wellness visit via telephone call with Nurse Percell Miller on 09/22/2023 at 4:00 p.m.  If you need to cancel or reschedule please call [phone].   Preventive Care 80 Years and Older, Female Preventive care refers to lifestyle choices and visits with your health care provider that can promote health and wellness. What does preventive care include? A yearly physical exam. This is also called an annual well check. Dental exams once or twice a year. Routine eye exams. Ask your health care provider how often you should have your eyes checked. Personal lifestyle choices, including: Daily care of your teeth and gums. Regular physical activity. Eating a healthy diet. Avoiding tobacco and drug use. Limiting alcohol use. Practicing safe sex. Taking low-dose aspirin every day. Taking vitamin and mineral supplements as recommended by your health care provider. What happens  during an annual well check? The services and screenings done by your health care provider during your annual well check will depend on your age, overall health, lifestyle risk factors, and family history of disease. Counseling  Your health care provider may ask you questions about your: Alcohol use. Tobacco use. Drug use. Emotional well-being. Home and relationship well-being. Sexual activity. Eating habits. History of falls. Memory and ability to understand (cognition). Work and work Astronomer. Reproductive health. Screening  You may have the following tests or measurements: Height, weight, and BMI. Blood pressure. Lipid and cholesterol levels. These may be checked every 5 years, or more frequently if you are over 91 years old. Skin check. Lung cancer screening. You may have this screening every year starting at age 39 if you have a 30-pack-year history of smoking and currently smoke or have quit within the past 15 years. Fecal occult blood test (FOBT) of the stool. You may have this test every year starting at age 21. Flexible sigmoidoscopy or colonoscopy. You may have a sigmoidoscopy every 5 years or a colonoscopy every 10 years starting at age 62. Hepatitis C blood test. Hepatitis B blood test. Sexually transmitted disease (STD) testing. Diabetes screening. This is done by checking your blood sugar (glucose) after you have not eaten for a while (fasting). You may have this done every 1-3 years. Bone density scan. This is done to screen for osteoporosis. You may have this done starting at age 62.  Mammogram. This may be done every 1-2 years. Talk to your health care provider about how often you should have regular mammograms. Talk with your health care provider about your test results, treatment options, and if necessary, the need for more tests. Vaccines  Your health care provider may recommend certain vaccines, such as: Influenza vaccine. This is recommended every  year. Tetanus, diphtheria, and acellular pertussis (Tdap, Td) vaccine. You may need a Td booster every 10 years. Zoster vaccine. You may need this after age 33. Pneumococcal 13-valent conjugate (PCV13) vaccine. One dose is recommended after age 4. Pneumococcal polysaccharide (PPSV23) vaccine. One dose is recommended after age 77. Talk to your health care provider about which screenings and vaccines you need and how often you need them. This information is not intended to replace advice given to you by your health care provider. Make sure you discuss any questions you have with your health care provider. Document Released: 04/06/2015 Document Revised: 11/28/2015 Document Reviewed: 01/09/2015 Elsevier Interactive Patient Education  2017 [employer] Prevention in the Home Falls can cause injuries. They can happen to people of all ages. There are many things you can do to make your home safe and to help prevent falls. What can I do on the outside of my home? Regularly fix the edges of walkways and driveways and fix any cracks. Remove anything that might make you trip as you walk through a door, such as a raised step or threshold. Trim any bushes or trees on the path to your home. Use bright outdoor lighting. Clear any walking paths of anything that might make someone trip, such as rocks or tools. Regularly check to see if handrails are loose or broken. Make sure that both sides of any steps have handrails. Any raised decks and porches should have guardrails on the edges. Have any leaves, snow, or ice cleared regularly. Use sand or salt on walking paths during winter. Clean up any spills in your garage right away. This includes oil or grease spills. What can I do in the bathroom? Use night lights. Install grab bars by the toilet and in the tub and shower. Do not use towel bars as grab bars. Use non-skid mats or decals in the tub or shower. If you need to sit down in the shower, use a  plastic, non-slip stool. Keep the floor dry. Clean up any water that spills on the floor as soon as it happens. Remove soap buildup in the tub or shower regularly. Attach bath mats securely with double-sided non-slip rug tape. Do not have throw rugs and other things on the floor that can make you trip. What can I do in the bedroom? Use night lights. Make sure that you have a light by your bed that is easy to reach. Do not use any sheets or blankets that are too big for your bed. They should not hang down onto the floor. Have a firm chair that has side arms. You can use this for support while you get dressed. Do not have throw rugs and other things on the floor that can make you trip. What can I do in the kitchen? Clean up any spills right away. Avoid walking on wet floors. Keep items that you use a lot in easy-to-reach places. If you need to reach something above you, use a strong step stool that has a grab bar. Keep electrical cords out of the way. Do not use floor polish or wax that makes floors slippery. If  you must use wax, use non-skid floor wax. Do not have throw rugs and other things on the floor that can make you trip. What can I do with my stairs? Do not leave any items on the stairs. Make sure that there are handrails on both sides of the stairs and use them. Fix handrails that are broken or loose. Make sure that handrails are as long as the stairways. Check any carpeting to make sure that it is firmly attached to the stairs. Fix any carpet that is loose or worn. Avoid having throw rugs at the top or bottom of the stairs. If you do have throw rugs, attach them to the floor with carpet tape. Make sure that you have a light switch at the top of the stairs and the bottom of the stairs. If you do not have them, ask someone to add them for you. What else can I do to help prevent falls? Wear shoes that: Do not have high heels. Have rubber bottoms. Are comfortable and fit you  well. Are closed at the toe. Do not wear sandals. If you use a stepladder: Make sure that it is fully opened. Do not climb a closed stepladder. Make sure that both sides of the stepladder are locked into place. Ask someone to hold it for you, if possible. Clearly mark and make sure that you can see: Any grab bars or handrails. First and last steps. Where the edge of each step is. Use tools that help you move around (mobility aids) if they are needed. These include: Canes. Walkers. Scooters. Crutches. Turn on the lights when you go into a dark area. Replace any light bulbs as soon as they burn out. Set up your furniture so you have a clear path. Avoid moving your furniture around. If any of your floors are uneven, fix them. If there are any pets around you, be aware of where they are. Review your medicines with your doctor. Some medicines can make you feel dizzy. This can increase your chance of falling. Ask your doctor what other things that you can do to help prevent falls. This information is not intended to replace advice given to you by your health care provider. Make sure you discuss any questions you have with your health care provider. Document Released: 01/04/2009 Document Revised: 08/16/2015 Document Reviewed: 04/14/2014 Elsevier Interactive Patient Education  2017 [employer].

## 2022-11-25 ENCOUNTER — Encounter: Payer: Self-pay | Admitting: Internal Medicine

## 2022-11-25 MED ORDER — CIPROFLOXACIN HCL 500 MG PO TABS: 500.0000 mg | ORAL_TABLET | Freq: Two times a day (BID) | ORAL | 2 refills | Status: AC

## 2022-11-25 NOTE — Telephone Encounter (Signed)
Please advise 

## 2022-12-03 ENCOUNTER — Encounter: Payer: Self-pay | Admitting: Internal Medicine

## 2022-12-03 ENCOUNTER — Ambulatory Visit: Payer: Medicare PPO | Admitting: Internal Medicine

## 2022-12-03 VITALS — BP 116/80 | HR 64 | Temp 97.9°F | Ht 66.0 in | Wt 155.0 lb

## 2022-12-03 DIAGNOSIS — N390 Urinary tract infection, site not specified: Secondary | ICD-10-CM | POA: Diagnosis not present

## 2022-12-03 DIAGNOSIS — E042 Nontoxic multinodular goiter: Secondary | ICD-10-CM

## 2022-12-03 DIAGNOSIS — I10 Essential (primary) hypertension: Secondary | ICD-10-CM

## 2022-12-03 DIAGNOSIS — E782 Mixed hyperlipidemia: Secondary | ICD-10-CM | POA: Diagnosis not present

## 2022-12-03 DIAGNOSIS — Z0000 Encounter for general adult medical examination without abnormal findings: Secondary | ICD-10-CM | POA: Diagnosis not present

## 2022-12-03 LAB — COMPREHENSIVE METABOLIC PANEL
ALT: 13 U/L (ref 0–35)
AST: 18 U/L (ref 0–37)
Albumin: 4.2 g/dL (ref 3.5–5.2)
Alkaline Phosphatase: 80 U/L (ref 39–117)
BUN: 17 mg/dL (ref 6–23)
CO2: 26 meq/L (ref 19–32)
Calcium: 9.5 mg/dL (ref 8.4–10.5)
Chloride: 104 meq/L (ref 96–112)
Creatinine, Ser: 0.69 mg/dL (ref 0.40–1.20)
GFR: 82.24 mL/min (ref >60.00)
Glucose, Bld: 82 mg/dL (ref 70–99)
Potassium: 4.2 meq/L (ref 3.5–5.1)
Sodium: 139 meq/L (ref 135–145)
Total Bilirubin: 0.7 mg/dL (ref 0.2–1.2)
Total Protein: 6.7 g/dL (ref 6.0–8.3)

## 2022-12-03 LAB — LIPID PANEL
Cholesterol: 173 mg/dL (ref 0–200)
HDL: 63 mg/dL (ref >39.00)
LDL Cholesterol: 97 mg/dL (ref 0–99)
NonHDL: 110.45
Total CHOL/HDL Ratio: 3
Triglycerides: 67 mg/dL (ref 0.0–149.0)
VLDL: 13.4 mg/dL (ref 0.0–40.0)

## 2022-12-03 LAB — CBC
HCT: 40.6 % (ref 36.0–46.0)
Hemoglobin: 13.1 g/dL (ref 12.0–15.0)
MCHC: 32.4 g/dL (ref 30.0–36.0)
MCV: 90.4 fl (ref 78.0–100.0)
Platelets: 177 10*3/uL (ref 150.0–400.0)
RBC: 4.5 Mil/uL (ref 3.87–5.11)
RDW: 13.4 % (ref 11.5–15.5)
WBC: 4.7 10*3/uL (ref 4.0–10.5)

## 2022-12-03 LAB — TSH: TSH: 1.95 u[IU]/mL (ref 0.35–5.50)

## 2022-12-03 LAB — FERRITIN: Ferritin: 63.6 ng/mL (ref 10.0–291.0)

## 2022-12-03 MED ORDER — PRAVASTATIN SODIUM 20 MG PO TABS: 20.0000 mg | ORAL_TABLET | Freq: Every day | ORAL | 3 refills | Status: DC

## 2022-12-03 MED ORDER — LEVOTHYROXINE SODIUM 50 MCG PO TABS: 50.0000 ug | ORAL_TABLET | Freq: Every day | ORAL | 3 refills | Status: DC

## 2022-12-03 MED ORDER — AMLODIPINE BESYLATE 5 MG PO TABS: 5.0000 mg | ORAL_TABLET | Freq: Every day | ORAL | 3 refills | Status: DC

## 2022-12-03 NOTE — Progress Notes (Signed)
   Subjective:   Patient ID: [name], female    DOB: [birth year], 82 y.o.   MRN: [id number]  HPI The patient is here for physical.  PMH, [facility], social history reviewed and updated  Review of Systems  Constitutional: Negative.   HENT: Negative.    Eyes: Negative.   Respiratory:  Negative for cough, chest tightness and shortness of breath.   Cardiovascular:  Negative for chest pain, palpitations and leg swelling.  Gastrointestinal:  Negative for abdominal distention, abdominal pain, constipation, diarrhea, nausea and vomiting.  Musculoskeletal: Negative.   Skin: Negative.   Neurological: Negative.   Psychiatric/Behavioral: Negative.      Objective:  Physical Exam Constitutional:      Appearance: She is well-developed.  HENT:     Head: Normocephalic and atraumatic.  Cardiovascular:     Rate and Rhythm: Normal rate and regular rhythm.  Pulmonary:     Effort: Pulmonary effort is normal. No respiratory distress.     Breath sounds: Normal breath sounds. No wheezing or rales.  Abdominal:     General: Bowel sounds are normal. There is no distension.     Palpations: Abdomen is soft.     Tenderness: There is no abdominal tenderness. There is no rebound.  Musculoskeletal:     Cervical back: Normal range of motion.  Skin:    General: Skin is warm and dry.  Neurological:     Mental Status: She is alert and oriented to person, place, and time.     Coordination: Coordination normal.     Vitals:   12/03/22 1047  BP: 116/80  Pulse: 64  Temp: 97.9 F (36.6 C)  TempSrc: Oral  SpO2: 97%  Weight: 155 lb (70.3 kg)  Height: 5\' 6"  (1.676 m)    Assessment & Plan:

## 2022-12-03 NOTE — Assessment & Plan Note (Signed)
Taking synthroid 50 mcg daily and checking TSH. Adjust as needed.

## 2022-12-03 NOTE — Assessment & Plan Note (Signed)
Flu shot yearly. Pneumonia complete. Shingrix complete. Tetanus up to date. Colonoscopy aged out. Mammogram aged out, pap smear aged out and dexa complete. Counseled about sun safety and mole surveillance. Counseled about the dangers of distracted driving. Given 10 year screening recommendations.

## 2022-12-03 NOTE — Assessment & Plan Note (Signed)
Checking lipid panel and adjust as needed. Taking pravastatin 20 mg daily.

## 2022-12-03 NOTE — Assessment & Plan Note (Signed)
Keeps cipro on hand in case of infection. No refill needed today.

## 2022-12-03 NOTE — Assessment & Plan Note (Signed)
BP at goal on amlodipine 5 mg daily and checking CMP and adjust as needed.

## 2023-04-28 ENCOUNTER — Ambulatory Visit: Payer: Medicare PPO | Admitting: Cardiology

## 2023-05-25 ENCOUNTER — Ambulatory Visit: Payer: Medicare PPO | Admitting: Cardiology

## 2023-06-09 ENCOUNTER — Encounter: Payer: Self-pay | Admitting: Cardiology

## 2023-06-09 ENCOUNTER — Ambulatory Visit: Payer: Medicare PPO | Attending: Cardiology | Admitting: Cardiology

## 2023-06-09 ENCOUNTER — Ambulatory Visit (INDEPENDENT_AMBULATORY_CARE_PROVIDER_SITE_OTHER)

## 2023-06-09 VITALS — BP 116/84 | HR 65 | Resp 16 | Ht 66.0 in | Wt 156.2 lb

## 2023-06-09 DIAGNOSIS — I493 Ventricular premature depolarization: Secondary | ICD-10-CM

## 2023-06-09 DIAGNOSIS — I491 Atrial premature depolarization: Secondary | ICD-10-CM

## 2023-06-09 DIAGNOSIS — I10 Essential (primary) hypertension: Secondary | ICD-10-CM | POA: Diagnosis not present

## 2023-06-09 DIAGNOSIS — I447 Left bundle-branch block, unspecified: Secondary | ICD-10-CM

## 2023-06-09 NOTE — Progress Notes (Unsigned)
 Enrolled patient for a 3 day Zio XT monitor to be mailed to patients home

## 2023-06-09 NOTE — Patient Instructions (Signed)
 Medication Instructions:  Your physician recommends that you continue on your current medications as directed. Please refer to the Current Medication list given to you today.  *If you need a refill on your cardiac medications before your next appointment, please call your pharmacy*  Lab Work: None ordered today. If you have labs (blood work) drawn today and your tests are completely normal, you will receive your results only by: MyChart Message (if you have MyChart) OR A paper copy in the mail If you have any lab test that is abnormal or we need to change your treatment, we will call you to review the results.  Testing/Procedures: Your physician has requested that you have an echocardiogram. Echocardiography is a painless test that uses sound waves to create images of your heart. It provides your doctor with information about the size and shape of your heart and how well your heart's chambers and valves are working. This procedure takes approximately one hour. There are no restrictions for this procedure. Please do NOT wear cologne, perfume, aftershave, or lotions (deodorant is allowed). Please arrive 15 minutes prior to your appointment time.  Please note: We ask at that you not bring children with you during ultrasound (echo/ vascular) testing. Due to room size and safety concerns, children are not allowed in the ultrasound rooms during exams. Our front office staff cannot provide observation of children in our lobby area while testing is being conducted. An adult accompanying a patient to their appointment will only be allowed in the ultrasound room at the discretion of the ultrasound technician under special circumstances. We apologize for any inconvenience.   Your physician has requested that you wear a Zio heart monitor for 3 days. This will be mailed to your home with instructions on how to apply the monitor and how to return it when finished. Please allow 2 weeks after returning the heart  monitor before our office calls you with the results.   Follow-Up: At [facility], you and your health needs are our priority.  As part of our continuing mission to provide you with exceptional heart care, we have created designated Provider Care Teams.  These Care Teams include your primary Cardiologist (physician) and Advanced Practice Providers (APPs -  Physician Assistants and Nurse Practitioners) who all work together to provide you with the care you need, when you need it.   Your next appointment:   1 year(s)  The format for your next appointment:   In Person  Provider:   Tessa Lerner, DO {  Other Instructions ZIO XT- Long Term Monitor Instructions     Your physician has requested you wear a ZIO patch monitor for 3 days.  This is a single patch monitor. [employer] supplies one patch monitor per enrollment. Additional  stickers are not available. Please do not apply patch if you will be having a Nuclear Stress Test,  Echocardiogram, Cardiac CT, MRI, or Chest Xray during the period you would be wearing the  monitor. The patch cannot be worn during these tests. You cannot remove and re-apply the  ZIO XT patch monitor.  Your ZIO patch monitor will be mailed 3 day USPS to your address on file. It may take 3-5 days  to receive your monitor after you have been enrolled.  Once you have received your monitor, please review the enclosed instructions. Your monitor  has already been registered assigning a specific monitor serial # to you.     Billing and Patient Assistance Program Information  We have supplied [employer] with any of your insurance information on file for billing purposes.  [employer] offers a sliding scale Patient Assistance Program for patients that do not have  insurance, or whose insurance does not completely cover the cost of the ZIO monitor.  You must apply for the Patient Assistance Program to qualify for this discounted rate.  To apply, please call [employer] at  [phone], select option 4, select option 2, ask to apply for  Patient Assistance Program. Meredeth Ide will ask your household income, and how many people  are in your household. They will quote your out-of-pocket cost based on that information.  [employer] will also be able to set up a 7-month, interest-free payment plan if needed.     Applying the monitor     Shave hair from upper left chest.  Hold abrader disc by orange tab. Rub abrader in 40 strokes over the upper left chest as  indicated in your monitor instructions.  Clean area with 4 enclosed alcohol pads. Let dry.  Apply patch as indicated in monitor instructions. Patch will be placed under collarbone on left  side of chest with arrow pointing upward.  Rub patch adhesive wings for 2 minutes. Remove white label marked "1". Remove the white  label marked "2". Rub patch adhesive wings for 2 additional minutes.  While looking in a mirror, press and release button in center of patch. A small green light will  flash 3-4 times. This will be your only indicator that the monitor has been turned on.  Do not shower for the first 24 hours. You may shower after the first 24 hours.  Press the button if you feel a symptom. You will hear a small click. Record Date, Time and  Symptom in the Patient Logbook.  When you are ready to remove the patch, follow instructions on the last 2 pages of Patient  Logbook. Stick patch monitor onto the last page of Patient Logbook.  Place Patient Logbook in the blue and white box. Use locking tab on box and tape box closed  securely. The blue and white box has prepaid postage on it. Please place it in the mailbox as  soon as possible. Your physician should have your test results approximately 7 days after the  monitor has been mailed back to [facility].  Call [facility] Customer Care at [phone] if you have questions regarding  your ZIO XT patch monitor. Call them immediately if you see an orange light  blinking on your  monitor.  If your monitor falls off in less than 4 days, contact our Monitor department at [phone].  If your monitor becomes loose or falls off after 4 days call [employer] at [phone] for  suggestions on securing your monitor.

## 2023-06-09 NOTE — Progress Notes (Signed)
 Cardiology Office Note:  .   Date:  06/09/2023  ID:  [name], DOB 02/04/1941, MRN [id number] PCP:  Myrlene Broker, MD  Former Cardiology Providers: Dr. Lance Muss [facility] Providers Cardiologist:  Tessa Lerner, DO , [facility] (established care 06/09/23) Electrophysiologist:  None  Click to update primary MD,subspecialty MD or APP then REFRESH:1}    Chief Complaint  Patient presents with   eft bundle branch block   Follow-up    History of Present Illness: .   [name] is a 83 y.o. Caucasian female whose past medical history and cardiovascular risk factors includes: Left bundle branch block, chronic hepatitis C, hyperlipidemia, history of PAC/PVC, history of GI bleed in 1990 status post transfusion, family history of premature CAD (father had an MI in his 63s).  Formally under the care of Dr. Lance Muss who last saw [name] back in February 2024. I am seeing her for the first time to re-establishing care.   Patient was being seen by Dr. Lance Muss for left bundle branch block diagnosed in 2022 as well as palpitations due to PACs and PVCs.  She presents today for 1 year follow-up visit.  Patient presents today for 1 year follow-up visit.  She denies anginal chest pain or heart failure symptoms.  Her palpitations are still present but duration is quite short.  She does appreciate these episodes.  Patient states that she was on metoprolol in the past but due to bradycardia the medication was eventually held.  She has never had a formal cardiac monitor to evaluate for PAC/PVC burden.  And no formal echocardiogram to evaluate LVEF given her underlying left bundle branch block.  Overall functional capacity is excellent for age, she walks at least 3 to 5 miles per day.     Review of Systems: .   Review of Systems  Cardiovascular:  Positive for palpitations (rare). Negative for chest pain, claudication, irregular heartbeat, leg swelling,  near-syncope, orthopnea, paroxysmal nocturnal dyspnea and syncope.  Respiratory:  Negative for shortness of breath.   Hematologic/Lymphatic: Negative for bleeding problem.    Studies Reviewed:   EKG: EKG Interpretation Date/Time:  Tuesday June 09 2023 08:47:29 EDT Ventricular Rate:  65 PR Interval:  172 QRS Duration:  142 QT Interval:  464 QTC Calculation: 482 R Axis:   -58  Text Interpretation: Sinus rhythm with Premature atrial complexes in a pattern of bigeminy Left axis deviation Left bundle branch block When compared with ECG of 26-Mar-2018 14:29, Left bundle branch block is now Present Confirmed by Tessa Lerner [phone]) on 06/09/2023 9:03:55 AM  RADIOLOGY: NA  Risk Assessment/Calculations:   NA   Labs:       Latest Ref Rng & Units 12/03/2022   11:15 AM 07/21/2022    8:16 PM 11/06/2021    9:16 AM  CBC  WBC 4.0 - 10.5 K/uL 4.7  3.4  4.2   Hemoglobin 12.0 - 15.0 g/dL 87.5  64.3  32.9   Hematocrit 36.0 - 46.0 % 40.6  40.7  39.4   Platelets 150.0 - 400.0 K/uL 177.0  126  163.0        Latest Ref Rng & Units 12/03/2022   11:15 AM 07/21/2022    8:16 PM 11/06/2021    9:16 AM  BMP  Glucose 70 - 99 mg/dL 82  518  91   BUN 6 - 23 mg/dL 17  11  17    Creatinine 0.40 - 1.20 mg/dL 8.41  6.60  0.68   Sodium 135 - 145 mEq/L 139  137  141   Potassium 3.5 - 5.1 mEq/L 4.2  4.0  4.2   Chloride 96 - 112 mEq/L 104  104  105   CO2 19 - 32 mEq/L 26  24  27    Calcium 8.4 - 10.5 mg/dL 9.5  9.0  9.3       Latest Ref Rng & Units 12/03/2022   11:15 AM 07/21/2022    8:16 PM 11/06/2021    9:16 AM  CMP  Glucose 70 - 99 mg/dL 82  161  91   BUN 6 - 23 mg/dL 17  11  17    Creatinine 0.40 - 1.20 mg/dL 0.96  0.45  4.09   Sodium 135 - 145 mEq/L 139  137  141   Potassium 3.5 - 5.1 mEq/L 4.2  4.0  4.2   Chloride 96 - 112 mEq/L 104  104  105   CO2 19 - 32 mEq/L 26  24  27    Calcium 8.4 - 10.5 mg/dL 9.5  9.0  9.3   Total Protein 6.0 - 8.3 g/dL 6.7   6.6   Total Bilirubin 0.2 - 1.2 mg/dL 0.7   1.1    Alkaline Phos 39 - 117 U/L 80   76   AST 0 - 37 U/L 18   18   ALT 0 - 35 U/L 13   13     Lab Results  Component Value Date   CHOL 173 12/03/2022   HDL 63.00 12/03/2022   LDLCALC 97 12/03/2022   LDLDIRECT 153.4 04/07/2007   TRIG 67.0 12/03/2022   CHOLHDL 3 12/03/2022   No results for input(s): "LIPOA" in the last 8760 hours. No components found for: "NTPROBNP" No results for input(s): "PROBNP" in the last 8760 hours. Recent Labs    12/03/22 1115  TSH 1.95    Physical Exam:    Today's Vitals   06/09/23 0843  BP: 116/84  Pulse: 65  Resp: 16  SpO2: 96%  Weight: 156 lb 3.2 oz (70.9 kg)  Height: 5\' 6"  (1.676 m)   Body mass index is 25.21 kg/m. Wt Readings from Last 3 Encounters:  06/09/23 156 lb 3.2 oz (70.9 kg)  12/03/22 155 lb (70.3 kg)  09/16/22 154 lb (69.9 kg)    Physical Exam  Constitutional: No distress.  hemodynamically stable  Neck: No JVD present.  Cardiovascular: Normal rate, regular rhythm, S1 normal and S2 normal. Exam reveals no gallop, no S3 and no S4.  No murmur heard. Pulses:      Dorsalis pedis pulses are 2+ on the right side and 2+ on the left side.       Posterior tibial pulses are 2+ on the right side and 2+ on the left side.  Pulmonary/Chest: Effort normal and breath sounds normal. No stridor. She has no wheezes. She has no rales.  Musculoskeletal:        General: Edema (trace) present.     Cervical back: Neck supple.  Skin: Skin is warm.     Impression & Recommendation(s):  Impression:   ICD-10-CM   1. LBBB (left bundle branch block)  I44.7 EKG 12-Lead    ECHOCARDIOGRAM COMPLETE    2. Essential hypertension  I10     3. PAC (premature atrial contraction)  I49.1 LONG TERM MONITOR (3-14 DAYS)    4. PVC (premature ventricular contraction)  I49.3 LONG TERM MONITOR (3-14 DAYS)       Recommendation(s):  LBBB (  left bundle branch block) Chronic, since 2022 Clinically asymptomatic. Plan echo to evaluate for LVEF as there are no  prior studies. Further recommendations to follow Since her overall function capacity is excellent, no change in physical endurance, and the left bundle branch block is chronic will avoid stress testing at this time as she is asymptomatic.  Essential hypertension Office blood pressures are very well-controlled. Continue amlodipine 5 mg p.o. daily  PAC (premature atrial contraction) PVC (premature ventricular contraction) Chronic, but still present. Overall intensity frequency and duration has not changed much. No prior cardiac monitor to evaluate for PAC and PVC burden or other sustained arrhythmias. Shared decision was to proceed forward with a 3-day cardiac monitor to evaluate for dysrhythmias and ectopic burden. Was on metoprolol in the past but discontinued secondary to bradycardia. Further recommendations to follow  As long as the echocardiogram and the monitor results are favorable no additional testing is warranted at this time would like to follow-up in a year.  If she remains stable at 1 year follow-up consider follow-up as needed basis.   Orders Placed:  Orders Placed This Encounter  Procedures   LONG TERM MONITOR (3-14 DAYS)    Standing Status:   Future    Number of Occurrences:   1    Expected Date:   06/16/2023    Expiration Date:   06/08/2024    Where should this test be performed?:   [facility]    Does the patient have an implanted cardiac device?:   No    Prescribed days of wear:   3    Type of enrollment:   Home Enrollment    Vendor::   [employer]   EKG 12-Lead   ECHOCARDIOGRAM COMPLETE    Standing Status:   Future    Expected Date:   06/16/2023    Expiration Date:   06/08/2024    Where should this test be performed:   [facility])    Does the patient weigh less than or greater than 250 lbs?:   Patient weighs less than 250 lbs    Perflutren DEFINITY (image enhancing agent) should be administered unless hypersensitivity or allergy exist:    Administer Perflutren    Reason for exam-Echo:   Other-Full Diagnosis List    Full ICD-10/Reason for Exam:   LBBB (left bundle branch block) [id number]     Final Medication List:   No orders of the defined types were placed in this encounter.   There are no discontinued medications.   Current Outpatient Medications:    albuterol (PROAIR HFA) 108 (90 Base) MCG/ACT inhaler, INHALE 1-2 PUFFS INTO THE LUNGS EVERY 4 (FOUR) HOURS AS NEEDED FOR WHEEZING OR SHORTNESS OF BREATH., Disp: 8.5 g, Rfl: 6   amLODipine (NORVASC) 5 MG tablet, Take 1 tablet (5 mg total) by mouth daily., Disp: 90 tablet, Rfl: 3   cholecalciferol (VITAMIN D) 1000 units tablet, Take 1,000 Units by mouth daily. , Disp: , Rfl:    ciprofloxacin (CIPRO) 500 MG tablet, Take 1 tablet (500 mg total) by mouth 2 (two) times daily., Disp: 10 tablet, Rfl: 2   levothyroxine (SYNTHROID) 50 MCG tablet, Take 1 tablet (50 mcg total) by mouth daily before breakfast., Disp: 90 tablet, Rfl: 3   phenazopyridine (PYRIDIUM) 100 MG tablet, Take 100 mg by mouth as needed., Disp: , Rfl:    pravastatin (PRAVACHOL) 20 MG tablet, Take 1 tablet (20 mg total) by mouth daily., Disp: 90 tablet, Rfl: 3  Consent:  NA  Disposition:   1 year follow-up sooner if needed  Her questions and concerns were addressed to her satisfaction. She voices understanding of the recommendations provided during this encounter.    Signed, Tessa Lerner, DO, [facility]   [facility]  [address] [place], [place] [address] 06/09/2023 10:19 AM

## 2023-06-19 DIAGNOSIS — I493 Ventricular premature depolarization: Secondary | ICD-10-CM | POA: Diagnosis not present

## 2023-06-19 DIAGNOSIS — I491 Atrial premature depolarization: Secondary | ICD-10-CM | POA: Diagnosis not present

## 2023-06-22 ENCOUNTER — Ambulatory Visit (HOSPITAL_COMMUNITY): Attending: Cardiovascular Disease

## 2023-06-22 DIAGNOSIS — I447 Left bundle-branch block, unspecified: Secondary | ICD-10-CM | POA: Diagnosis not present

## 2023-06-22 LAB — ECHOCARDIOGRAM COMPLETE
Area-P 1/2: 3.27 cm2
P 1/2 time: 1071 ms
S' Lateral: 3.3 cm

## 2023-06-23 ENCOUNTER — Other Ambulatory Visit: Payer: Self-pay

## 2023-06-23 DIAGNOSIS — I77819 Aortic ectasia, unspecified site: Secondary | ICD-10-CM

## 2023-06-28 DIAGNOSIS — I491 Atrial premature depolarization: Secondary | ICD-10-CM | POA: Diagnosis not present

## 2023-06-28 DIAGNOSIS — I493 Ventricular premature depolarization: Secondary | ICD-10-CM | POA: Diagnosis not present

## 2023-08-31 DIAGNOSIS — H5203 Hypermetropia, bilateral: Secondary | ICD-10-CM | POA: Diagnosis not present

## 2023-08-31 DIAGNOSIS — H2513 Age-related nuclear cataract, bilateral: Secondary | ICD-10-CM | POA: Diagnosis not present

## 2023-09-22 ENCOUNTER — Ambulatory Visit (INDEPENDENT_AMBULATORY_CARE_PROVIDER_SITE_OTHER): Payer: Medicare PPO

## 2023-09-22 VITALS — Ht 66.0 in | Wt 156.0 lb

## 2023-09-22 DIAGNOSIS — Z0000 Encounter for general adult medical examination without abnormal findings: Secondary | ICD-10-CM

## 2023-09-22 NOTE — Progress Notes (Signed)
 Subjective:   [name] is a 83 y.o. who presents for a Medicare Wellness preventive visit.  As a reminder, Annual Wellness Visits don't include a physical exam, and some assessments may be limited, especially if this visit is performed virtually. We may recommend an in-person follow-up visit with your provider if needed.  Visit Complete: Virtual I connected with  [name] on 09/22/23 by a audio enabled telemedicine application and verified that I am speaking with the correct person using two identifiers.  Patient Location: Home  Provider Location: Home Office  I discussed the limitations of evaluation and management by telemedicine. The patient expressed understanding and agreed to proceed.  Vital Signs: Because this visit was a virtual/telehealth visit, some criteria may be missing or patient reported. Any vitals not documented were not able to be obtained and vitals that have been documented are patient reported.  VideoDeclined- This patient declined Librarian, academic. Therefore the visit was completed with audio only.  Persons Participating in Visit: Patient.  AWV Questionnaire: Yes: Patient Medicare AWV questionnaire was completed by the patient on 09/18/2023; I have confirmed that all information answered by patient is correct and no changes since this date.  Cardiac Risk Factors include: advanced age (>104men, >78 women);dyslipidemia;hypertension     Objective:    Today's Vitals   09/22/23 1540  Weight: 156 lb (70.8 kg)  Height: 5' 6 (1.676 m)   Body mass index is 25.18 kg/m.     09/22/2023    3:48 PM 09/16/2022    3:05 PM 07/21/2022    7:37 PM 10/04/2021    2:04 PM 09/21/2020    4:21 PM 03/29/2018   11:30 AM 03/26/2018    1:57 PM  Advanced Directives  Does Patient Have a Medical Advance Directive? Yes Yes Yes Yes Yes Yes  Yes   Type of Estate agent of [place];Living will Healthcare Power of [place];Living  will Healthcare Power of [place];Living will Living will;Healthcare Power of Attorney Living will;Healthcare Power of [employer] Power of [place];Living will Healthcare Power of [place];Living will  Does patient want to make changes to medical advance directive?    No - Patient declined No - Patient declined No - Patient declined  No - Patient declined   Copy of Healthcare Power of Attorney in Chart? No - copy requested No - copy requested  No - copy requested No - copy requested No - copy requested  No - copy requested      Data saved with a previous flowsheet row definition    Current Medications (verified) Outpatient Encounter Medications as of 09/22/2023  Medication Sig   albuterol  (PROAIR  HFA) 108 (90 Base) MCG/ACT inhaler INHALE 1-2 PUFFS INTO THE LUNGS EVERY 4 (FOUR) HOURS AS NEEDED FOR WHEEZING OR SHORTNESS OF BREATH.   amLODipine  (NORVASC ) 5 MG tablet Take 1 tablet (5 mg total) by mouth daily.   cholecalciferol (VITAMIN D ) 1000 units tablet Take 1,000 Units by mouth daily.    levothyroxine  (SYNTHROID ) 50 MCG tablet Take 1 tablet (50 mcg total) by mouth daily before breakfast.   pravastatin  (PRAVACHOL ) 20 MG tablet Take 1 tablet (20 mg total) by mouth daily.   vitamin B-12 (CYANOCOBALAMIN) 500 MCG tablet Take 1,000 mcg by mouth daily.   ciprofloxacin  (CIPRO ) 500 MG tablet Take 1 tablet (500 mg total) by mouth 2 (two) times daily. (Patient not taking: Reported on 09/22/2023)   phenazopyridine (PYRIDIUM) 100 MG tablet Take 100 mg by mouth as needed.  No facility-administered encounter medications on file as of 09/22/2023.    Allergies (verified) Meperidine hcl, Nitrofurantoin, Griseofulvin, and Penicillins   History: Past Medical History:  Diagnosis Date   Chronic hepatitis C (HCC) 2014   DID HARVONI AND RIBIVORIN TX    Headache    MIGRAINE AURA NO HEADACHE   History of blood transfusion 1990   AFTER GI BLEED   Hyperlipidemia    Hypertension    Multinodular goiter     SHRANK AWAY 25 YRS AGO   Osteoarthritis    OA   Palpitations    PACs & PVCs   Pneumonia YRS AGO   Past Surgical History:  Procedure Laterality Date   TOTAL KNEE ARTHROPLASTY Left 04/28/2016   Procedure: LEFT TOTAL KNEE ARTHROPLASTY;  Surgeon: Melodi Lerner, MD;  Location: [facility];  Service: Orthopedics;  Laterality: Left;   TOTAL KNEE ARTHROPLASTY Right 03/29/2018   Procedure: RIGHT TOTAL KNEE ARTHROPLASTY;  Surgeon: Melodi Lerner, MD;  Location: [facility];  Service: Orthopedics;  Laterality: Right;    TUBAL LIGATION     Family History  Problem Relation Age of Onset   Heart disease Mother    Heart disease Father    COPD Father    Social History   Socioeconomic History   Marital status: Married    Spouse name: Jackquline   Number of children: 3   Years of education: Not on file   Highest education level: Doctorate  Occupational History   Occupation: RETIRED  Tobacco Use   Smoking status: Never   Smokeless tobacco: Never  Vaping Use   Vaping status: Never Used  Substance and Sexual Activity   Alcohol use: Yes    Comment: 1 GLASS WINE PER DAY   Drug use: No   Sexual activity: Not on file  Other Topics Concern   Not on file  Social History Narrative   Lives with husband and 1 dog   Social Drivers of Health   Financial Resource Strain: Low Risk  (09/18/2023)   Overall Financial Resource Strain (CARDIA)    Difficulty of Paying Living Expenses: Not hard at all  Food Insecurity: No Food Insecurity (09/18/2023)   Hunger Vital Sign    Worried About Running Out of Food in the Last Year: Never true    Ran Out of Food in the Last Year: Never true  Transportation Needs: No Transportation Needs (09/18/2023)   PRAPARE - Administrator, Civil Service (Medical): No    Lack of Transportation (Non-Medical): No  Physical Activity: Sufficiently Active (09/18/2023)   Exercise Vital Sign    Days of Exercise per Week: 7 days    Minutes of Exercise per Session: 100 min   Stress: No Stress Concern Present (09/18/2023)   [employer] of Occupational Health - Occupational Stress Questionnaire    Feeling of Stress: Not at all  Social Connections: Moderately Integrated (09/18/2023)   Social Connection and Isolation Panel    Frequency of Communication with Friends and Family: Three times a week    Frequency of Social Gatherings with Friends and Family: Once a week    Attends Religious Services: Never    Database administrator or Organizations: Yes    Attends Engineer, structural: More than 4 times per year    Marital Status: Married    Tobacco Counseling Counseling given: Not Answered    Clinical Intake:  Pre-visit preparation completed: Yes  Pain : No/denies pain     BMI - recorded:  25.18 Nutritional Status: BMI 25 -29 Overweight Nutritional Risks: None  Lab Results  Component Value Date   HGBA1C 5.5 01/21/2021     How often do you need to have someone help you when you read instructions, pamphlets, or other written materials from your doctor or pharmacy?: 1 - Never  Interpreter Needed?: No  Information entered by :: Ioan Landini, RMA   Activities of Daily Living     09/22/2023    3:43 PM  In your present state of health, do you have any difficulty performing the following activities:  Hearing? 0  Vision? 0  Difficulty concentrating or making decisions? 0  Walking or climbing stairs? 0  Dressing or bathing? 0  Doing errands, shopping? 0  Preparing Food and eating ? N  Using the Toilet? N  In the past six months, have you accidently leaked urine? Y  Do you have problems with loss of bowel control? N  Managing your Medications? N  Managing your Finances? N  Housekeeping or managing your Housekeeping? N    Patient Care Team: Rollene Almarie LABOR, MD as PCP - General (Internal Medicine) Michele Richardson, DO as PCP - Cardiology (Cardiology) Charmayne Molly, MD as Consulting Physician (Ophthalmology) Debrah Rodgers HERO,  DMD as Consulting Physician (Dentistry)  I have updated your Care Teams any recent Medical Services you may have received from other providers in the past year.     Assessment:   This is a routine wellness examination for [name].  Hearing/Vision screen Hearing Screening - Comments:: Denies hearing difficulties   Vision Screening - Comments:: Wears eyeglasses/ Dr. Charmayne   Goals Addressed             This Visit's Progress    My healthcare goal for 2024 is to stay  healthy.   On track      Depression Screen     09/22/2023    3:52 PM 12/03/2022   10:50 AM 09/16/2022    3:07 PM 11/06/2021    9:02 AM 10/04/2021    2:08 PM 09/21/2020    4:15 PM 09/21/2019   10:24 AM  PHQ 2/9 Scores  PHQ - 2 Score 0 0 0 0 0 0 0  PHQ- 9 Score 2 0 0 0       Fall Risk     09/22/2023    3:49 PM 12/03/2022   10:49 AM 09/16/2022    3:16 PM 09/15/2022   11:14 AM 07/25/2022    3:21 PM  Fall Risk   Falls in the past year? 1 1 1 1  0  Number falls in past yr: 0 0 0 0 0  Injury with Fall? 0 0 0 0 0  Risk for fall due to : Impaired balance/gait  No Fall Risks    Follow up Falls evaluation completed;Falls prevention discussed Falls evaluation completed Falls prevention discussed;Education provided;Falls evaluation completed  Falls evaluation completed    MEDICARE RISK AT HOME:  Medicare Risk at Home Any stairs in or around the home?: Yes If so, are there any without handrails?: No Home free of loose throw rugs in walkways, pet beds, electrical cords, etc?: Yes Adequate lighting in your home to reduce risk of falls?: Yes Life alert?: No Use of a cane, walker or w/c?: No Grab bars in the bathroom?: Yes Shower chair or bench in shower?: Yes Elevated toilet seat or a handicapped toilet?: No  TIMED UP AND GO:  Was the test performed?  No  Cognitive Function: Declined/Normal: No cognitive  concerns noted by patient or family. Patient alert, oriented, able to answer questions appropriately and recall recent  events. No signs of memory loss or confusion.        09/16/2022    3:17 PM 10/04/2021    2:13 PM  6CIT Screen  What Year? 0 points 0 points  What month? 0 points 0 points  What time? 0 points 0 points  Count back from 20 0 points 0 points  Months in reverse 0 points 0 points  Repeat phrase 0 points 0 points  Total Score 0 points 0 points    Immunizations Immunization History  Administered Date(s) Administered   Fluad Quad(high Dose 65+) 12/31/2018, 01/21/2020, 12/28/2020   H1N1 03/03/2008   Hepatitis A, Ped/Adol-2 Dose 05/26/2011   Hepatitis B, PED/ADOLESCENT 05/26/2011   Influenza Split 01/16/2005, 01/08/2009, 01/28/2011, 12/31/2012, 01/23/2016   Influenza,inj,Quad PF,6+ Mos 12/16/2016   Influenza,inj,quad, With Preservative 12/20/2013, 02/06/2015   Influenza-Unspecified 01/20/2018   PFIZER Comirnaty(Gray Top)Covid-19 Tri-Sucrose Vaccine 12/12/2021   PFIZER(Purple Top)SARS-COV-2 Vaccination 04/06/2019, 04/26/2019, 12/20/2019, 06/21/2020, 12/04/2020   PPD Test 12/06/2004   Pfizer Covid-19 Vaccine Bivalent Booster 36yrs & up 08/27/2021   Pneumococcal Conjugate-13 03/24/2013, 12/20/2013   Pneumococcal Polysaccharide-23 03/25/2007, 03/03/2008, 09/21/2020   Td 11/08/1999   Tdap 03/24/2009, 05/25/2009, 02/07/2022   Zoster Recombinant(Shingrix) 07/23/2017, 09/23/2017   Zoster, Live 03/24/2010, 11/20/2010   Zoster, Unspecified 10/21/2017, 05/04/2018    Screening Tests Health Maintenance  Topic Date Due   DEXA SCAN  Never done   Hepatitis B Vaccines (2 of 3 - 19+ 3-dose series) 06/23/2011   COVID-19 Vaccine (8 - 2024-25 season) 11/23/2022   INFLUENZA VACCINE  10/23/2023   Medicare Annual Wellness (AWV)  09/21/2024   DTaP/Tdap/Td (5 - Td or Tdap) 02/08/2032   Pneumococcal Vaccine: 50+ Years  Completed   Zoster Vaccines- Shingrix  Completed   HPV VACCINES  Aged Out   Meningococcal B Vaccine  Aged Out   Colonoscopy  Discontinued   Hepatitis C Screening  Discontinued     Health Maintenance  Health Maintenance Due  Topic Date Due   DEXA SCAN  Never done   Hepatitis B Vaccines (2 of 3 - 19+ 3-dose series) 06/23/2011   COVID-19 Vaccine (8 - 2024-25 season) 11/23/2022   Health Maintenance Items Addressed: See Nurse Notes at the end of this note  Additional Screening:  Vision Screening: Recommended annual ophthalmology exams for early detection of glaucoma and other disorders of the eye. Would you like a referral to an eye doctor? No    Dental Screening: Recommended annual dental exams for proper oral hygiene  Community Resource Referral / Chronic Care Management: CRR required this visit?  No   CCM required this visit?  No   Plan:    I have personally reviewed and noted the following in the patient's chart:   Medical and social history Use of alcohol, tobacco or illicit drugs  Current medications and supplements including opioid prescriptions. Patient is not currently taking opioid prescriptions. Functional ability and status Nutritional status Physical activity Advanced directives List of other physicians Hospitalizations, surgeries, and ER visits in previous 12 months Vitals Screenings to include cognitive, depression, and falls Referrals and appointments  In addition, I have reviewed and discussed with patient certain preventive protocols, quality metrics, and best practice recommendations. A written personalized care plan for preventive services as well as general preventive health recommendations were provided to patient.   Cosette Prindle L Kaidin Boehle, CMA   09/22/2023   After Visit Summary: (MyChart) Due  to this being a telephonic visit, the after visit summary with patients personalized plan was offered to patient via MyChart   Notes: Patient is due for a DEXA, however, she declines it.  Patient is due for a Hep b vaccine.  She stated that she has had them, however, there is only one out of the 3 doses that has been recorded in [facility].   Patient had no other concerns to address today.

## 2023-09-22 NOTE — Patient Instructions (Signed)
 [name] , Thank you for taking time out of your busy schedule to complete your Annual Wellness Visit with me. I enjoyed our conversation and look forward to speaking with you again next year. I, as well as your care team,  appreciate your ongoing commitment to your health goals. Please review the following plan we discussed and let me know if I can assist you in the future. Your Game plan/ To Do List   Follow up Visits: Next Medicare AWV with our clinical staff: 09/26/2024.   Have you seen your provider in the last 6 months (3 months if uncontrolled diabetes)? Yes Next Office Visit with your provider: 01/21/2024.  Clinician Recommendations:  Aim for 30 minutes of exercise or brisk walking, 6-8 glasses of water , and 5 servings of fruits and vegetables each day. Please discuss the Hepatitis B vaccine with PCP during your next office visit.      This is a list of the screening recommended for you and due dates:  Health Maintenance  Topic Date Due   DEXA scan (bone density measurement)  Never done   Hepatitis B Vaccine (2 of 3 - 19+ 3-dose series) 06/23/2011   COVID-19 Vaccine (8 - 2024-25 season) 11/23/2022   Flu Shot  10/23/2023   Medicare Annual Wellness Visit  09/21/2024   DTaP/Tdap/Td vaccine (5 - Td or Tdap) 02/08/2032   Pneumococcal Vaccine for age over 15  Completed   Zoster (Shingles) Vaccine  Completed   HPV Vaccine  Aged Out   Meningitis B Vaccine  Aged Out   Colon Cancer Screening  Discontinued   Hepatitis C Screening  Discontinued    Advanced directives: (Copy Requested) Please bring a copy of your health care power of attorney and living will to the office to be added to your chart at your convenience. You can mail to [facility] [address] [place], [place] [address] or email to [email or web address] Advance Care Planning is important because it:  [x]  Makes sure you receive the medical care that is consistent with your values, goals, and  preferences  [x]  It provides guidance to your family and loved ones and reduces their decisional burden about whether or not they are making the right decisions based on your wishes.  Follow the link provided in your after visit summary or read over the paperwork we have mailed to you to help you started getting your Advance Directives in place. If you need assistance in completing these, please reach out to us  so that we can help you!  See attachments for Preventive Care and Fall Prevention Tips.

## 2023-11-30 ENCOUNTER — Encounter: Payer: Self-pay | Admitting: Internal Medicine

## 2023-12-01 DIAGNOSIS — Z23 Encounter for immunization: Secondary | ICD-10-CM | POA: Diagnosis not present

## 2023-12-03 ENCOUNTER — Encounter: Payer: Medicare PPO | Admitting: Internal Medicine

## 2023-12-08 ENCOUNTER — Ambulatory Visit: Payer: Self-pay | Admitting: Internal Medicine

## 2023-12-08 ENCOUNTER — Ambulatory Visit (INDEPENDENT_AMBULATORY_CARE_PROVIDER_SITE_OTHER): Admitting: Internal Medicine

## 2023-12-08 ENCOUNTER — Encounter: Payer: Self-pay | Admitting: Internal Medicine

## 2023-12-08 VITALS — BP 122/80 | HR 58 | Temp 98.1°F | Ht 66.0 in | Wt 155.0 lb

## 2023-12-08 DIAGNOSIS — I10 Essential (primary) hypertension: Secondary | ICD-10-CM

## 2023-12-08 DIAGNOSIS — Z0000 Encounter for general adult medical examination without abnormal findings: Secondary | ICD-10-CM | POA: Diagnosis not present

## 2023-12-08 DIAGNOSIS — E782 Mixed hyperlipidemia: Secondary | ICD-10-CM | POA: Diagnosis not present

## 2023-12-08 DIAGNOSIS — E042 Nontoxic multinodular goiter: Secondary | ICD-10-CM

## 2023-12-08 LAB — LIPID PANEL
Cholesterol: 172 mg/dL (ref 0–200)
HDL: 52.1 mg/dL (ref >39.00)
LDL Cholesterol: 99 mg/dL (ref 0–99)
NonHDL: 119.83
Total CHOL/HDL Ratio: 3
Triglycerides: 103 mg/dL (ref 0.0–149.0)
VLDL: 20.6 mg/dL (ref 0.0–40.0)

## 2023-12-08 LAB — CBC
HCT: 39 % (ref 36.0–46.0)
Hemoglobin: 12.9 g/dL (ref 12.0–15.0)
MCHC: 33.1 g/dL (ref 30.0–36.0)
MCV: 88.7 fl (ref 78.0–100.0)
Platelets: 171 K/uL (ref 150.0–400.0)
RBC: 4.4 Mil/uL (ref 3.87–5.11)
RDW: 14.1 % (ref 11.5–15.5)
WBC: 4 K/uL (ref 4.0–10.5)

## 2023-12-08 LAB — COMPREHENSIVE METABOLIC PANEL WITH GFR
ALT: 12 U/L (ref 0–35)
AST: 16 U/L (ref 0–37)
Albumin: 4.4 g/dL (ref 3.5–5.2)
Alkaline Phosphatase: 76 U/L (ref 39–117)
BUN: 17 mg/dL (ref 6–23)
CO2: 27 meq/L (ref 19–32)
Calcium: 9.3 mg/dL (ref 8.4–10.5)
Chloride: 108 meq/L (ref 96–112)
Creatinine, Ser: 0.68 mg/dL (ref 0.40–1.20)
GFR: 81.95 mL/min (ref >60.00)
Glucose, Bld: 93 mg/dL (ref 70–99)
Potassium: 3.4 meq/L — ABNORMAL LOW (ref 3.5–5.1)
Sodium: 135 meq/L (ref 135–145)
Total Bilirubin: 0.7 mg/dL (ref 0.2–1.2)
Total Protein: 6.6 g/dL (ref 6.0–8.3)

## 2023-12-08 LAB — VITAMIN D 25 HYDROXY (VIT D DEFICIENCY, FRACTURES): VITD: 32.82 ng/mL (ref 30.00–100.00)

## 2023-12-08 LAB — TSH: TSH: 1.51 u[IU]/mL (ref 0.35–5.50)

## 2023-12-08 LAB — VITAMIN B12: Vitamin B-12: 799 pg/mL (ref 211–911)

## 2023-12-08 MED ORDER — AMLODIPINE BESYLATE 5 MG PO TABS: 5.0000 mg | ORAL_TABLET | Freq: Every day | ORAL | 3 refills | Status: AC

## 2023-12-08 MED ORDER — LEVOTHYROXINE SODIUM 50 MCG PO TABS: 50.0000 ug | ORAL_TABLET | Freq: Every day | ORAL | 3 refills | Status: AC

## 2023-12-08 MED ORDER — PRAVASTATIN SODIUM 20 MG PO TABS: 20.0000 mg | ORAL_TABLET | Freq: Every day | ORAL | 3 refills | Status: AC

## 2023-12-08 NOTE — Progress Notes (Signed)
 Subjective:   Patient ID: [name], female    DOB: 1940-09-11, 83 y.o.   MRN: [id number]  The patient is here for physical. Pertinent topics discussed: Discussed the use of AI scribe software for clinical note transcription with the patient, who gave verbal consent to proceed.  History of Present Illness [name] is an 83 year old female with restless leg syndrome and neuropathy who presents with sleep disturbances.  She experiences symptoms consistent with restless leg syndrome and neuropathy, primarily affecting her lower legs and feet. A burning sensation sometimes extends to the top of her legs, interfering with her sleep. Although she generally falls asleep without issue, she often wakes up needing to use the bathroom, at which point the symptoms become noticeable and prevent her from returning to sleep.  The symptoms are not intense but are distracting enough to keep her awake. On nights when she cannot sleep, she resorts to reading or listening to [employer]. The lack of sleep leads to daytime fatigue, making her feel as though she is 'walking through a wall of fatigue.'  She has been taking vitamin B supplements on the advice of her daughter, but she is unsure if it has made any difference. She has stopped drinking wine and limits her caffeine intake to one cup of caffeinated tea in the morning. She avoids drinking fluids after 8 PM to reduce nighttime bathroom trips.  She maintains an active lifestyle, walking almost four miles daily and riding an exercise bike. She has no back issues and is pleased with her exercise capacity. She has a history of arthritis in her hips and low back, but these do not currently bother her.  She has a history of widening of the aorta, reported to be 42 millimeters, and is scheduled for a follow-up CT scan next year   PMH, [place], social history reviewed and updated  Review of Systems  Constitutional: Negative.   HENT: Negative.    Eyes:  Negative.   Respiratory:  Negative for cough, chest tightness and shortness of breath.   Cardiovascular:  Negative for chest pain, palpitations and leg swelling.  Gastrointestinal:  Negative for abdominal distention, abdominal pain, constipation, diarrhea, nausea and vomiting.  Musculoskeletal: Negative.   Skin: Negative.   Neurological: Negative.        Tingling in feet  Psychiatric/Behavioral: Negative.      Objective:  Physical Exam Constitutional:      Appearance: She is well-developed.  HENT:     Head: Normocephalic and atraumatic.  Cardiovascular:     Rate and Rhythm: Normal rate and regular rhythm.  Pulmonary:     Effort: Pulmonary effort is normal. No respiratory distress.     Breath sounds: Normal breath sounds. No wheezing or rales.  Abdominal:     General: Bowel sounds are normal. There is no distension.     Palpations: Abdomen is soft.     Tenderness: There is no abdominal tenderness.  Musculoskeletal:     Cervical back: Normal range of motion.  Skin:    General: Skin is warm and dry.  Neurological:     Mental Status: She is alert and oriented to person, place, and time.     Coordination: Coordination normal.     Vitals:   12/08/23 1337  BP: 122/80  Pulse: (!) 58  Temp: 98.1 F (36.7 C)  TempSrc: Oral  SpO2: 97%  Weight: 155 lb (70.3 kg)  Height: 5' 6 (1.676 m)    Assessment &  Plan:

## 2023-12-08 NOTE — Assessment & Plan Note (Signed)
 Checking lipid panel and adjust pravastatin as needed.

## 2023-12-08 NOTE — Assessment & Plan Note (Signed)
 Checking TSH and adjust levothyroxine  50 mcg daily.

## 2023-12-08 NOTE — Assessment & Plan Note (Signed)
 BP at goal checking CMP and adjust as needed.

## 2023-12-08 NOTE — Patient Instructions (Signed)
 Capsaicin cream you can get over the counter to try.

## 2023-12-08 NOTE — Assessment & Plan Note (Signed)
 Flu shot yearly. Pneumonia complete. Shingrix complete. Tetanus up to date. Colonoscopy aged out. Mammogram aged out, pap smear aged out and dexa complete. Counseled about sun safety and mole surveillance. Counseled about the dangers of distracted driving. Given 10 year screening recommendations.

## 2023-12-17 DIAGNOSIS — Z85828 Personal history of other malignant neoplasm of skin: Secondary | ICD-10-CM | POA: Diagnosis not present

## 2023-12-17 DIAGNOSIS — L570 Actinic keratosis: Secondary | ICD-10-CM | POA: Diagnosis not present

## 2023-12-17 DIAGNOSIS — L820 Inflamed seborrheic keratosis: Secondary | ICD-10-CM | POA: Diagnosis not present

## 2024-05-23 ENCOUNTER — Ambulatory Visit (HOSPITAL_COMMUNITY)

## 2024-06-14 ENCOUNTER — Ambulatory Visit: Admitting: Cardiology

## 2024-09-26 ENCOUNTER — Ambulatory Visit
